# Patient Record
Sex: Female | Born: 1935 | Race: White | Hispanic: No | Smoking: Former smoker
Health system: Southern US, Community
[De-identification: ages and names within clinical notes are randomized; demographics above are authoritative.]

## PROBLEM LIST (undated history)

## (undated) DIAGNOSIS — K219 Gastro-esophageal reflux disease without esophagitis: Secondary | ICD-10-CM

## (undated) DIAGNOSIS — E785 Hyperlipidemia, unspecified: Secondary | ICD-10-CM

## (undated) DIAGNOSIS — M25469 Effusion, unspecified knee: Secondary | ICD-10-CM

## (undated) DIAGNOSIS — G4730 Sleep apnea, unspecified: Secondary | ICD-10-CM

## (undated) DIAGNOSIS — I10 Essential (primary) hypertension: Secondary | ICD-10-CM

## (undated) HISTORY — DX: Gastro-esophageal reflux disease without esophagitis: K21.9

## (undated) HISTORY — PX: CATARACT EXTRACTION: SUR2

## (undated) HISTORY — PX: CARPAL TUNNEL RELEASE: SHX101

## (undated) HISTORY — DX: Effusion, unspecified knee: M25.469

## (undated) HISTORY — DX: Sleep apnea, unspecified: G47.30

## (undated) HISTORY — DX: Hyperlipidemia, unspecified: E78.5

## (undated) HISTORY — DX: Essential (primary) hypertension: I10

---

## 2007-11-04 LAB — HM COLONOSCOPY

## 2009-10-07 ENCOUNTER — Ambulatory Visit: Payer: Self-pay | Admitting: Internal Medicine

## 2010-10-16 ENCOUNTER — Ambulatory Visit: Payer: Self-pay | Admitting: Internal Medicine

## 2011-03-25 ENCOUNTER — Ambulatory Visit: Payer: Self-pay | Admitting: Internal Medicine

## 2011-07-07 HISTORY — PX: TOTAL SHOULDER REPLACEMENT: SUR1217

## 2011-07-07 HISTORY — PX: CHOLECYSTECTOMY: SHX55

## 2012-03-22 ENCOUNTER — Ambulatory Visit: Payer: Self-pay | Admitting: Internal Medicine

## 2012-08-25 ENCOUNTER — Ambulatory Visit: Payer: Self-pay | Admitting: Internal Medicine

## 2013-03-24 ENCOUNTER — Ambulatory Visit: Payer: Self-pay | Admitting: Internal Medicine

## 2013-10-31 ENCOUNTER — Ambulatory Visit: Payer: Self-pay | Admitting: Internal Medicine

## 2014-03-20 LAB — LIPID PANEL
Cholesterol: 204 mg/dL — AB (ref 0–200)
HDL: 50 mg/dL (ref 35–70)
LDL Cholesterol: 111 mg/dL
Triglycerides: 213 mg/dL — AB (ref 40–160)

## 2014-03-20 LAB — TSH: TSH: 2 u[IU]/mL (ref <=5.90)

## 2014-03-20 LAB — CBC AND DIFFERENTIAL: Hemoglobin: 12.5 g/dL (ref 12.0–16.0)

## 2014-04-05 LAB — HM MAMMOGRAPHY: HM MAMMO: NORMAL

## 2014-04-12 ENCOUNTER — Ambulatory Visit: Payer: Self-pay | Admitting: Internal Medicine

## 2015-01-24 ENCOUNTER — Encounter: Payer: Self-pay | Admitting: Internal Medicine

## 2015-01-24 DIAGNOSIS — E210 Primary hyperparathyroidism: Secondary | ICD-10-CM | POA: Insufficient documentation

## 2015-01-24 DIAGNOSIS — I10 Essential (primary) hypertension: Secondary | ICD-10-CM | POA: Insufficient documentation

## 2015-01-24 DIAGNOSIS — M810 Age-related osteoporosis without current pathological fracture: Secondary | ICD-10-CM | POA: Insufficient documentation

## 2015-01-24 DIAGNOSIS — E782 Mixed hyperlipidemia: Secondary | ICD-10-CM | POA: Insufficient documentation

## 2015-01-24 DIAGNOSIS — R197 Diarrhea, unspecified: Secondary | ICD-10-CM | POA: Insufficient documentation

## 2015-01-24 DIAGNOSIS — K219 Gastro-esophageal reflux disease without esophagitis: Secondary | ICD-10-CM | POA: Insufficient documentation

## 2015-01-24 DIAGNOSIS — M159 Polyosteoarthritis, unspecified: Secondary | ICD-10-CM | POA: Insufficient documentation

## 2015-01-24 DIAGNOSIS — E559 Vitamin D deficiency, unspecified: Secondary | ICD-10-CM | POA: Insufficient documentation

## 2015-01-24 DIAGNOSIS — J309 Allergic rhinitis, unspecified: Secondary | ICD-10-CM | POA: Insufficient documentation

## 2015-01-25 ENCOUNTER — Ambulatory Visit (INDEPENDENT_AMBULATORY_CARE_PROVIDER_SITE_OTHER): Payer: Medicare Other | Admitting: Internal Medicine

## 2015-01-25 ENCOUNTER — Encounter: Payer: Self-pay | Admitting: Internal Medicine

## 2015-01-25 VITALS — BP 98/60 | HR 67 | Temp 98.3°F | Ht 67.0 in | Wt 180.0 lb

## 2015-01-25 DIAGNOSIS — J029 Acute pharyngitis, unspecified: Secondary | ICD-10-CM | POA: Diagnosis not present

## 2015-01-25 MED ORDER — AZITHROMYCIN 250 MG PO TABS: 250.0000 mg | ORAL_TABLET | Freq: Every day | ORAL | Status: DC

## 2015-01-25 NOTE — Progress Notes (Signed)
Date:  01/25/2015   Name:  Joanna Sanders   DOB:  11-25-1929   MRN:  [id number]   Chief Complaint: Sore Throat Sore Throat  This is a new problem. The current episode started in the past 7 days. The problem has been gradually worsening. Neither side of throat is experiencing more pain than the other. There has been no fever. The patient is experiencing no pain. Associated symptoms include congestion, coughing, headaches, a hoarse voice and shortness of breath. Pertinent negatives include no abdominal pain, diarrhea, ear discharge, ear pain, neck pain, swollen glands or trouble swallowing. She has tried acetaminophen for the symptoms. The treatment provided mild relief.     Review of Systems:  Review of Systems  Constitutional: Positive for fever. Negative for appetite change and fatigue.  HENT: Positive for congestion, hoarse voice, postnasal drip, sinus pressure and sore throat. Negative for ear discharge, ear pain, sneezing, tinnitus and trouble swallowing.   Eyes: Negative for visual disturbance.  Respiratory: Positive for cough and shortness of breath. Negative for choking and chest tightness.   Cardiovascular: Negative for chest pain.  Gastrointestinal: Negative for abdominal pain and diarrhea.  Musculoskeletal: Negative for neck pain.  Neurological: Positive for headaches.    Patient Active Problem List   Diagnosis Date Noted  . Allergic rhinitis 01/24/2015  . Benign essential HTN 01/24/2015  . HLD (hyperlipidemia) 01/24/2015  . Acid reflux 01/24/2015  . Generalized OA 01/24/2015  . Hyperparathyroidism, primary 01/24/2015  . D (diarrhea) 01/24/2015  . OP (osteoporosis) 01/24/2015  . Avitaminosis D 01/24/2015    Prior to Admission medications   Medication Sig Start Date End Date Taking? Authorizing Provider  alendronate (FOSAMAX) 70 MG tablet Take 1 tablet by mouth once a week. 11/14/14  Yes Historical Provider, MD  cholecalciferol (VITAMIN D) 1000 UNITS tablet Take 1  tablet by mouth daily.   Yes Historical Provider, MD  cholestyramine light (PREVALITE) 4 G packet Take 1 packet by mouth daily. 11/14/14  Yes Historical Provider, MD  dicyclomine (BENTYL) 10 MG capsule Take 1 capsule by mouth 3 (three) times daily. 06/22/14  Yes Historical Provider, MD  Esomeprazole Magnesium 20 MG TBEC Take 1 tablet by mouth daily.   Yes Historical Provider, MD  furosemide (LASIX) 20 MG tablet Take 1 tablet by mouth daily. 03/04/14  Yes Historical Provider, MD    Allergies  Allergen Reactions  . Hydrochlorothiazide     hypercalcemia  . Prednisone Other (See Comments)    Gets really hyper    Past Surgical History  Procedure Laterality Date  . Cholecystectomy  2013  . Carpal tunnel release Right   . Total shoulder replacement Left 2013  . Cataract extraction      History  Substance Use Topics  . Smoking status: Former Research scientist (life sciences)  . Smokeless tobacco: Not on file  . Alcohol Use: No     Medication list has been reviewed and updated.  Physical Examination:  Physical Exam  Constitutional: She appears well-developed and well-nourished.  HENT:  Right Ear: Tympanic membrane and ear canal normal.  Left Ear: Tympanic membrane and ear canal normal.  Nose: Right sinus exhibits maxillary sinus tenderness. Left sinus exhibits maxillary sinus tenderness.  Mouth/Throat: Uvula is midline. Posterior oropharyngeal erythema present. No oropharyngeal exudate or posterior oropharyngeal edema.  Cardiovascular: Normal rate, regular rhythm and S1 normal.   Pulmonary/Chest: Effort normal and breath sounds normal.  Psychiatric: She has a normal mood and affect.    BP 98/60 mmHg  Pulse 67  Temp(Src) 98.3 F (36.8 C)  Ht 5\' 7"  (1.702 m)  Wt 180 lb (81.647 kg)  BMI 28.19 kg/m2  SpO2 97%  Assessment and Plan: 1. Pharyngitis Take claritin or allegra for 5-10 days for PND - azithromycin (ZITHROMAX Z-PAK) 250 MG tablet; Take 1 tablet (250 mg total) by mouth daily. Take 2 tabs on  day #1 the 2 tablet daily until completed.  Dispense: 6 each; Refill: 0   Halina Maidens, MD [facility]  01/25/2015

## 2015-01-25 NOTE — Patient Instructions (Signed)
Get Claritin 10mg (Loratidine) or Allegra 180 mg(fexofenadine) - and take one a day for 5-10 days

## 2015-01-30 ENCOUNTER — Other Ambulatory Visit: Payer: Self-pay | Admitting: Internal Medicine

## 2015-01-30 MED ORDER — ALBUTEROL SULFATE HFA 108 (90 BASE) MCG/ACT IN AERS: 2.0000 | INHALATION_SPRAY | Freq: Four times a day (QID) | RESPIRATORY_TRACT | Status: DC | PRN

## 2015-01-30 MED ORDER — LEVOFLOXACIN 500 MG PO TABS: 500.0000 mg | ORAL_TABLET | Freq: Every day | ORAL | Status: DC

## 2015-03-12 ENCOUNTER — Other Ambulatory Visit: Payer: Self-pay | Admitting: Internal Medicine

## 2015-03-12 MED ORDER — MECLIZINE HCL 25 MG PO TABS: 25.0000 mg | ORAL_TABLET | Freq: Three times a day (TID) | ORAL | Status: DC | PRN

## 2015-03-12 MED ORDER — ONDANSETRON HCL 4 MG PO TABS: 4.0000 mg | ORAL_TABLET | Freq: Three times a day (TID) | ORAL | Status: DC | PRN

## 2015-03-19 ENCOUNTER — Other Ambulatory Visit: Payer: Self-pay | Admitting: Internal Medicine

## 2015-04-04 ENCOUNTER — Encounter: Payer: Self-pay | Admitting: Internal Medicine

## 2015-04-04 ENCOUNTER — Ambulatory Visit (INDEPENDENT_AMBULATORY_CARE_PROVIDER_SITE_OTHER): Payer: Medicare Other | Admitting: Internal Medicine

## 2015-04-04 VITALS — BP 132/70 | HR 80 | Ht 66.75 in | Wt 174.6 lb

## 2015-04-04 DIAGNOSIS — R197 Diarrhea, unspecified: Secondary | ICD-10-CM | POA: Diagnosis not present

## 2015-04-04 DIAGNOSIS — K862 Cyst of pancreas: Secondary | ICD-10-CM

## 2015-04-04 DIAGNOSIS — Z1239 Encounter for other screening for malignant neoplasm of breast: Secondary | ICD-10-CM | POA: Diagnosis not present

## 2015-04-04 DIAGNOSIS — R079 Chest pain, unspecified: Secondary | ICD-10-CM | POA: Diagnosis not present

## 2015-04-04 DIAGNOSIS — R1084 Generalized abdominal pain: Secondary | ICD-10-CM

## 2015-04-04 DIAGNOSIS — I10 Essential (primary) hypertension: Secondary | ICD-10-CM

## 2015-04-04 DIAGNOSIS — E785 Hyperlipidemia, unspecified: Secondary | ICD-10-CM

## 2015-04-04 DIAGNOSIS — Z0000 Encounter for general adult medical examination without abnormal findings: Secondary | ICD-10-CM

## 2015-04-04 NOTE — Progress Notes (Signed)
Patient: Joanna Sanders, Female    DOB: [birth year], 81 y.o.   MRN: [id number] Visit Date: 04/04/2015  Today's Provider: Halina Maidens, MD   Chief Complaint  Patient presents with  . Medicare Wellness  . Headache  . Hyperlipidemia   Subjective:    Annual wellness visit Joanna Sanders is a 81 y.o. female who presents today for her Subsequent Annual Wellness Visit. She feels fairly well. She reports exercising intermittently. She reports she is sleeping well.   ----------------------------------------------------------- Chest Pain  This is a recurrent problem. The problem occurs intermittently. The problem has been unchanged. The pain is present in the substernal region. The pain is at a severity of 0/10. The pain is mild. The quality of the pain is described as pressure. The pain does not radiate. Associated symptoms include abdominal pain and dizziness ( Intermittent vertigo). Pertinent negatives include no cough, diaphoresis, fever, headaches, nausea, numbness, palpitations, shortness of breath or vomiting. The pain is aggravated by nothing. Past medical history comments: CT scan 4 years ago showed pancreatic duct system was never followed up upon  Abdominal Pain This is a recurrent problem. The current episode started more than 1 year ago. The problem occurs every several days. The problem has been unchanged. The pain is located in the generalized abdominal region (Concentrated on the right upper quadrant). The quality of the pain is cramping and a sensation of fullness. Associated symptoms include diarrhea. Pertinent negatives include no arthralgias, dysuria, fever, headaches, hematuria, myalgias, nausea, vomiting or weight loss. Nothing aggravates the pain. The pain is relieved by nothing. Prior workup: Colonoscopy is scheduled for November. Her past medical history is significant for abdominal surgery. CT scan 4 years ago showed pancreatic duct system was never followed up upon                                 Verified   CT abdomen and pelvis with IV contrast, routine  Comparison:  None.  Indication:  NOT SPECIFIED; IV [id number] BACK UP [id number].  Technique:  CT imaging was performed of the abdomen and pelvis following the uncomplicated administration of intravenous contrast (Isovue-300, 150 mL at 3 mL/sec).  Iodinated contrast was used due to the indications for the examination.   If IV contrast material had not been administered, the likelihood of detecting abnormalities relevant to the patients condition would have been substantially decreased.  The most recent serum creatinine is 0.7 mg/dL.   Coronal reformatted images were generated and reviewed to improve anatomic localization and optimize lesion detection.  Findings: Mild dependent opacities are seen at the lung bases, likely representing atelectasis. The heart is normal in size. No pericardial effusion is seen. There is a small hiatal hernia noted.  The liver demonstrates a normal size and contour. There is a small amount of focal fat noted along the falciform ligament. Subtle hyperenhancement in the dome of the liver, best seen on coronal imaging, is likely perfusional. No focal hepatic lesions are identified. The portal and hepatic veins are patent.  The spleen, adrenals, and kidneys are unremarkable. There is no evidence of hydronephrosis. A pancreatic divisum versus dilated accessory pancreatic duct is noted. No cystic lesion identified.  The aorta, celiac, SMA and IMA are patent and of normal caliber. Mild to moderate atheromatous plaque is identified along the course of the infrarenal abdominal aorta. The SMV and splenic vein are patent. No free fluid, free air, or pathologically enlarged  adenopathy is identified.  The small bowel demonstrates a normal caliber and is without evidence of bowel obstruction. The appendix is visualized in the right lower quadrant and is within normal limits.  Scattered colonic diverticula are identified. There is short-segment inflammatory stranding surrounding the proximal sigmoid/distal descending colon, concerning for diverticulitis. There is no evidence of extraluminal free air. The loculation of gas on image 58 is felt to be within a diverticulum. Additionally, there is no evidence of an adjacent abscess.  The uterus and ovaries are unremarkable. No adnexal masses are seen. Calcified phleboliths are noted in the pelvis. The bladder is partially distended.  Mild multilevel degenerative changes are noted throughout the spine.  Impression:  1. Findings compatible with uncomplicated, short segment diverticulitis of the proximal sigmoid/distal descending colon. No evidence of associated perforation or abscess formation. Recommend colonoscopy following clinical treatment of the diverticulitis to ensure absence of underlying colonic mass.  2. Pancreatic divisum versus dilated accessory pancreatic duct. MRCP may be obtained for further evaluation as clinically warranted.    The above was discussed with Dr. Araceli Bouche at 5:33 PM by Dr. Kathrynn Running.  The preliminary report (critical or emergent communication) was reviewed prior to this dictation and there are no substantial differences between the preliminary results and the impressions in this final report.  I have reviewed the images and concur with the above findings.  Electronically Reviewed by:  Mathis Bud, MD Electronically Reviewed on:  06/14/2011 8:56 AM  Electronically Signed by:  Salome Holmes, MD Electronically Signed on:  06/14/2011 11:33 AM    Result ID: [id number]   Review of Systems  Constitutional: Negative for fever, chills, weight loss, diaphoresis and fatigue.  HENT: Negative for hearing loss, sore throat and trouble swallowing.   Eyes: Negative for visual disturbance.  Respiratory: Negative for cough, shortness of breath and wheezing.   Cardiovascular: Positive  for chest pain. Negative for palpitations and leg swelling.  Gastrointestinal: Positive for abdominal pain and diarrhea. Negative for nausea, vomiting and blood in stool.  Genitourinary: Negative for dysuria and hematuria.  Musculoskeletal: Negative for myalgias, joint swelling and arthralgias.  Skin: Negative for color change and rash.  Neurological: Positive for dizziness ( Intermittent vertigo). Negative for tremors, syncope, light-headedness, numbness and headaches.  Hematological: Negative for adenopathy.  Psychiatric/Behavioral: Negative for sleep disturbance, dysphoric mood and decreased concentration. The patient is not nervous/anxious.     Social History   Social History  . Marital Status: Widowed    Spouse Name: N/A  . Number of Children: N/A  . Years of Education: N/A   Occupational History  . Not on file.   Social History Main Topics  . Smoking status: Former Research scientist (life sciences)  . Smokeless tobacco: Not on file  . Alcohol Use: No  . Drug Use: No  . Sexual Activity: Not on file   Other Topics Concern  . Not on file   Social History Narrative    Patient Active Problem List   Diagnosis Date Noted  . Allergic rhinitis 01/24/2015  . Benign essential HTN 01/24/2015  . HLD (hyperlipidemia) 01/24/2015  . Acid reflux 01/24/2015  . Generalized OA 01/24/2015  . Hyperparathyroidism, primary 01/24/2015  . D (diarrhea) 01/24/2015  . OP (osteoporosis) 01/24/2015  . Avitaminosis D 01/24/2015    Past Surgical History  Procedure Laterality Date  . Cholecystectomy  2013  . Carpal tunnel release Right   . Total shoulder replacement Left 2013  . Cataract extraction      Her family history includes  Heart failure in her father; Hypertension in her mother.    Previous Medications   ALBUTEROL (PROVENTIL HFA;VENTOLIN HFA) 108 (90 BASE) MCG/ACT INHALER    Inhale 2 puffs into the lungs every 6 (six) hours as needed for wheezing or shortness of breath.   ALENDRONATE (FOSAMAX) 70 MG  TABLET    Take 1 tablet by mouth once a week.   CHOLECALCIFEROL (VITAMIN D) 1000 UNITS TABLET    Take 1 tablet by mouth daily.   CHOLESTYRAMINE LIGHT (PREVALITE) 4 G PACKET    Take 1 packet by mouth daily.   DICYCLOMINE (BENTYL) 10 MG CAPSULE    Take 1 capsule by mouth 3 (three) times daily.   ESOMEPRAZOLE MAGNESIUM 20 MG TBEC    Take 1 tablet by mouth daily.   FUROSEMIDE (LASIX) 20 MG TABLET    TAKE 1 TABLET BY MOUTH DAILY.   MECLIZINE (ANTIVERT) 25 MG TABLET    Take 1 tablet (25 mg total) by mouth 3 (three) times daily as needed.   ONDANSETRON (ZOFRAN) 4 MG TABLET    Take 1 tablet (4 mg total) by mouth every 8 (eight) hours as needed for nausea or vomiting.   [place]  Component Name Value Range  Sodium 139 135 - 145 mmol/L  Potassium 4.2 3.5 - 5.0 mmol/L  Chloride 103 98 - 108 mmol/L  Carbon Dioxide (CO2) 27 21 - 30 mmol/L  Urea Nitrogen (BUN) 9 7 - 20 mg/dL  Creatinine 0.7 0.4 - 1.0 mg/dL  Calcium 10.6 (H) 8.7 - 10.2 mg/dL  Phosphorus 2.5 2.3 - 4.5 mg/dL  Albumin 4.1 3.5 - 4.8 g/dL  Glomerular Filtration Rate (eGFR), MDRD Estimate >60 Comment:  Interpretive Ranges for Patients with Chronic Kidney Disease:  eGFR:      > 60 mL/min - Normal eGFR:      30 - 59 mL/min - Moderately Decreased eGFR:      15 - 29 mL/min - Severely Decreased eGFR:      < 15 mL/min -  Kidney Failure  Note: These GFR calculations do not apply in acute situations  when GFR is changing rapidly or in patients on dialysis. mL/min/1.73sq m   Glucose 99 Comment:  Interpretive Data:  Please note that the above listed reference range                    is for NONFASTING GLUCOSE levels only.  FASTING GLUCOSE                        reference ranges are shown below:                                              FASTING GLUCOSE REFERENCE RANGE                                      NORMAL:             70 - 99  mg/dL                                   PREDIABETES:       100 - 125 mg/dL  DIABETES:          > 125     mg/dL                                                                                                                                                                                                       70 - 140 mg/dL  Anion Gap 9 3 - 12 mmol/L  BUN/CREA Ratio 13     Patient Care Team: Glean Hess, MD as PCP - General (Internal Medicine) Farley Ly, MD as Referring Physician (Endocrinology)     Objective:   Vitals: BP 132/70 mmHg  Pulse 80  Ht 5' 6.75" (1.695 m)  Wt 174 lb 9.6 oz (79.198 kg)  BMI 27.57 kg/m2  Physical Exam  Constitutional: She is oriented to person, place, and time. She appears well-developed and well-nourished. No distress.  HENT:  Head: Normocephalic and atraumatic.  Right Ear: Tympanic membrane and ear canal normal.  Left Ear: Tympanic membrane and ear canal normal.  Nose: Nose normal.  Mouth/Throat: Uvula is midline and oropharynx is clear and moist.  Eyes: Conjunctivae are normal. Pupils are equal, round, and reactive to light. Right eye exhibits no discharge. Left eye exhibits no discharge. No scleral icterus.  Neck: Normal range of motion. Neck supple. No tracheal tenderness present. Carotid bruit is not present. No thyromegaly present.  Cardiovascular: Normal rate, regular rhythm, normal heart sounds and normal pulses.   Pulmonary/Chest: Effort normal and breath sounds normal. No respiratory distress. She exhibits no bony tenderness and no retraction. Right breast exhibits no mass, no nipple discharge, no skin change and no tenderness. Left breast exhibits no mass, no nipple discharge, no skin change and no tenderness.  Abdominal: Soft. Normal appearance and bowel sounds are normal. There is no hepatosplenomegaly. There is generalized tenderness. There is no rigidity, no guarding and no CVA tenderness.  Musculoskeletal: Normal range of motion.  Lymphadenopathy:    She has no cervical adenopathy.   Neurological: She is alert and oriented to person, place, and time. She displays no tremor. Gait normal.  Skin: Skin is warm, dry and intact. No rash noted.  Psychiatric: She has a normal mood and affect. Her behavior is normal. Thought content normal.  Nursing note and vitals reviewed.   Activities of Daily Living In your present state of health, do you have any difficulty performing the following activities: 01/25/2015  Hearing? Y  Vision? N  Difficulty concentrating or making decisions? N  Walking or climbing stairs? N  Dressing or bathing? N  Doing errands, shopping?  N    Fall Risk Assessment Fall Risk  01/25/2015  Falls in the past year? No     Patient reports there are safety devices in place in shower at home.   Depression Screen PHQ 2/9 Scores 01/25/2015  PHQ - 2 Score 0    Cognitive Testing - 6-CIT   Correct? Score   What year is it? yes 0 Yes = 0    No = 4  What month is it? yes 0 Yes = 0    No = 3  Remember:     Pia Mau, [place], [place]     What time is it? yes 0 Yes = 0    No = 3  Count backwards from 20 to 1 yes 0 Correct = 0    1 error = 2   More than 1 error = 4  Say the months of the year in reverse. yes 0 Correct = 0    1 error = 2   More than 1 error = 4  What address did I ask you to remember? yes 1 Correct = 0  1 error = 2    2 error = 4    3 error = 6    4 error = 8    All wrong = 10       TOTAL SCORE  27/28   Interpretation:  Normal  Normal (0-7) Abnormal (8-28)        Assessment & Plan:     Annual Wellness Visit  Reviewed patient's Family Medical History Reviewed and updated list of patient's medical providers Assessment of cognitive impairment was done Assessed patient's functional ability Established a written schedule for health screening [place] Completed and Reviewed  Exercise Activities and Dietary recommendations Goals    None      Immunization History  Administered Date(s) Administered   . Influenza-Unspecified 02/18/2015  . Pneumococcal Conjugate-13 03/20/2014  . Pneumococcal Polysaccharide-23 07/07/2005  . Zoster 07/08/2007    Health Maintenance  Topic Date Due  . Samul Dada  03/15/1949  . DEXA SCAN  03/14/1997  . INFLUENZA VACCINE  02/04/2015  . ZOSTAVAX  Completed  . PNA vac Low Risk Adult  Completed     Discussed health benefits of physical activity, and encouraged her to engage in regular exercise appropriate for her age and condition.    ------------------------------------------------------------------------------------------------------------  1. Medicare annual wellness visit, subsequent Medicare wellness measures satisfied  2. Chest pain, unspecified chest pain type Diagnoses musculoskeletal since EKG is normal patient will follow up if symptoms worsen - EKG 12-Lead  3. Breast cancer screening Schedule annual mammogram - MM DIGITAL SCREENING BILATERAL; Future  4. Benign essential HTN Doing well off medication - CBC with Differential/Platelet - TSH  5. HLD (hyperlipidemia) Continue low-fat diet - Lipid panel  6. D (diarrhea) S/P Cholecystectomy Continue cholestyramine Follow-up colonoscopy planned for November  7. Abdominal pain, generalized Previous CT reviewed showing an abnormality in the pancreatic duct With generalized abdominal pain need to also exclude peptic ulcer disease - Amylase - Lipase - H. pylori antibody, IgG  8. Pancreatic cyst Obtain ultrasound to follow-up on previous abnormality - US Abdomen Complete; Future - Amylase - Lipase  Halina Maidens, MD [facility]  04/04/2015

## 2015-04-04 NOTE — Patient Instructions (Signed)
Health Maintenance  Topic Date Due  . TETANUS/TDAP  03/15/1949  . DEXA SCAN  03/14/1997  . INFLUENZA VACCINE  02/04/2016  . ZOSTAVAX  Completed  . PNA vac Low Risk Adult  Completed

## 2015-04-05 LAB — LIPID PANEL
CHOL/HDL RATIO: 3.4 ratio (ref 0.0–4.4)
CHOLESTEROL TOTAL: 198 mg/dL (ref 100–199)
HDL: 58 mg/dL (ref >39)
LDL Calculated: 92 mg/dL (ref 0–99)
TRIGLYCERIDES: 239 mg/dL — AB (ref 0–149)
VLDL Cholesterol Cal: 48 mg/dL — ABNORMAL HIGH (ref 5–40)

## 2015-04-05 LAB — CBC WITH DIFFERENTIAL/PLATELET
BASOS ABS: 0 10*3/uL (ref 0.0–0.2)
Basos: 1 %
EOS (ABSOLUTE): 0.1 10*3/uL (ref 0.0–0.4)
Eos: 2 %
HEMATOCRIT: 36.8 % (ref 34.0–46.6)
Hemoglobin: 12.5 g/dL (ref 11.1–15.9)
IMMATURE GRANULOCYTES: 0 %
Immature Grans (Abs): 0 10*3/uL (ref 0.0–0.1)
LYMPHS ABS: 1.7 10*3/uL (ref 0.7–3.1)
Lymphs: 31 %
MCH: 31.6 pg (ref 26.6–33.0)
MCHC: 34 g/dL (ref 31.5–35.7)
MCV: 93 fL (ref 79–97)
Monocytes Absolute: 0.6 10*3/uL (ref 0.1–0.9)
Monocytes: 11 %
Neutrophils Absolute: 2.9 10*3/uL (ref 1.4–7.0)
Neutrophils: 55 %
PLATELETS: 231 10*3/uL (ref 150–379)
RBC: 3.96 x10E6/uL (ref 3.77–5.28)
RDW: 14.2 % (ref 12.3–15.4)
WBC: 5.3 10*3/uL (ref 3.4–10.8)

## 2015-04-05 LAB — LIPASE: LIPASE: 30 U/L (ref 0–59)

## 2015-04-05 LAB — TSH: TSH: 1.45 u[IU]/mL (ref 0.450–4.500)

## 2015-04-05 LAB — AMYLASE: Amylase: 48 U/L (ref 31–124)

## 2015-04-05 LAB — H. PYLORI ANTIBODY, IGG

## 2015-04-08 ENCOUNTER — Ambulatory Visit: Payer: Self-pay

## 2015-04-09 ENCOUNTER — Ambulatory Visit
Admission: RE | Admit: 2015-04-09 | Discharge: 2015-04-09 | Disposition: A | Discharge status: Home / Self Care | Payer: Medicare Other | Source: Ambulatory Visit | Attending: Internal Medicine | Admitting: Internal Medicine

## 2015-04-09 ENCOUNTER — Other Ambulatory Visit: Payer: Self-pay | Admitting: Internal Medicine

## 2015-04-09 DIAGNOSIS — K8689 Other specified diseases of pancreas: Secondary | ICD-10-CM | POA: Insufficient documentation

## 2015-04-09 DIAGNOSIS — K862 Cyst of pancreas: Secondary | ICD-10-CM | POA: Diagnosis present

## 2015-04-09 DIAGNOSIS — R1084 Generalized abdominal pain: Secondary | ICD-10-CM | POA: Insufficient documentation

## 2015-04-09 DIAGNOSIS — Z9049 Acquired absence of other specified parts of digestive tract: Secondary | ICD-10-CM | POA: Insufficient documentation

## 2015-04-09 DIAGNOSIS — K760 Fatty (change of) liver, not elsewhere classified: Secondary | ICD-10-CM | POA: Insufficient documentation

## 2015-04-09 DIAGNOSIS — R1011 Right upper quadrant pain: Secondary | ICD-10-CM

## 2015-05-01 ENCOUNTER — Ambulatory Visit
Admission: RE | Admit: 2015-05-01 | Discharge: 2015-05-01 | Disposition: A | Discharge status: Home / Self Care | Payer: Medicare Other | Source: Ambulatory Visit | Attending: Internal Medicine | Admitting: Internal Medicine

## 2015-05-01 DIAGNOSIS — Z1231 Encounter for screening mammogram for malignant neoplasm of breast: Secondary | ICD-10-CM | POA: Insufficient documentation

## 2015-05-01 DIAGNOSIS — Z1239 Encounter for other screening for malignant neoplasm of breast: Secondary | ICD-10-CM

## 2015-06-03 ENCOUNTER — Ambulatory Visit (INDEPENDENT_AMBULATORY_CARE_PROVIDER_SITE_OTHER): Payer: Medicare Other | Admitting: Internal Medicine

## 2015-06-03 ENCOUNTER — Encounter: Payer: Self-pay | Admitting: Internal Medicine

## 2015-06-03 VITALS — BP 120/60 | HR 76 | Ht 66.75 in | Wt 179.4 lb

## 2015-06-03 DIAGNOSIS — N3000 Acute cystitis without hematuria: Secondary | ICD-10-CM

## 2015-06-03 DIAGNOSIS — R300 Dysuria: Secondary | ICD-10-CM | POA: Diagnosis not present

## 2015-06-03 LAB — POCT URINALYSIS DIPSTICK
BILIRUBIN UA: NEGATIVE
GLUCOSE UA: NEGATIVE
Ketones, UA: NEGATIVE
NITRITE UA: POSITIVE
PH UA: 5
Protein, UA: NEGATIVE
Spec Grav, UA: <=1.005
Urobilinogen, UA: 0.2

## 2015-06-03 MED ORDER — CIPROFLOXACIN HCL 250 MG PO TABS: 250.0000 mg | ORAL_TABLET | Freq: Two times a day (BID) | ORAL | Status: DC

## 2015-06-03 NOTE — Progress Notes (Signed)
Date:  06/03/2015   Name:  Joanna Sanders   DOB:  1930/04/28   MRN:  [id number]   Chief Complaint: Urinary Tract Infection Urinary Tract Infection  This is a new problem. The current episode started yesterday. The problem occurs every urination. The problem has been gradually worsening. There has been no fever. She is not sexually active. Associated symptoms include frequency, hesitancy and urgency. Pertinent negatives include no chills, flank pain or hematuria. She has tried increased fluids for the symptoms.    Review of Systems  Constitutional: Negative for chills.  Genitourinary: Positive for hesitancy, urgency and frequency. Negative for hematuria and flank pain.    Patient Active Problem List   Diagnosis Date Noted  . Allergic rhinitis 01/24/2015  . Benign essential HTN 01/24/2015  . HLD (hyperlipidemia) 01/24/2015  . Acid reflux 01/24/2015  . Generalized OA 01/24/2015  . Hyperparathyroidism, primary ([place]) 01/24/2015  . D (diarrhea) 01/24/2015  . OP (osteoporosis) 01/24/2015  . Avitaminosis D 01/24/2015    Prior to Admission medications   Medication Sig Start Date End Date Taking? Authorizing Provider  albuterol (PROVENTIL HFA;VENTOLIN HFA) 108 (90 BASE) MCG/ACT inhaler Inhale 2 puffs into the lungs every 6 (six) hours as needed for wheezing or shortness of breath. 01/30/15  Yes Glean Hess, MD  alendronate (FOSAMAX) 70 MG tablet Take 1 tablet by mouth once a week. 11/14/14  Yes Historical Provider, MD  cholecalciferol (VITAMIN D) 1000 UNITS tablet Take 1 tablet by mouth daily.   Yes Historical Provider, MD  cholestyramine light (PREVALITE) 4 G packet Take 1 packet by mouth daily. 11/14/14  Yes Historical Provider, MD  Esomeprazole Magnesium 20 MG TBEC Take 1 tablet by mouth daily.   Yes Historical Provider, MD  furosemide (LASIX) 20 MG tablet TAKE 1 TABLET BY MOUTH DAILY. 03/19/15  Yes Glean Hess, MD  dicyclomine (BENTYL) 10 MG capsule Take 1 capsule by mouth 3  (three) times daily. 06/22/14   Historical Provider, MD  meclizine (ANTIVERT) 25 MG tablet Take 1 tablet (25 mg total) by mouth 3 (three) times daily as needed. Patient not taking: Reported on 06/03/2015 03/12/15   Glean Hess, MD  ondansetron (ZOFRAN) 4 MG tablet Take 1 tablet (4 mg total) by mouth every 8 (eight) hours as needed for nausea or vomiting. Patient not taking: Reported on 06/03/2015 03/12/15   Glean Hess, MD    Allergies  Allergen Reactions  . Hydrochlorothiazide     hypercalcemia  . Prednisone Other (See Comments)    Gets really hyper    Past Surgical History  Procedure Laterality Date  . Cholecystectomy  2013  . Carpal tunnel release Right   . Total shoulder replacement Left 2013  . Cataract extraction      Social History  Substance Use Topics  . Smoking status: Former Research scientist (life sciences)  . Smokeless tobacco: None  . Alcohol Use: No     Medication list has been reviewed and updated.   Physical Exam  Constitutional: She is oriented to person, place, and time. She appears well-developed and well-nourished. No distress.  HENT:  Head: Normocephalic and atraumatic.  Eyes: Right eye exhibits no discharge. Left eye exhibits no discharge. No scleral icterus.  Cardiovascular: Normal rate, regular rhythm and normal heart sounds.   Pulmonary/Chest: Effort normal and breath sounds normal. No respiratory distress.  Abdominal: Soft. Bowel sounds are normal. There is tenderness. There is no rebound and no guarding.  Musculoskeletal: Normal range of motion. She exhibits no edema.  Neurological: She is alert and oriented to person, place, and time.  Skin: Skin is warm and dry. No rash noted.  Psychiatric: She has a normal mood and affect. Her behavior is normal. Thought content normal.    BP 120/60 mmHg  Pulse 76  Ht 5' 6.75" (1.695 m)  Wt 179 lb 6.4 oz (81.375 kg)  BMI 28.32 kg/m2  Assessment and Plan: 1. Acute cystitis without hematuria Increase fluids -  ciprofloxacin (CIPRO) 250 MG tablet; Take 1 tablet (250 mg total) by mouth 2 (two) times daily.  Dispense: 20 tablet; Refill: 0  2. Dysuria - POCT urinalysis dipstick   Halina Maidens, MD [facility]  06/03/2015

## 2015-08-12 DIAGNOSIS — M13811 Other specified arthritis, right shoulder: Secondary | ICD-10-CM | POA: Diagnosis not present

## 2015-08-26 DIAGNOSIS — M13811 Other specified arthritis, right shoulder: Secondary | ICD-10-CM | POA: Diagnosis not present

## 2015-08-26 DIAGNOSIS — M25511 Pain in right shoulder: Secondary | ICD-10-CM | POA: Diagnosis not present

## 2015-09-02 DIAGNOSIS — R6889 Other general symptoms and signs: Secondary | ICD-10-CM | POA: Diagnosis not present

## 2015-09-02 DIAGNOSIS — S0101XA Laceration without foreign body of scalp, initial encounter: Secondary | ICD-10-CM | POA: Diagnosis not present

## 2015-09-02 DIAGNOSIS — R9431 Abnormal electrocardiogram [ECG] [EKG]: Secondary | ICD-10-CM | POA: Diagnosis not present

## 2015-09-02 DIAGNOSIS — W1830XA Fall on same level, unspecified, initial encounter: Secondary | ICD-10-CM | POA: Diagnosis not present

## 2015-09-02 DIAGNOSIS — Z043 Encounter for examination and observation following other accident: Secondary | ICD-10-CM | POA: Diagnosis not present

## 2015-09-02 DIAGNOSIS — S0281XA Fracture of other specified skull and facial bones, right side, initial encounter for closed fracture: Secondary | ICD-10-CM | POA: Diagnosis not present

## 2015-09-02 DIAGNOSIS — S0291XA Unspecified fracture of skull, initial encounter for closed fracture: Secondary | ICD-10-CM | POA: Diagnosis not present

## 2015-09-02 DIAGNOSIS — S0292XA Unspecified fracture of facial bones, initial encounter for closed fracture: Secondary | ICD-10-CM | POA: Diagnosis not present

## 2015-09-02 DIAGNOSIS — S0990XA Unspecified injury of head, initial encounter: Secondary | ICD-10-CM | POA: Diagnosis not present

## 2015-09-02 DIAGNOSIS — S0191XA Laceration without foreign body of unspecified part of head, initial encounter: Secondary | ICD-10-CM | POA: Diagnosis not present

## 2015-09-02 DIAGNOSIS — S0219XA Other fracture of base of skull, initial encounter for closed fracture: Secondary | ICD-10-CM | POA: Diagnosis not present

## 2015-09-02 DIAGNOSIS — W19XXXA Unspecified fall, initial encounter: Secondary | ICD-10-CM | POA: Diagnosis not present

## 2015-09-02 DIAGNOSIS — H1131 Conjunctival hemorrhage, right eye: Secondary | ICD-10-CM | POA: Diagnosis not present

## 2015-09-02 DIAGNOSIS — S022XXA Fracture of nasal bones, initial encounter for closed fracture: Secondary | ICD-10-CM | POA: Diagnosis not present

## 2015-09-02 DIAGNOSIS — W1809XA Striking against other object with subsequent fall, initial encounter: Secondary | ICD-10-CM | POA: Diagnosis not present

## 2015-09-02 DIAGNOSIS — W1839XA Other fall on same level, initial encounter: Secondary | ICD-10-CM | POA: Diagnosis not present

## 2015-09-02 DIAGNOSIS — S0081XA Abrasion of other part of head, initial encounter: Secondary | ICD-10-CM | POA: Diagnosis not present

## 2015-09-02 DIAGNOSIS — Z87891 Personal history of nicotine dependence: Secondary | ICD-10-CM | POA: Diagnosis not present

## 2015-09-02 DIAGNOSIS — R0789 Other chest pain: Secondary | ICD-10-CM | POA: Diagnosis not present

## 2015-09-02 DIAGNOSIS — S0181XA Laceration without foreign body of other part of head, initial encounter: Secondary | ICD-10-CM | POA: Diagnosis not present

## 2015-09-02 DIAGNOSIS — I517 Cardiomegaly: Secondary | ICD-10-CM | POA: Diagnosis not present

## 2015-09-03 DIAGNOSIS — S0292XA Unspecified fracture of facial bones, initial encounter for closed fracture: Secondary | ICD-10-CM | POA: Diagnosis not present

## 2015-09-03 DIAGNOSIS — R0789 Other chest pain: Secondary | ICD-10-CM | POA: Diagnosis not present

## 2015-09-03 DIAGNOSIS — I517 Cardiomegaly: Secondary | ICD-10-CM | POA: Diagnosis not present

## 2015-09-03 DIAGNOSIS — R9431 Abnormal electrocardiogram [ECG] [EKG]: Secondary | ICD-10-CM | POA: Diagnosis not present

## 2015-09-03 DIAGNOSIS — S0281XA Fracture of other specified skull and facial bones, right side, initial encounter for closed fracture: Secondary | ICD-10-CM | POA: Diagnosis not present

## 2015-09-03 DIAGNOSIS — W19XXXA Unspecified fall, initial encounter: Secondary | ICD-10-CM | POA: Diagnosis not present

## 2015-09-03 DIAGNOSIS — S022XXA Fracture of nasal bones, initial encounter for closed fracture: Secondary | ICD-10-CM | POA: Diagnosis not present

## 2015-09-03 DIAGNOSIS — Z87891 Personal history of nicotine dependence: Secondary | ICD-10-CM | POA: Diagnosis not present

## 2015-09-03 DIAGNOSIS — S0081XA Abrasion of other part of head, initial encounter: Secondary | ICD-10-CM | POA: Diagnosis not present

## 2015-09-03 DIAGNOSIS — S0101XA Laceration without foreign body of scalp, initial encounter: Secondary | ICD-10-CM | POA: Diagnosis not present

## 2015-09-03 DIAGNOSIS — S0219XA Other fracture of base of skull, initial encounter for closed fracture: Secondary | ICD-10-CM | POA: Diagnosis not present

## 2015-09-03 DIAGNOSIS — W1809XA Striking against other object with subsequent fall, initial encounter: Secondary | ICD-10-CM | POA: Diagnosis not present

## 2015-09-03 DIAGNOSIS — S0291XA Unspecified fracture of skull, initial encounter for closed fracture: Secondary | ICD-10-CM | POA: Diagnosis not present

## 2015-09-04 DIAGNOSIS — S0219XD Other fracture of base of skull, subsequent encounter for fracture with routine healing: Secondary | ICD-10-CM | POA: Insufficient documentation

## 2015-09-04 DIAGNOSIS — W19XXXD Unspecified fall, subsequent encounter: Secondary | ICD-10-CM | POA: Diagnosis not present

## 2015-09-09 DIAGNOSIS — S0181XD Laceration without foreign body of other part of head, subsequent encounter: Secondary | ICD-10-CM | POA: Diagnosis not present

## 2015-09-19 DIAGNOSIS — S0219XD Other fracture of base of skull, subsequent encounter for fracture with routine healing: Secondary | ICD-10-CM | POA: Diagnosis not present

## 2015-09-23 ENCOUNTER — Other Ambulatory Visit: Payer: Self-pay | Admitting: Internal Medicine

## 2015-09-25 DIAGNOSIS — S0219XD Other fracture of base of skull, subsequent encounter for fracture with routine healing: Secondary | ICD-10-CM | POA: Diagnosis not present

## 2015-09-25 DIAGNOSIS — Y92099 Unspecified place in other non-institutional residence as the place of occurrence of the external cause: Secondary | ICD-10-CM | POA: Diagnosis not present

## 2015-09-25 DIAGNOSIS — W19XXXD Unspecified fall, subsequent encounter: Secondary | ICD-10-CM | POA: Diagnosis not present

## 2015-09-26 ENCOUNTER — Encounter: Payer: Self-pay | Admitting: Internal Medicine

## 2015-09-26 ENCOUNTER — Other Ambulatory Visit: Payer: Self-pay | Admitting: Internal Medicine

## 2015-09-26 ENCOUNTER — Ambulatory Visit (INDEPENDENT_AMBULATORY_CARE_PROVIDER_SITE_OTHER): Payer: Medicare HMO | Admitting: Internal Medicine

## 2015-09-26 VITALS — BP 130/80 | HR 72 | Ht 66.0 in | Wt 176.0 lb

## 2015-09-26 DIAGNOSIS — S022XXA Fracture of nasal bones, initial encounter for closed fracture: Secondary | ICD-10-CM

## 2015-09-26 DIAGNOSIS — Z23 Encounter for immunization: Secondary | ICD-10-CM | POA: Diagnosis not present

## 2015-09-26 DIAGNOSIS — S0219XA Other fracture of base of skull, initial encounter for closed fracture: Secondary | ICD-10-CM | POA: Diagnosis not present

## 2015-09-26 NOTE — Patient Instructions (Signed)
Tdap Vaccine (Tetanus, Diphtheria and Pertussis): What You Need to Know 1. Why get vaccinated? Tetanus, diphtheria and pertussis are very serious diseases. Tdap vaccine can protect us from these diseases. And, Tdap vaccine given to pregnant women can protect newborn babies against pertussis. TETANUS (Lockjaw) is rare in the [place] today. It causes painful muscle tightening and stiffness, usually all over the body.  It can lead to tightening of muscles in the head and neck so you can't open your mouth, swallow, or sometimes even breathe. Tetanus kills about 1 out of 10 people who are infected even after receiving the best medical care. DIPHTHERIA is also rare in the [place] today. It can cause a thick coating to form in the back of the throat.  It can lead to breathing problems, heart failure, paralysis, and death. PERTUSSIS (Whooping Cough) causes severe coughing spells, which can cause difficulty breathing, vomiting and disturbed sleep.  It can also lead to weight loss, incontinence, and rib fractures. Up to 2 in 100 adolescents and 5 in 100 adults with pertussis are hospitalized or have complications, which could include pneumonia or death. These diseases are caused by bacteria. Diphtheria and pertussis are spread from person to person through secretions from coughing or sneezing. Tetanus enters the body through cuts, scratches, or wounds. Before vaccines, as many as 200,000 cases of diphtheria, 200,000 cases of pertussis, and hundreds of cases of tetanus, were reported in the [place] each year. Since vaccination began, reports of cases for tetanus and diphtheria have dropped by about 99% and for pertussis by about 80%. 2. Tdap vaccine Tdap vaccine can protect adolescents and adults from tetanus, diphtheria, and pertussis. One dose of Tdap is routinely given at age 11 or 12. People who did not get Tdap at that age should get it as soon as possible. Tdap is especially important  for healthcare professionals and anyone having close contact with a baby younger than 12 months. Pregnant women should get a dose of Tdap during every pregnancy, to protect the newborn from pertussis. Infants are most at risk for severe, life-threatening complications from pertussis. Another vaccine, called Td, protects against tetanus and diphtheria, but not pertussis. A Td booster should be given every 10 years. Tdap may be given as one of these boosters if you have never gotten Tdap before. Tdap may also be given after a severe cut or burn to prevent tetanus infection. Your doctor or the person giving you the vaccine can give you more information. Tdap may safely be given at the same time as other vaccines. 3. Some people should not get this vaccine  A person who has ever had a life-threatening allergic reaction after a previous dose of any diphtheria, tetanus or pertussis containing vaccine, OR has a severe allergy to any part of this vaccine, should not get Tdap vaccine. Tell the person giving the vaccine about any severe allergies.  Anyone who had coma or long repeated seizures within 7 days after a childhood dose of DTP or DTaP, or a previous dose of Tdap, should not get Tdap, unless a cause other than the vaccine was found. They can still get Td.  Talk to your doctor if you:  have seizures or another nervous system problem,  had severe pain or swelling after any vaccine containing diphtheria, tetanus or pertussis,  ever had a condition called Guillain-Barr Syndrome (GBS),  aren't feeling well on the day the shot is scheduled. 4. Risks With any medicine, including vaccines, there is   a chance of side effects. These are usually mild and go away on their own. Serious reactions are also possible but are rare. Most people who get Tdap vaccine do not have any problems with it. Mild problems following Tdap (Did not interfere with activities)  Pain where the shot was given (about 3 in 4  adolescents or 2 in 3 adults)  Redness or swelling where the shot was given (about 1 person in 5)  Mild fever of at least 100.4F (up to about 1 in 25 adolescents or 1 in 100 adults)  Headache (about 3 or 4 people in 10)  Tiredness (about 1 person in 3 or 4)  Nausea, vomiting, diarrhea, stomach ache (up to 1 in 4 adolescents or 1 in 10 adults)  Chills, sore joints (about 1 person in 10)  Body aches (about 1 person in 3 or 4)  Rash, swollen glands (uncommon) Moderate problems following Tdap (Interfered with activities, but did not require medical attention)  Pain where the shot was given (up to 1 in 5 or 6)  Redness or swelling where the shot was given (up to about 1 in 16 adolescents or 1 in 12 adults)  Fever over 102F (about 1 in 100 adolescents or 1 in 250 adults)  Headache (about 1 in 7 adolescents or 1 in 10 adults)  Nausea, vomiting, diarrhea, stomach ache (up to 1 or 3 people in 100)  Swelling of the entire arm where the shot was given (up to about 1 in 500). Severe problems following Tdap (Unable to perform usual activities; required medical attention)  Swelling, severe pain, bleeding and redness in the arm where the shot was given (rare). Problems that could happen after any vaccine:  People sometimes faint after a medical procedure, including vaccination. Sitting or lying down for about 15 minutes can help prevent fainting, and injuries caused by a fall. Tell your doctor if you feel dizzy, or have vision changes or ringing in the ears.  Some people get severe pain in the shoulder and have difficulty moving the arm where a shot was given. This happens very rarely.  Any medication can cause a severe allergic reaction. Such reactions from a vaccine are very rare, estimated at fewer than 1 in a million doses, and would happen within a few minutes to a few hours after the vaccination. As with any medicine, there is a very remote chance of a vaccine causing a serious  injury or death. The safety of vaccines is always being monitored. For more information, visit: www.cdc.gov/vaccinesafety/ 5. What if there is a serious problem? What should I look for?  Look for anything that concerns you, such as signs of a severe allergic reaction, very high fever, or unusual behavior.  Signs of a severe allergic reaction can include hives, swelling of the face and throat, difficulty breathing, a fast heartbeat, dizziness, and weakness. These would usually start a few minutes to a few hours after the vaccination. What should I do?  If you think it is a severe allergic reaction or other emergency that can't wait, call 9-1-1 or get the person to the nearest hospital. Otherwise, call your doctor.  Afterward, the reaction should be reported to the Vaccine Adverse Event Reporting System (VAERS). Your doctor might file this report, or you can do it yourself through the VAERS web site at www.vaers.hhs.gov, or by calling 1-800-822-7967. VAERS does not give medical advice.  6. The National Vaccine Injury Compensation Program The National Vaccine Injury Compensation Program (  VICP) is a federal program that was created to compensate people who may have been injured by certain vaccines. Persons who believe they may have been injured by a vaccine can learn about the program and about filing a claim by calling 1-800-338-2382 or visiting the VICP website at www.hrsa.gov/vaccinecompensation. There is a time limit to file a claim for compensation. 7. How can I learn more?  Ask your doctor. He or she can give you the vaccine package insert or suggest other sources of information.  Call your local or state health department.  Contact the Centers for Disease Control and Prevention (CDC):  Call 1-800-232-4636 (1-800-CDC-INFO) or  Visit CDC's website at www.cdc.gov/vaccines CDC Tdap Vaccine VIS (08/29/13)   This information is not intended to replace advice given to you by your health care  provider. Make sure you discuss any questions you have with your health care provider.   Document Released: 12/22/2011 Document Revised: 07/13/2014 Document Reviewed: 10/04/2013 Elsevier Interactive Patient Education 2016 Elsevier Inc.  

## 2015-09-26 NOTE — Progress Notes (Signed)
Date:  09/26/2015   Name:  Joanna Sanders   DOB:  [birth year]   MRN:  [id number]   Chief Complaint: Follow-up She is here for general follow up after a fall on 09/02/15.  She has been by several specialists for frontal sinus fracture and has been slowly healing.  For now she is not going to have any surgery. Her pain is minimal. The plan is to followup in 6 months for a CT to acertain that the frontal sinus remains patent. The fall occurred when she bent over to pick up a box that was much heavier than she thought so she toppled forward and struck her face on a cement step. Laceration of right forehead - had been having some slight yellow drainage.  Sutures have been removed.  No bleeding and no pain.   Review of Systems  Constitutional: Negative for fever, chills and fatigue.  HENT: Negative for nosebleeds, rhinorrhea and sinus pressure.   Eyes: Positive for visual disturbance (mild double vision to lateral gaze).  Respiratory: Negative for chest tightness and shortness of breath.   Cardiovascular: Negative for chest pain.  Musculoskeletal: Negative for arthralgias and gait problem.  Neurological: Negative for tremors, weakness and numbness.    Patient Active Problem List   Diagnosis Date Noted  . Closed fracture of frontal sinus ([place]) 09/26/2015  . Nasal bone fracture 09/26/2015  . Allergic rhinitis 01/24/2015  . Benign essential HTN 01/24/2015  . HLD (hyperlipidemia) 01/24/2015  . Acid reflux 01/24/2015  . Generalized OA 01/24/2015  . Hyperparathyroidism, primary ([place]) 01/24/2015  . D (diarrhea) 01/24/2015  . OP (osteoporosis) 01/24/2015  . Avitaminosis D 01/24/2015    Prior to Admission medications   Medication Sig Start Date End Date Taking? Authorizing Provider  albuterol (PROVENTIL HFA;VENTOLIN HFA) 108 (90 BASE) MCG/ACT inhaler Inhale 2 puffs into the lungs every 6 (six) hours as needed for wheezing or shortness of breath. 01/30/15  Yes Glean Hess, MD    alendronate (FOSAMAX) 70 MG tablet Take 1 tablet by mouth once a week. 11/14/14  Yes Historical Provider, MD  cholecalciferol (VITAMIN D) 1000 UNITS tablet Take 1 tablet by mouth daily.   Yes Historical Provider, MD  Esomeprazole Magnesium 20 MG TBEC Take 1 tablet by mouth daily.   Yes Historical Provider, MD  furosemide (LASIX) 20 MG tablet TAKE 1 TABLET BY MOUTH DAILY. 09/23/15  Yes Glean Hess, MD  chlorzoxazone (PARAFON) 500 MG tablet Take 1 tablet by mouth 3 (three) times daily. Reported on 09/26/2015 08/26/15   Historical Provider, MD  dicyclomine (BENTYL) 10 MG capsule Take 1 capsule by mouth 3 (three) times daily. Reported on 09/26/2015 06/22/14   Historical Provider, MD  meclizine (ANTIVERT) 25 MG tablet Take 1 tablet (25 mg total) by mouth 3 (three) times daily as needed. Patient not taking: Reported on 06/03/2015 03/12/15   Glean Hess, MD  meloxicam (MOBIC) 15 MG tablet Take 1 tablet by mouth daily. Reported on 09/26/2015 08/26/15   Historical Provider, MD  ondansetron (ZOFRAN) 4 MG tablet Take 1 tablet (4 mg total) by mouth every 8 (eight) hours as needed for nausea or vomiting. Patient not taking: Reported on 06/03/2015 03/12/15   Glean Hess, MD    Allergies  Allergen Reactions  . Hydrochlorothiazide     hypercalcemia  . Prednisone Other (See Comments)    Gets really hyper    Past Surgical History  Procedure Laterality Date  . Cholecystectomy  2013  . Carpal  tunnel release Right   . Total shoulder replacement Left 2013  . Cataract extraction      Social History  Substance Use Topics  . Smoking status: Former Research scientist (life sciences)  . Smokeless tobacco: None  . Alcohol Use: No     Medication list has been reviewed and updated.   Physical Exam  Constitutional: She is oriented to person, place, and time. She appears well-developed. No distress.  HENT:  Head: Normocephalic.    Cardiovascular: Normal rate, regular rhythm and normal heart sounds.   Pulmonary/Chest:  Effort normal and breath sounds normal. No respiratory distress.  Musculoskeletal: Normal range of motion.  Neurological: She is alert and oriented to person, place, and time.  Skin: Skin is warm and dry. No rash noted.     Psychiatric: She has a normal mood and affect. Her behavior is normal. Thought content normal.    BP 130/80 mmHg  Pulse 72  Ht 5\' 6"  (1.676 m)  Wt 176 lb (79.833 kg)  BMI 28.42 kg/m2  Assessment and Plan: 1. Closed fracture of frontal sinus, initial encounter ([place]) Laceration healed Frontal sinus defect persists - no surgical correction for now Follow up with specialist in 3 months  2. Need for diphtheria-tetanus-pertussis (Tdap) vaccine - Tdap vaccine greater than or equal to 7yo IM   Halina Maidens, MD [facility]  09/26/2015

## 2015-12-16 ENCOUNTER — Telehealth: Payer: Self-pay

## 2015-12-16 NOTE — Telephone Encounter (Signed)
She either needs to come see me to discuss possible options OR follow up with GI - Dr. Epimenio Foot in [place].

## 2015-12-16 NOTE — Telephone Encounter (Signed)
Patient reports still having loose stools x 1 year. Last OV 3 months ago. Would like to know what to do about this or if she needs to take something or do something to help stop this.

## 2015-12-17 NOTE — Telephone Encounter (Signed)
LMTCB

## 2015-12-19 NOTE — Telephone Encounter (Signed)
Leaving to [facility] and right now is ok but does have episodes of severe diarrhea. She is concerned with going out of town like this. Will get colonoscopy in 3 weeks.

## 2016-01-01 DIAGNOSIS — S0219XD Other fracture of base of skull, subsequent encounter for fracture with routine healing: Secondary | ICD-10-CM | POA: Diagnosis not present

## 2016-01-01 DIAGNOSIS — Y92099 Unspecified place in other non-institutional residence as the place of occurrence of the external cause: Secondary | ICD-10-CM | POA: Diagnosis not present

## 2016-01-01 DIAGNOSIS — W19XXXS Unspecified fall, sequela: Secondary | ICD-10-CM | POA: Diagnosis not present

## 2016-01-14 DIAGNOSIS — K5730 Diverticulosis of large intestine without perforation or abscess without bleeding: Secondary | ICD-10-CM | POA: Diagnosis not present

## 2016-01-14 DIAGNOSIS — Z86010 Personal history of colonic polyps: Secondary | ICD-10-CM | POA: Diagnosis not present

## 2016-01-14 DIAGNOSIS — D120 Benign neoplasm of cecum: Secondary | ICD-10-CM | POA: Diagnosis not present

## 2016-01-14 DIAGNOSIS — Z1211 Encounter for screening for malignant neoplasm of colon: Secondary | ICD-10-CM | POA: Diagnosis not present

## 2016-01-14 DIAGNOSIS — D122 Benign neoplasm of ascending colon: Secondary | ICD-10-CM | POA: Diagnosis not present

## 2016-01-29 DIAGNOSIS — L570 Actinic keratosis: Secondary | ICD-10-CM | POA: Diagnosis not present

## 2016-01-29 DIAGNOSIS — D485 Neoplasm of uncertain behavior of skin: Secondary | ICD-10-CM | POA: Diagnosis not present

## 2016-01-29 DIAGNOSIS — L988 Other specified disorders of the skin and subcutaneous tissue: Secondary | ICD-10-CM | POA: Diagnosis not present

## 2016-02-12 ENCOUNTER — Encounter: Payer: Self-pay | Admitting: Internal Medicine

## 2016-02-12 ENCOUNTER — Ambulatory Visit (INDEPENDENT_AMBULATORY_CARE_PROVIDER_SITE_OTHER): Payer: Medicare HMO | Admitting: Internal Medicine

## 2016-02-12 VITALS — BP 120/78 | HR 84 | Resp 16 | Ht 66.0 in | Wt 175.6 lb

## 2016-02-12 DIAGNOSIS — E210 Primary hyperparathyroidism: Secondary | ICD-10-CM | POA: Diagnosis not present

## 2016-02-12 DIAGNOSIS — D1801 Hemangioma of skin and subcutaneous tissue: Secondary | ICD-10-CM | POA: Diagnosis not present

## 2016-02-12 DIAGNOSIS — I10 Essential (primary) hypertension: Secondary | ICD-10-CM

## 2016-02-12 DIAGNOSIS — L988 Other specified disorders of the skin and subcutaneous tissue: Secondary | ICD-10-CM | POA: Diagnosis not present

## 2016-02-12 MED ORDER — FUROSEMIDE 20 MG PO TABS: 20.0000 mg | ORAL_TABLET | Freq: Two times a day (BID) | ORAL | 12 refills | Status: DC

## 2016-02-12 NOTE — Progress Notes (Signed)
Date:  02/12/2016   Name:  Joanna Sanders   DOB:  Jan 14, 1930   MRN:  [id number]   Chief Complaint: Hypertension (Patient has had elevated BP 160/60-190/80); Fatigue (Very tired around 3PM); and Headache  Hypertension  This is a chronic problem. The current episode started more than 1 year ago. The problem has been gradually worsening (very high readings at home on cuff that is 80 years old.) since onset. Associated symptoms include headaches. Pertinent negatives include no chest pain, palpitations or shortness of breath. Past treatments include diuretics. The current treatment provides mild improvement.  Headache   This is a new (one bad headache last week) problem. Pertinent negatives include no abdominal pain, coughing, dizziness or numbness. Her past medical history is significant for hypertension.   Fatigue - just does not have the same energy she usually has.  No specific symptoms.  No dizziness or syncope.  She had one episode of chest heaviness several weeks ago that lasted only 5 minutes and has not recurred.  She has not had her calcium or PTH checked recently.  Review of Systems  Constitutional: Positive for fatigue.  Eyes: Negative for visual disturbance.  Respiratory: Negative for cough, chest tightness, shortness of breath and wheezing.   Cardiovascular: Negative for chest pain, palpitations and leg swelling.  Gastrointestinal: Negative for abdominal pain, constipation and diarrhea.  Genitourinary: Negative for dysuria.  Neurological: Positive for headaches. Negative for dizziness, tremors and numbness.  Hematological: Negative for adenopathy.  Psychiatric/Behavioral: Negative for dysphoric mood and sleep disturbance. The patient is not nervous/anxious.      Patient Active Problem List   Diagnosis Date Noted  . Closed fracture of frontal sinus ([place]) 09/26/2015  . Nasal bone fracture 09/26/2015  . Allergic rhinitis 01/24/2015  . Benign essential HTN 01/24/2015  .  HLD (hyperlipidemia) 01/24/2015  . Acid reflux 01/24/2015  . Generalized OA 01/24/2015  . Hyperparathyroidism, primary ([place]) 01/24/2015  . D (diarrhea) 01/24/2015  . OP (osteoporosis) 01/24/2015  . Avitaminosis D 01/24/2015    Prior to Admission medications   Medication Sig Start Date End Date Taking? Authorizing Provider  alendronate (FOSAMAX) 70 MG tablet Take 1 tablet by mouth once a week. 11/14/14  Yes Historical Provider, MD  cholecalciferol (VITAMIN D) 1000 UNITS tablet Take 1 tablet by mouth daily.   Yes Historical Provider, MD  esomeprazole (NEXIUM) 40 MG capsule Take 40 mg by mouth daily at 12 noon.   Yes Historical Provider, MD  furosemide (LASIX) 20 MG tablet TAKE 1 TABLET BY MOUTH DAILY. 09/23/15  Yes Glean Hess, MD    Allergies  Allergen Reactions  . Hydrochlorothiazide     hypercalcemia  . Prednisone Other (See Comments)    Gets really hyper    Past Surgical History:  Procedure Laterality Date  . CARPAL TUNNEL RELEASE Right   . CATARACT EXTRACTION    . CHOLECYSTECTOMY  2013  . TOTAL SHOULDER REPLACEMENT Left 2013    Social History  Substance Use Topics  . Smoking status: Former Research scientist (life sciences)  . Smokeless tobacco: Not on file  . Alcohol use No     Medication list has been reviewed and updated.   Physical Exam  Constitutional: She is oriented to person, place, and time. She appears well-developed. No distress.  HENT:  Head: Normocephalic and atraumatic.  Neck: Normal range of motion. Neck supple.  Cardiovascular: Normal rate, regular rhythm and normal heart sounds.   Pulmonary/Chest: Effort normal and breath sounds normal. No respiratory distress.  She has no wheezes. She has no rales.  Musculoskeletal: Normal range of motion.  Neurological: She is alert and oriented to person, place, and time.  Skin: Skin is warm and dry. No rash noted.  Psychiatric: She has a normal mood and affect. Her behavior is normal. Thought content normal.  Nursing note and  vitals reviewed.   BP 120/78 (BP Location: Right Arm, Patient Position: Sitting, Cuff Size: Normal)   Pulse 84   Resp 16   Ht 5\' 6"  (1.676 m)   Wt 175 lb 9.6 oz (79.7 kg)   SpO2 97%   BMI 28.34 kg/m   Assessment and Plan: 1. Benign essential HTN Controlled but with increased edema - increase lasix to bid PRN - Comprehensive metabolic panel - CBC with Differential/Platelet - furosemide (LASIX) 20 MG tablet; Take 1 tablet (20 mg total) by mouth 2 (two) times daily.  Dispense: 60 tablet; Refill: 12  2. Hyperparathyroidism, primary ([place]) Recheck labs - may be causing fatigue - PTH, Intact and Calcium   Halina Maidens, MD [facility]  02/12/2016

## 2016-02-13 LAB — CBC WITH DIFFERENTIAL/PLATELET
Basophils Absolute: 0 10*3/uL (ref 0.0–0.2)
Basos: 1 %
EOS (ABSOLUTE): 0.1 10*3/uL (ref 0.0–0.4)
Eos: 1 %
Hematocrit: 36.3 % (ref 34.0–46.6)
Hemoglobin: 12.1 g/dL (ref 11.1–15.9)
IMMATURE GRANULOCYTES: 1 %
Immature Grans (Abs): 0 10*3/uL (ref 0.0–0.1)
Lymphocytes Absolute: 2.4 10*3/uL (ref 0.7–3.1)
Lymphs: 38 %
MCH: 30 pg (ref 26.6–33.0)
MCHC: 33.3 g/dL (ref 31.5–35.7)
MCV: 90 fL (ref 79–97)
MONOS ABS: 0.7 10*3/uL (ref 0.1–0.9)
Monocytes: 11 %
NEUTROS PCT: 48 %
Neutrophils Absolute: 3.1 10*3/uL (ref 1.4–7.0)
PLATELETS: 271 10*3/uL (ref 150–379)
RBC: 4.04 x10E6/uL (ref 3.77–5.28)
RDW: 13.4 % (ref 12.3–15.4)
WBC: 6.4 10*3/uL (ref 3.4–10.8)

## 2016-02-13 LAB — COMPREHENSIVE METABOLIC PANEL
A/G RATIO: 1.8 (ref 1.2–2.2)
ALK PHOS: 81 IU/L (ref 39–117)
ALT: 15 IU/L (ref 0–32)
AST: 20 IU/L (ref 0–40)
Albumin: 4.4 g/dL (ref 3.5–4.7)
BILIRUBIN TOTAL: 0.3 mg/dL (ref 0.0–1.2)
BUN/Creatinine Ratio: 17 (ref 12–28)
BUN: 13 mg/dL (ref 8–27)
CO2: 25 mmol/L (ref 18–29)
Calcium: 10.9 mg/dL — ABNORMAL HIGH (ref 8.7–10.3)
Chloride: 98 mmol/L (ref 96–106)
Creatinine, Ser: 0.75 mg/dL (ref 0.57–1.00)
GFR calc Af Amer: 85 mL/min/{1.73_m2} (ref >59)
GFR calc non Af Amer: 74 mL/min/{1.73_m2} (ref >59)
GLOBULIN, TOTAL: 2.5 g/dL (ref 1.5–4.5)
Glucose: 107 mg/dL — ABNORMAL HIGH (ref 65–99)
POTASSIUM: 3.8 mmol/L (ref 3.5–5.2)
SODIUM: 138 mmol/L (ref 134–144)
Total Protein: 6.9 g/dL (ref 6.0–8.5)

## 2016-02-13 LAB — PTH, INTACT AND CALCIUM: PTH: 142 pg/mL — ABNORMAL HIGH (ref 15–65)

## 2016-02-24 DIAGNOSIS — K219 Gastro-esophageal reflux disease without esophagitis: Secondary | ICD-10-CM | POA: Diagnosis not present

## 2016-02-24 DIAGNOSIS — Z0000 Encounter for general adult medical examination without abnormal findings: Secondary | ICD-10-CM | POA: Diagnosis not present

## 2016-02-25 DIAGNOSIS — R69 Illness, unspecified: Secondary | ICD-10-CM | POA: Diagnosis not present

## 2016-03-09 DIAGNOSIS — B9689 Other specified bacterial agents as the cause of diseases classified elsewhere: Secondary | ICD-10-CM | POA: Diagnosis not present

## 2016-03-09 DIAGNOSIS — J208 Acute bronchitis due to other specified organisms: Secondary | ICD-10-CM | POA: Diagnosis not present

## 2016-04-06 ENCOUNTER — Ambulatory Visit (INDEPENDENT_AMBULATORY_CARE_PROVIDER_SITE_OTHER): Payer: Medicare HMO | Admitting: Internal Medicine

## 2016-04-06 ENCOUNTER — Encounter: Payer: Self-pay | Admitting: Internal Medicine

## 2016-04-06 VITALS — BP 122/74 | HR 74 | Resp 16 | Ht 66.0 in | Wt 175.6 lb

## 2016-04-06 DIAGNOSIS — M810 Age-related osteoporosis without current pathological fracture: Secondary | ICD-10-CM | POA: Diagnosis not present

## 2016-04-06 DIAGNOSIS — I10 Essential (primary) hypertension: Secondary | ICD-10-CM | POA: Diagnosis not present

## 2016-04-06 DIAGNOSIS — R5383 Other fatigue: Secondary | ICD-10-CM | POA: Diagnosis not present

## 2016-04-06 DIAGNOSIS — Z1231 Encounter for screening mammogram for malignant neoplasm of breast: Secondary | ICD-10-CM | POA: Diagnosis not present

## 2016-04-06 DIAGNOSIS — Z0000 Encounter for general adult medical examination without abnormal findings: Secondary | ICD-10-CM

## 2016-04-06 DIAGNOSIS — E782 Mixed hyperlipidemia: Secondary | ICD-10-CM | POA: Diagnosis not present

## 2016-04-06 DIAGNOSIS — E210 Primary hyperparathyroidism: Secondary | ICD-10-CM

## 2016-04-06 DIAGNOSIS — Z23 Encounter for immunization: Secondary | ICD-10-CM | POA: Diagnosis not present

## 2016-04-06 DIAGNOSIS — K219 Gastro-esophageal reflux disease without esophagitis: Secondary | ICD-10-CM | POA: Diagnosis not present

## 2016-04-06 DIAGNOSIS — R1011 Right upper quadrant pain: Secondary | ICD-10-CM | POA: Diagnosis not present

## 2016-04-06 MED ORDER — MECLIZINE HCL 12.5 MG PO TABS: 12.5000 mg | ORAL_TABLET | Freq: Three times a day (TID) | ORAL | 0 refills | Status: DC | PRN

## 2016-04-06 MED ORDER — CHOLESTYRAMINE 4 G PO PACK: 4.0000 g | PACK | Freq: Two times a day (BID) | ORAL | 12 refills | Status: DC

## 2016-04-06 MED ORDER — ONDANSETRON HCL 4 MG PO TABS: 4.0000 mg | ORAL_TABLET | Freq: Three times a day (TID) | ORAL | 0 refills | Status: DC | PRN

## 2016-04-06 NOTE — Patient Instructions (Signed)
Health Maintenance  Topic Date Due  . MAMMOGRAM  05/01/2016  . COLONOSCOPY  01/14/2019  . TETANUS/TDAP  09/25/2025  . INFLUENZA VACCINE  Addressed  . DEXA SCAN  Addressed  . ZOSTAVAX  Completed  . PNA vac Low Risk Adult  Completed

## 2016-04-06 NOTE — Progress Notes (Signed)
Patient: Joanna Sanders, Female    DOB: 04-22-1930, 82 y.o.   MRN: [id number] Visit Date: 04/06/2016  Today's Provider: Halina Maidens, MD   Chief Complaint  Patient presents with  . Medicare Wellness   Subjective:    Annual wellness visit Joanna Sanders [place] is a 82 y.o. female who presents today for her Subsequent Annual Wellness Visit. She feels fatigued beyond what she thinks she should expect at her age. She reports exercising walking. She reports she is sleeping fairly well.   ----------------------------------------------------------- Hypertension  This is a chronic problem. The current episode started more than 1 year ago. Associated symptoms include peripheral edema and shortness of breath (intermittently). Pertinent negatives include no chest pain (intermittent), headaches or palpitations.  Hyperlipidemia  Associated symptoms include shortness of breath (intermittently). Pertinent negatives include no chest pain (intermittent).  Gastroesophageal Reflux  She complains of abdominal pain (right side discomfort) and heartburn. She reports no chest pain (intermittent), no coughing or no wheezing. This is a recurrent problem. The current episode started more than 1 year ago. The problem occurs rarely. The heartburn does not wake her from sleep. The heartburn does not limit her activity. The symptoms are aggravated by certain foods. Associated symptoms include fatigue. She has tried a PPI for the symptoms.  Hyperparathyroidism - labs 2 months ago were stable.  Seen by Endocrinology and released for 2 years.  Pt concerned that her fatigue might be related.  She also noted an EKG that mentioned LVH.  Review of [facility] chart also shows a stress test in 2006 that seems to indication mild concentric LVH. Right Abdominal Pain - has noticed pain for the past several months in the right upper abdomen and right side.  Worse with sitting in certain positions.  Bothers her a night to turn over in  bed.  Feels like pressure up under her right anterior ribs.  No change in bowel habits - still has occasional bile acid diarrhea.  Colonoscopy was done recently and was normal.  Has hx of pancreatic cysts - [place] one year ago was normal except for mild pancreatic ductal dilatation.  She is s/p cholecystectomy.  Review of Systems  Constitutional: Positive for fatigue. Negative for chills and fever.  HENT: Negative for congestion, hearing loss, tinnitus, trouble swallowing and voice change.   Eyes: Negative for visual disturbance.  Respiratory: Positive for shortness of breath (intermittently). Negative for cough, chest tightness and wheezing.   Cardiovascular: Positive for leg swelling. Negative for chest pain (intermittent) and palpitations.  Gastrointestinal: Positive for abdominal pain (right side discomfort) and heartburn. Negative for constipation, diarrhea and vomiting.  Endocrine: Negative for polydipsia and polyuria.  Genitourinary: Negative for dysuria, frequency, genital sores, vaginal bleeding and vaginal discharge.  Musculoskeletal: Negative for arthralgias, gait problem and joint swelling.  Skin: Negative for color change and rash.  Neurological: Negative for dizziness, tremors, light-headedness and headaches.  Hematological: Negative for adenopathy. Does not bruise/bleed easily.  Psychiatric/Behavioral: Negative for dysphoric mood and sleep disturbance. The patient is not nervous/anxious.     Social History   Social History  . Marital status: Widowed    Spouse name: N/A  . Number of children: N/A  . Years of education: N/A   Occupational History  . Not on file.   Social History Main Topics  . Smoking status: Former Research scientist (life sciences)  . Smokeless tobacco: Never Used  . Alcohol use No  . Drug use: No  . Sexual activity: Not on file   Other Topics  Concern  . Not on file   Social History Narrative  . No narrative on file    Patient Active Problem List   Diagnosis Date Noted  .  Age-related osteoporosis without current pathological fracture 04/06/2016  . Closed fracture of frontal sinus ([place]) 09/26/2015  . Nasal bone fracture 09/26/2015  . Allergic rhinitis 01/24/2015  . Benign essential HTN 01/24/2015  . HLD (hyperlipidemia) 01/24/2015  . Acid reflux 01/24/2015  . Generalized OA 01/24/2015  . Hyperparathyroidism, primary ([place]) 01/24/2015  . D (diarrhea) 01/24/2015  . OP (osteoporosis) 01/24/2015  . Avitaminosis D 01/24/2015    Past Surgical History:  Procedure Laterality Date  . CARPAL TUNNEL RELEASE Right   . CATARACT EXTRACTION    . CHOLECYSTECTOMY  2013  . TOTAL SHOULDER REPLACEMENT Left 2013    Her family history includes Breast cancer in her daughter; Heart failure in her father; Hypertension in her mother.    Previous Medications   ALENDRONATE (FOSAMAX) 70 MG TABLET    Take 1 tablet by mouth once a week.   CHOLECALCIFEROL (VITAMIN D) 1000 UNITS TABLET    Take 1 tablet by mouth daily.   CHOLESTYRAMINE PO    Take 40 mg by mouth.   ESOMEPRAZOLE (NEXIUM) 40 MG CAPSULE    Take 40 mg by mouth daily at 12 noon.   FUROSEMIDE (LASIX) 20 MG TABLET    Take 1 tablet (20 mg total) by mouth 2 (two) times daily.   LOPERAMIDE HCL (IMODIUM A-D PO)    Take by mouth.    Patient Care Team: Glean Hess, MD as PCP - General (Internal Medicine) Farley Ly, MD as Referring Physician (Endocrinology)     Objective:   Vitals: BP 122/74   Pulse 74   Resp 16   Ht 5\' 6"  (1.676 m)   Wt 175 lb 9.6 oz (79.7 kg)   BMI 28.34 kg/m   Physical Exam  Constitutional: She is oriented to person, place, and time. She appears well-developed and well-nourished. No distress.  HENT:  Head: Normocephalic and atraumatic.  Right Ear: Tympanic membrane and ear canal normal.  Left Ear: Tympanic membrane and ear canal normal.  Nose: Right sinus exhibits no maxillary sinus tenderness. Left sinus exhibits no maxillary sinus tenderness.  Mouth/Throat: Uvula is midline and  oropharynx is clear and moist.  Eyes: Conjunctivae and EOM are normal. Right eye exhibits no discharge. Left eye exhibits no discharge. No scleral icterus.  Neck: Normal range of motion. Carotid bruit is not present. No erythema present. No thyromegaly present.  Cardiovascular: Normal rate, regular rhythm, normal heart sounds and normal pulses.   Pulmonary/Chest: Effort normal. No respiratory distress. She has no wheezes. Right breast exhibits no mass, no nipple discharge, no skin change and no tenderness. Left breast exhibits no mass, no nipple discharge, no skin change and no tenderness.  Abdominal: Soft. Normal appearance and bowel sounds are normal. She exhibits no mass. There is tenderness in the right upper quadrant. There is no rigidity, no rebound, no guarding and no CVA tenderness.  Musculoskeletal: Normal range of motion.  Lymphadenopathy:    She has no cervical adenopathy.    She has no axillary adenopathy.  Neurological: She is alert and oriented to person, place, and time. She has normal reflexes. No cranial nerve deficit or sensory deficit.  Skin: Skin is warm, dry and intact. No rash noted.  Psychiatric: She has a normal mood and affect. Her speech is normal and behavior is normal. Thought content  normal.  Nursing note and vitals reviewed.   Activities of Daily Living In your present state of health, do you have any difficulty performing the following activities: 04/06/2016 02/12/2016  Hearing? - N  Vision? N N  Difficulty concentrating or making decisions? N N  Walking or climbing stairs? N N  Dressing or bathing? N N  Doing errands, shopping? N N  Preparing Food and eating ? N -  Using the Toilet? N -  In the past six months, have you accidently leaked urine? Y -  Do you have problems with loss of bowel control? N -  Managing your Medications? N -  Managing your Finances? N -  Housekeeping or managing your Housekeeping? N -  Some recent data might be hidden    Fall  Risk Assessment Fall Risk  04/06/2016 02/12/2016 01/25/2015  Falls in the past year? Yes Yes No  Number falls in past yr: 1 1 -  Injury with Fall? Yes No -  Risk for fall due to : - Impaired balance/gait -  Follow up Follow up appointment Falls prevention discussed -      Depression Screen PHQ 2/9 Scores 04/06/2016 02/12/2016 01/25/2015  PHQ - 2 Score 0 0 0    Cognitive Testing - 6-CIT   Correct? Score   What year is it? yes 0 Yes = 0    No = 4  What month is it? yes 0 Yes = 0    No = 3  Remember:     [address], [place], [place]     What time is it? yes 0 Yes = 0    No = 3  Count backwards from 20 to 1 yes 0 Correct = 0    1 error = 2   More than 1 error = 4  Say the months of the year in reverse. yes 0 Correct = 0    1 error = 2   More than 1 error = 4  What address did I ask you to remember? yes 0 Correct = 0  1 error = 2    2 error = 4    3 error = 6    4 error = 8    All wrong = 10       TOTAL SCORE  0/28   Interpretation:  Normal  Normal (0-7) Abnormal (8-28)        Medicare Annual Wellness Visit Summary:  Reviewed patient's Family Medical History Reviewed and updated list of patient's medical providers Assessment of cognitive impairment was done Assessed patient's functional ability Established a written schedule for health screening [place] Completed and Reviewed  Exercise Activities and Dietary recommendations Goals    None      Immunization History  Administered Date(s) Administered  . Influenza-Unspecified 02/18/2015  . Pneumococcal Conjugate-13 03/20/2014  . Pneumococcal Polysaccharide-23 07/07/2005  . Tdap 09/26/2015  . Zoster 07/08/2007    Health Maintenance  Topic Date Due  . MAMMOGRAM  05/01/2016  . COLONOSCOPY  01/14/2019  . TETANUS/TDAP  09/25/2025  . INFLUENZA VACCINE  Addressed  . DEXA SCAN  Addressed  . ZOSTAVAX  Completed  . PNA vac Low Risk Adult  Completed     Discussed health benefits of physical  activity, and encouraged her to engage in regular exercise appropriate for her age and condition.    ------------------------------------------------------------------------------------------------------------   Assessment & Plan:  1. Medicare annual wellness visit, subsequent Measures satisfied - POCT urinalysis  dipstick  2. Benign essential HTN Controlled on diuretic Concern about ongoing fatigue, abnormal EKG - TSH - Ambulatory referral to Cardiology  3. Gastroesophageal reflux disease, esophagitis presence not specified Stable on nexium - CBC with Differential/Platelet  4. Hyperparathyroidism, primary ([place]) stable - Comprehensive metabolic panel - Ambulatory referral to Cardiology  5. Mixed hyperlipidemia - Lipid panel  6. Age-related osteoporosis without current pathological fracture Continue Fosamax and vitamin D  7. Encounter for screening mammogram for breast cancer - MM DIGITAL SCREENING BILATERAL; Future  8. Right upper quadrant abdominal pain Repeat US and refer if needed - ondansetron (ZOFRAN) 4 MG tablet; Take 1 tablet (4 mg total) by mouth every 8 (eight) hours as needed for nausea or vomiting.  Dispense: 20 tablet; Refill: 0 - US Abdomen Complete; Future  9. Need for influenza vaccination - Flu Vaccine QUAD 36+ mos IM  10. Fatigue, unspecified type Has not had cardiology evaluation in more than 10 yrs - concern for possible LVH in setting of fatigue, hyperparathyroidism and edema - Ambulatory referral to Cardiology   Halina Maidens, MD [facility]  04/06/2016

## 2016-04-07 ENCOUNTER — Ambulatory Visit: Payer: Medicare HMO

## 2016-04-07 LAB — CBC WITH DIFFERENTIAL/PLATELET
BASOS ABS: 0 10*3/uL (ref 0.0–0.2)
Basos: 1 %
EOS (ABSOLUTE): 0.1 10*3/uL (ref 0.0–0.4)
Eos: 1 %
HEMOGLOBIN: 12.3 g/dL (ref 11.1–15.9)
Hematocrit: 35.4 % (ref 34.0–46.6)
IMMATURE GRANULOCYTES: 0 %
Immature Grans (Abs): 0 10*3/uL (ref 0.0–0.1)
LYMPHS ABS: 2.4 10*3/uL (ref 0.7–3.1)
LYMPHS: 40 %
MCH: 32 pg (ref 26.6–33.0)
MCHC: 34.7 g/dL (ref 31.5–35.7)
MCV: 92 fL (ref 79–97)
MONOCYTES: 10 %
Monocytes Absolute: 0.6 10*3/uL (ref 0.1–0.9)
NEUTROS PCT: 48 %
Neutrophils Absolute: 2.9 10*3/uL (ref 1.4–7.0)
Platelets: 288 10*3/uL (ref 150–379)
RBC: 3.84 x10E6/uL (ref 3.77–5.28)
RDW: 14 % (ref 12.3–15.4)
WBC: 6 10*3/uL (ref 3.4–10.8)

## 2016-04-07 LAB — COMPREHENSIVE METABOLIC PANEL
ALK PHOS: 84 IU/L (ref 39–117)
ALT: 20 IU/L (ref 0–32)
AST: 20 IU/L (ref 0–40)
Albumin/Globulin Ratio: 1.8 (ref 1.2–2.2)
Albumin: 4.6 g/dL (ref 3.5–4.7)
BUN/Creatinine Ratio: 14 (ref 12–28)
BUN: 11 mg/dL (ref 8–27)
Bilirubin Total: 0.5 mg/dL (ref 0.0–1.2)
CALCIUM: 11 mg/dL — AB (ref 8.7–10.3)
CO2: 26 mmol/L (ref 18–29)
CREATININE: 0.76 mg/dL (ref 0.57–1.00)
Chloride: 101 mmol/L (ref 96–106)
GFR calc Af Amer: 83 mL/min/{1.73_m2} (ref >59)
GFR calc non Af Amer: 72 mL/min/{1.73_m2} (ref >59)
GLUCOSE: 100 mg/dL — AB (ref 65–99)
Globulin, Total: 2.6 g/dL (ref 1.5–4.5)
Potassium: 4.2 mmol/L (ref 3.5–5.2)
SODIUM: 140 mmol/L (ref 134–144)
Total Protein: 7.2 g/dL (ref 6.0–8.5)

## 2016-04-07 LAB — LIPID PANEL
CHOLESTEROL TOTAL: 211 mg/dL — AB (ref 100–199)
Chol/HDL Ratio: 3.8 ratio units (ref 0.0–4.4)
HDL: 55 mg/dL (ref >39)
LDL CALC: 124 mg/dL — AB (ref 0–99)
TRIGLYCERIDES: 161 mg/dL — AB (ref 0–149)
VLDL CHOLESTEROL CAL: 32 mg/dL (ref 5–40)

## 2016-04-07 LAB — TSH: TSH: 1.64 u[IU]/mL (ref 0.450–4.500)

## 2016-04-13 DIAGNOSIS — I10 Essential (primary) hypertension: Secondary | ICD-10-CM | POA: Diagnosis not present

## 2016-04-14 ENCOUNTER — Ambulatory Visit
Admission: RE | Admit: 2016-04-14 | Discharge: 2016-04-14 | Disposition: A | Discharge status: Home / Self Care | Payer: Medicare HMO | Source: Ambulatory Visit | Attending: Internal Medicine | Admitting: Internal Medicine

## 2016-04-14 DIAGNOSIS — K838 Other specified diseases of biliary tract: Secondary | ICD-10-CM | POA: Diagnosis not present

## 2016-04-14 DIAGNOSIS — I700 Atherosclerosis of aorta: Secondary | ICD-10-CM | POA: Insufficient documentation

## 2016-04-14 DIAGNOSIS — K862 Cyst of pancreas: Secondary | ICD-10-CM | POA: Diagnosis not present

## 2016-04-14 DIAGNOSIS — R1011 Right upper quadrant pain: Secondary | ICD-10-CM | POA: Diagnosis not present

## 2016-04-14 DIAGNOSIS — K760 Fatty (change of) liver, not elsewhere classified: Secondary | ICD-10-CM | POA: Diagnosis not present

## 2016-04-15 ENCOUNTER — Other Ambulatory Visit: Payer: Self-pay | Admitting: Internal Medicine

## 2016-04-15 DIAGNOSIS — K862 Cyst of pancreas: Secondary | ICD-10-CM | POA: Insufficient documentation

## 2016-04-15 DIAGNOSIS — K668 Other specified disorders of peritoneum: Secondary | ICD-10-CM | POA: Insufficient documentation

## 2016-04-22 ENCOUNTER — Other Ambulatory Visit: Payer: Self-pay

## 2016-04-23 ENCOUNTER — Ambulatory Visit: Payer: Medicare HMO

## 2016-04-28 ENCOUNTER — Ambulatory Visit
Admission: RE | Admit: 2016-04-28 | Discharge: 2016-04-28 | Disposition: A | Discharge status: Home / Self Care | Payer: Medicare HMO | Source: Ambulatory Visit | Attending: Internal Medicine | Admitting: Internal Medicine

## 2016-04-28 DIAGNOSIS — R1011 Right upper quadrant pain: Secondary | ICD-10-CM | POA: Diagnosis not present

## 2016-04-28 DIAGNOSIS — K5730 Diverticulosis of large intestine without perforation or abscess without bleeding: Secondary | ICD-10-CM | POA: Diagnosis not present

## 2016-04-28 DIAGNOSIS — K862 Cyst of pancreas: Secondary | ICD-10-CM

## 2016-04-28 DIAGNOSIS — K668 Other specified disorders of peritoneum: Secondary | ICD-10-CM

## 2016-04-28 DIAGNOSIS — R935 Abnormal findings on diagnostic imaging of other abdominal regions, including retroperitoneum: Secondary | ICD-10-CM | POA: Insufficient documentation

## 2016-04-28 DIAGNOSIS — I700 Atherosclerosis of aorta: Secondary | ICD-10-CM | POA: Diagnosis not present

## 2016-04-28 DIAGNOSIS — Z9049 Acquired absence of other specified parts of digestive tract: Secondary | ICD-10-CM | POA: Diagnosis not present

## 2016-04-28 MED ORDER — IOPAMIDOL (ISOVUE-370) INJECTION 76%
125.0000 mL | Freq: Once | INTRAVENOUS | Status: AC | PRN
  Administered 2016-04-28: 125 mL via INTRAVENOUS

## 2016-04-29 ENCOUNTER — Other Ambulatory Visit: Payer: Self-pay | Admitting: Internal Medicine

## 2016-04-29 DIAGNOSIS — R1031 Right lower quadrant pain: Secondary | ICD-10-CM

## 2016-04-29 DIAGNOSIS — I10 Essential (primary) hypertension: Secondary | ICD-10-CM | POA: Diagnosis not present

## 2016-04-29 DIAGNOSIS — K862 Cyst of pancreas: Secondary | ICD-10-CM

## 2016-04-29 DIAGNOSIS — R1011 Right upper quadrant pain: Secondary | ICD-10-CM

## 2016-04-29 DIAGNOSIS — G8929 Other chronic pain: Secondary | ICD-10-CM | POA: Insufficient documentation

## 2016-05-13 DIAGNOSIS — R1011 Right upper quadrant pain: Secondary | ICD-10-CM | POA: Diagnosis not present

## 2016-05-18 DIAGNOSIS — H5021 Vertical strabismus, right eye: Secondary | ICD-10-CM | POA: Diagnosis not present

## 2016-05-18 DIAGNOSIS — S0219XD Other fracture of base of skull, subsequent encounter for fracture with routine healing: Secondary | ICD-10-CM | POA: Diagnosis not present

## 2016-05-18 DIAGNOSIS — H532 Diplopia: Secondary | ICD-10-CM | POA: Diagnosis not present

## 2016-05-18 DIAGNOSIS — H5203 Hypermetropia, bilateral: Secondary | ICD-10-CM | POA: Diagnosis not present

## 2016-05-18 DIAGNOSIS — H52223 Regular astigmatism, bilateral: Secondary | ICD-10-CM | POA: Diagnosis not present

## 2016-05-18 DIAGNOSIS — H524 Presbyopia: Secondary | ICD-10-CM | POA: Diagnosis not present

## 2016-05-20 DIAGNOSIS — R1011 Right upper quadrant pain: Secondary | ICD-10-CM | POA: Diagnosis not present

## 2016-05-20 DIAGNOSIS — R938 Abnormal findings on diagnostic imaging of other specified body structures: Secondary | ICD-10-CM | POA: Diagnosis not present

## 2016-06-02 DIAGNOSIS — Z0100 Encounter for examination of eyes and vision without abnormal findings: Secondary | ICD-10-CM | POA: Diagnosis not present

## 2016-06-03 ENCOUNTER — Ambulatory Visit
Admission: RE | Admit: 2016-06-03 | Discharge: 2016-06-03 | Disposition: A | Discharge status: Home / Self Care | Payer: Medicare HMO | Source: Ambulatory Visit | Attending: Internal Medicine | Admitting: Internal Medicine

## 2016-06-03 DIAGNOSIS — Z1231 Encounter for screening mammogram for malignant neoplasm of breast: Secondary | ICD-10-CM | POA: Diagnosis not present

## 2016-06-18 ENCOUNTER — Encounter: Payer: Self-pay | Admitting: Internal Medicine

## 2016-06-18 ENCOUNTER — Other Ambulatory Visit: Payer: Self-pay | Admitting: Internal Medicine

## 2016-06-18 ENCOUNTER — Ambulatory Visit (INDEPENDENT_AMBULATORY_CARE_PROVIDER_SITE_OTHER): Payer: Medicare HMO | Admitting: Internal Medicine

## 2016-06-18 VITALS — BP 122/84 | HR 82 | Temp 97.2°F | Ht 66.0 in | Wt 175.0 lb

## 2016-06-18 DIAGNOSIS — R197 Diarrhea, unspecified: Secondary | ICD-10-CM

## 2016-06-18 DIAGNOSIS — M25512 Pain in left shoulder: Secondary | ICD-10-CM

## 2016-06-18 DIAGNOSIS — G8929 Other chronic pain: Secondary | ICD-10-CM | POA: Diagnosis not present

## 2016-06-18 DIAGNOSIS — R1031 Right lower quadrant pain: Secondary | ICD-10-CM | POA: Diagnosis not present

## 2016-06-18 DIAGNOSIS — M19012 Primary osteoarthritis, left shoulder: Secondary | ICD-10-CM | POA: Insufficient documentation

## 2016-06-18 MED ORDER — CHOLESTYRAMINE 4 GM/DOSE PO POWD: 4.0000 g | Freq: Three times a day (TID) | ORAL | 3 refills | Status: DC

## 2016-06-18 MED ORDER — TRAMADOL HCL 50 MG PO TABS: 50.0000 mg | ORAL_TABLET | Freq: Three times a day (TID) | ORAL | 0 refills | Status: DC | PRN

## 2016-06-18 NOTE — Progress Notes (Signed)
Date:  06/18/2016   Name:  Joanna Sanders   DOB:  [birth year]   MRN:  [id number]   Chief Complaint: Abdominal Pain (f/u) Patient here to follow-up on abdominal pain. She still has persistent right upper quadrant abdominal discomfort as well as right flank discomfort. Her workup has included a CT abdomen and pelvis, an abdominal ultrasound, and a pelvic ultrasound. GYN noted only mild increase in endometrial stripe and doubted that this was contributing to her discomfort. She continues to have bouts of diarrhea relieved by Imodium or cholestyramine. She has not been taking the cholestyramine daily as routine daily doses cause constipation. The diarrhea is impacting her quality of life and limits her activities to a moderate extent. Her weight remains stable, she denies blood in stool, nausea or vomiting. Abdominal Pain  This is a chronic problem. The problem occurs constantly. The problem has been waxing and waning. Associated symptoms include diarrhea. Pertinent negatives include no constipation or dysuria. shoulder surgery  Shoulder Pain   This is a recurrent problem. The current episode started more than 1 year ago. The problem occurs intermittently. The problem has been waxing and waning. The quality of the pain is described as aching and dull. The pain is moderate. shoulder surgery    Review of Systems  Constitutional: Negative for chills, diaphoresis, fatigue and unexpected weight change.  Respiratory: Negative for chest tightness, shortness of breath and wheezing.   Cardiovascular: Negative for chest pain, palpitations and leg swelling.  Gastrointestinal: Positive for abdominal pain and diarrhea. Negative for abdominal distention, blood in stool and constipation.  Genitourinary: Negative for dysuria.  Musculoskeletal: Negative for back pain and joint swelling.  Skin: Negative for rash.  Neurological: Negative for syncope and weakness.    Patient Active Problem List   Diagnosis  Date Noted  . RLQ abdominal pain 04/29/2016  . 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 Hypertension 04/13/2016  . Age-related osteoporosis without current pathological fracture 04/06/2016  . Closed fracture of frontal sinus ([place]) 09/26/2015  . Nasal bone fracture 09/26/2015  . Allergic rhinitis 01/24/2015  . Benign essential HTN 01/24/2015  . HLD (hyperlipidemia) 01/24/2015  . Acid reflux 01/24/2015  . Generalized OA 01/24/2015  . Hyperparathyroidism, primary ([place]) 01/24/2015  . D (diarrhea) 01/24/2015  . OP (osteoporosis) 01/24/2015  . Avitaminosis D 01/24/2015    Prior to Admission medications   Medication Sig Start Date End Date Taking? Authorizing Provider  alendronate (FOSAMAX) 70 MG tablet Take 1 tablet by mouth once a week. 11/14/14  Yes Historical Provider, MD  aspirin EC 81 MG tablet Take by mouth. 04/13/16 04/13/17 Yes Historical Provider, MD  cholecalciferol (VITAMIN D) 1000 UNITS tablet Take 1 tablet by mouth daily.   Yes Historical Provider, MD  cholestyramine (QUESTRAN) 4 g packet Take 1 packet (4 g total) by mouth 2 (two) times daily. 04/06/16  Yes Glean Hess, MD  esomeprazole (NEXIUM) 40 MG capsule Take 40 mg by mouth daily at 12 noon.   Yes Historical Provider, MD  furosemide (LASIX) 20 MG tablet Take 1 tablet (20 mg total) by mouth 2 (two) times daily. 02/12/16  Yes Glean Hess, MD  Loperamide HCl (IMODIUM A-D PO) Take by mouth.   Yes Historical Provider, MD  meclizine (ANTIVERT) 12.5 MG tablet Take 1 tablet (12.5 mg total) by mouth 3 (three) times daily as needed for dizziness. 04/06/16  Yes Glean Hess, MD  ondansetron (ZOFRAN) 4 MG tablet Take 1 tablet (4 mg total) by mouth every  8 (eight) hours as needed for nausea or vomiting. 04/06/16  Yes Glean Hess, MD    Allergies  Allergen Reactions  . Hydrochlorothiazide     hypercalcemia  . Prednisone Other (See Comments)    Gets really hyper    Past Surgical History:  Procedure Laterality Date  . CARPAL  TUNNEL RELEASE Right   . CATARACT EXTRACTION    . CHOLECYSTECTOMY  2013  . TOTAL SHOULDER REPLACEMENT Left 2013    Social History  Substance Use Topics  . Smoking status: Former Research scientist (life sciences)  . Smokeless tobacco: Never Used  . Alcohol use No     Medication list has been reviewed and updated.   Physical Exam  Constitutional: She is oriented to person, place, and time. She appears well-developed. No distress.  HENT:  Head: Normocephalic and atraumatic.  Neck: Normal range of motion. No thyromegaly present.  Cardiovascular: Normal rate, regular rhythm and normal heart sounds.   Pulmonary/Chest: Effort normal and breath sounds normal. No respiratory distress. She has no wheezes.  Abdominal: Soft. Bowel sounds are normal. There is no hepatosplenomegaly. There is tenderness in the right upper quadrant. There is no CVA tenderness.  Tender just above the right iliac crest posteriorly - no mass, no skin lesion, no spinal tenderness, no muscle spasm  Musculoskeletal: She exhibits no edema.       Left shoulder: She exhibits decreased range of motion and tenderness.  Neurological: She is alert and oriented to person, place, and time.  Skin: Skin is warm and dry. No rash noted.  Psychiatric: She has a normal mood and affect. Her behavior is normal. Thought content normal.  Nursing note and vitals reviewed.   BP 122/84   Pulse 82   Temp 97.2 F (36.2 C)   Ht 5\' 6"  (1.676 m)   Wt 175 lb (79.4 kg)   SpO2 97%   BMI 28.25 kg/m   Assessment and Plan: 1. Diarrhea, unspecified type Take cholestyramine daily - adjust dose to control diarrhea without constipation Begin probiotics bid - cholestyramine (QUESTRAN) 4 GM/DOSE powder; Take 1 packet (4 g total) by mouth 3 (three) times daily with meals.  Dispense: 378 g; Refill: 3  2. RLQ abdominal pain Work up complete Suspect adhesions/trapped gas/bile acid diarrha  3. Chronic left shoulder pain Tylenol not effective; NSAIDS cause gastritis -  traMADol (ULTRAM) 50 MG tablet; Take 1 tablet (50 mg total) by mouth every 8 (eight) hours as needed.  Dispense: 30 tablet; Refill: 0   Halina Maidens, MD [facility]  06/18/2016

## 2016-06-18 NOTE — Patient Instructions (Signed)
Begin a Probiotic - get one with multiple strains - take as directed on the bottle.  Begin Questran - adjust daily dose to keep diarrhea at bay

## 2016-06-25 DIAGNOSIS — R69 Illness, unspecified: Secondary | ICD-10-CM | POA: Diagnosis not present

## 2016-07-03 ENCOUNTER — Encounter: Payer: Self-pay | Admitting: Internal Medicine

## 2016-07-03 ENCOUNTER — Ambulatory Visit (INDEPENDENT_AMBULATORY_CARE_PROVIDER_SITE_OTHER): Payer: Medicare HMO | Admitting: Internal Medicine

## 2016-07-03 ENCOUNTER — Other Ambulatory Visit: Payer: Self-pay | Admitting: Internal Medicine

## 2016-07-03 VITALS — BP 138/72 | HR 86 | Temp 97.6°F | Ht 66.0 in | Wt 175.0 lb

## 2016-07-03 DIAGNOSIS — N3001 Acute cystitis with hematuria: Secondary | ICD-10-CM

## 2016-07-03 LAB — POCT URINALYSIS DIPSTICK
GLUCOSE UA: NEGATIVE
KETONES UA: NEGATIVE
Nitrite, UA: POSITIVE
Protein, UA: NEGATIVE
Urobilinogen, UA: 0.2
pH, UA: 6

## 2016-07-03 MED ORDER — SULFAMETHOXAZOLE-TRIMETHOPRIM 800-160 MG PO TABS: 1.0000 | ORAL_TABLET | Freq: Two times a day (BID) | ORAL | 0 refills | Status: DC

## 2016-07-03 NOTE — Progress Notes (Signed)
Date:  07/03/2016   Name:  Joanna Sanders   DOB:  [birth year]   MRN:  [id number]   Chief Complaint: Urinary Tract Infection (Pt stated burning, cloudy urine for 4 days.) Urinary Tract Infection   This is a new problem. The current episode started in the past 7 days. The problem occurs every urination. The problem has been gradually worsening. The quality of the pain is described as burning. Associated symptoms include frequency and urgency. Pertinent negatives include no chills or hematuria.  She has noted cloudy urine and some meatal discomfort.  Minimal flank pain and no fever.  She thinks she has been drinking sufficient fluids. Abdominal discomfort has improved somewhat with the addition of Probiotics.    Review of Systems  Constitutional: Negative for chills, fatigue and fever.  Respiratory: Negative for chest tightness and shortness of breath.   Cardiovascular: Negative for chest pain.  Gastrointestinal: Positive for abdominal pain (improving).  Genitourinary: Positive for dysuria, frequency and urgency. Negative for hematuria.  Hematological: Negative for adenopathy.    Patient Active Problem List   Diagnosis Date Noted  . Chronic left shoulder pain 06/18/2016  . RLQ abdominal pain 04/29/2016  . 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 Age-related osteoporosis without current pathological fracture 04/06/2016  . Closed fracture of frontal sinus ([place]) 09/26/2015  . Nasal bone fracture 09/26/2015  . Allergic rhinitis 01/24/2015  . Benign essential HTN 01/24/2015  . HLD (hyperlipidemia) 01/24/2015  . Acid reflux 01/24/2015  . Generalized OA 01/24/2015  . Hyperparathyroidism, primary ([place]) 01/24/2015  . Diarrhea 01/24/2015  . OP (osteoporosis) 01/24/2015  . Avitaminosis D 01/24/2015    Prior to Admission medications   Medication Sig Start Date End Date Taking? Authorizing Provider  alendronate (FOSAMAX) 70 MG tablet Take 1 tablet by mouth once a week. 11/14/14  Yes  Historical Provider, MD  aspirin EC 81 MG tablet Take by mouth. 04/13/16 04/13/17 Yes Historical Provider, MD  cholecalciferol (VITAMIN D) 1000 UNITS tablet Take 1 tablet by mouth daily.   Yes Historical Provider, MD  cholestyramine (QUESTRAN) 4 g packet TAKE 1 PACKET BY MOUTH THREE TIMES DAILY WITH MEALS 06/18/16  Yes Glean Hess, MD  esomeprazole (NEXIUM) 40 MG capsule Take 40 mg by mouth daily at 12 noon.   Yes Historical Provider, MD  furosemide (LASIX) 20 MG tablet Take 1 tablet (20 mg total) by mouth 2 (two) times daily. 02/12/16  Yes Glean Hess, MD  Loperamide HCl (IMODIUM A-D PO) Take by mouth.   Yes Historical Provider, MD  meclizine (ANTIVERT) 12.5 MG tablet Take 1 tablet (12.5 mg total) by mouth 3 (three) times daily as needed for dizziness. 04/06/16  Yes Glean Hess, MD  ondansetron (ZOFRAN) 4 MG tablet Take 1 tablet (4 mg total) by mouth every 8 (eight) hours as needed for nausea or vomiting. 04/06/16  Yes Glean Hess, MD  traMADol (ULTRAM) 50 MG tablet Take 1 tablet (50 mg total) by mouth every 8 (eight) hours as needed. 06/18/16  Yes Glean Hess, MD    Allergies  Allergen Reactions  . Hydrochlorothiazide     hypercalcemia  . Prednisone Other (See Comments)    Gets really hyper    Past Surgical History:  Procedure Laterality Date  . CARPAL TUNNEL RELEASE Right   . CATARACT EXTRACTION    . CHOLECYSTECTOMY  2013  . TOTAL SHOULDER REPLACEMENT Left 2013    Social History  Substance Use Topics  . Smoking status: Former Research scientist (life sciences)  .  Smokeless tobacco: Never Used  . Alcohol use No     Medication list has been reviewed and updated.   Physical Exam  Constitutional: She appears well-developed and well-nourished.  Cardiovascular: Normal rate, regular rhythm and normal heart sounds.   Pulmonary/Chest: Effort normal and breath sounds normal. No respiratory distress.  Abdominal: Soft. Bowel sounds are normal. There is tenderness in the suprapubic area.  There is no rebound, no guarding and no CVA tenderness.  Psychiatric: She has a normal mood and affect.  Nursing note and vitals reviewed.   BP 138/72   Pulse 86   Temp 97.6 F (36.4 C)   Ht 5\' 6"  (1.676 m)   Wt 175 lb (79.4 kg)   BMI 28.25 kg/m   Assessment and Plan: 1. Acute cystitis with hematuria Increase fluids Will advise if antibiotic change is needed - POCT urinalysis dipstick - Urine culture - sulfamethoxazole-trimethoprim (BACTRIM DS,SEPTRA DS) 800-160 MG tablet; Take 1 tablet by mouth 2 (two) times daily.  Dispense: 20 tablet; Refill: 0   Halina Maidens, MD [facility]  07/03/2016

## 2016-07-07 LAB — URINE CULTURE

## 2016-08-24 DIAGNOSIS — H5021 Vertical strabismus, right eye: Secondary | ICD-10-CM | POA: Diagnosis not present

## 2016-08-24 DIAGNOSIS — H532 Diplopia: Secondary | ICD-10-CM | POA: Diagnosis not present

## 2016-08-24 DIAGNOSIS — S0219XD Other fracture of base of skull, subsequent encounter for fracture with routine healing: Secondary | ICD-10-CM | POA: Diagnosis not present

## 2016-09-13 DIAGNOSIS — G4733 Obstructive sleep apnea (adult) (pediatric): Secondary | ICD-10-CM | POA: Diagnosis not present

## 2016-09-18 DIAGNOSIS — G4733 Obstructive sleep apnea (adult) (pediatric): Secondary | ICD-10-CM | POA: Insufficient documentation

## 2016-09-21 DIAGNOSIS — R69 Illness, unspecified: Secondary | ICD-10-CM | POA: Diagnosis not present

## 2016-10-28 DIAGNOSIS — N838 Other noninflammatory disorders of ovary, fallopian tube and broad ligament: Secondary | ICD-10-CM | POA: Diagnosis not present

## 2016-10-28 DIAGNOSIS — R938 Abnormal findings on diagnostic imaging of other specified body structures: Secondary | ICD-10-CM | POA: Diagnosis not present

## 2016-11-03 DIAGNOSIS — R69 Illness, unspecified: Secondary | ICD-10-CM | POA: Diagnosis not present

## 2016-12-09 DIAGNOSIS — S0219XD Other fracture of base of skull, subsequent encounter for fracture with routine healing: Secondary | ICD-10-CM | POA: Diagnosis not present

## 2016-12-18 DIAGNOSIS — Z4789 Encounter for other orthopedic aftercare: Secondary | ICD-10-CM | POA: Diagnosis not present

## 2016-12-18 DIAGNOSIS — S022XXD Fracture of nasal bones, subsequent encounter for fracture with routine healing: Secondary | ICD-10-CM | POA: Diagnosis not present

## 2016-12-18 DIAGNOSIS — S0219XD Other fracture of base of skull, subsequent encounter for fracture with routine healing: Secondary | ICD-10-CM | POA: Diagnosis not present

## 2016-12-18 DIAGNOSIS — X58XXXD Exposure to other specified factors, subsequent encounter: Secondary | ICD-10-CM | POA: Diagnosis not present

## 2016-12-28 DIAGNOSIS — G4733 Obstructive sleep apnea (adult) (pediatric): Secondary | ICD-10-CM | POA: Diagnosis not present

## 2017-01-19 DIAGNOSIS — M8580 Other specified disorders of bone density and structure, unspecified site: Secondary | ICD-10-CM | POA: Diagnosis not present

## 2017-01-19 DIAGNOSIS — Z0000 Encounter for general adult medical examination without abnormal findings: Secondary | ICD-10-CM | POA: Diagnosis not present

## 2017-01-19 DIAGNOSIS — Z8781 Personal history of (healed) traumatic fracture: Secondary | ICD-10-CM | POA: Diagnosis not present

## 2017-01-19 DIAGNOSIS — M130 Polyarthritis, unspecified: Secondary | ICD-10-CM | POA: Diagnosis not present

## 2017-01-19 DIAGNOSIS — R609 Edema, unspecified: Secondary | ICD-10-CM | POA: Diagnosis not present

## 2017-01-19 DIAGNOSIS — K219 Gastro-esophageal reflux disease without esophagitis: Secondary | ICD-10-CM | POA: Diagnosis not present

## 2017-01-19 DIAGNOSIS — M4850XD Collapsed vertebra, not elsewhere classified, site unspecified, subsequent encounter for fracture with routine healing: Secondary | ICD-10-CM | POA: Diagnosis not present

## 2017-01-19 DIAGNOSIS — Z6827 Body mass index (BMI) 27.0-27.9, adult: Secondary | ICD-10-CM | POA: Diagnosis not present

## 2017-01-27 ENCOUNTER — Encounter: Payer: Self-pay | Admitting: Internal Medicine

## 2017-01-27 ENCOUNTER — Ambulatory Visit (INDEPENDENT_AMBULATORY_CARE_PROVIDER_SITE_OTHER): Payer: Medicare HMO | Admitting: Internal Medicine

## 2017-01-27 VITALS — BP 128/70 | HR 72 | Ht 66.25 in | Wt 179.0 lb

## 2017-01-27 DIAGNOSIS — R197 Diarrhea, unspecified: Secondary | ICD-10-CM

## 2017-01-27 DIAGNOSIS — R300 Dysuria: Secondary | ICD-10-CM | POA: Diagnosis not present

## 2017-01-27 DIAGNOSIS — N3001 Acute cystitis with hematuria: Secondary | ICD-10-CM

## 2017-01-27 LAB — POC URINALYSIS WITH MICROSCOPIC (NON AUTO)MANUAL RESULT
BILIRUBIN UA: NEGATIVE
Crystals: 0
Glucose, UA: NEGATIVE
KETONES UA: NEGATIVE
MUCUS UA: 0
NITRITE UA: POSITIVE
PH UA: 7 (ref 5.0–8.0)
PROTEIN UA: NEGATIVE
RBC UA: NEGATIVE
RBC: 0 M/uL — AB (ref 4.04–5.48)
SPEC GRAV UA: 1.01 (ref 1.010–1.025)
UROBILINOGEN UA: 0.2 U/dL
WBC CASTS UA: 20

## 2017-01-27 MED ORDER — CHOLESTYRAMINE 4 G PO PACK: 4.0000 g | PACK | Freq: Two times a day (BID) | ORAL | 3 refills | Status: DC

## 2017-01-27 MED ORDER — SULFAMETHOXAZOLE-TRIMETHOPRIM 800-160 MG PO TABS: 1.0000 | ORAL_TABLET | Freq: Two times a day (BID) | ORAL | 0 refills | Status: DC

## 2017-01-27 NOTE — Progress Notes (Signed)
Date:  01/27/2017   Name:  Joanna Sanders   DOB:  13-Jan-1930   MRN:  [id number]   Chief Complaint: Urinary Tract Infection (Won't clear up. Cloudy urine. Pain in right side in flank area. And urinary frequency. Some burning. 3 weeks.) Urinary Tract Infection   This is a new problem. The current episode started 1 to 4 weeks ago. The problem occurs every urination. The problem has been unchanged. The quality of the pain is described as burning. The patient is experiencing no pain. There has been no fever. She is not sexually active. Associated symptoms include frequency and urgency. Pertinent negatives include no chills, hematuria, nausea or vomiting.     Review of Systems  Constitutional: Negative for chills, fatigue and fever.  Respiratory: Negative for chest tightness and shortness of breath.   Cardiovascular: Negative for chest pain.  Gastrointestinal: Negative for abdominal pain, nausea and vomiting.  Genitourinary: Positive for dysuria, frequency and urgency. Negative for difficulty urinating and hematuria.    Patient Active Problem List   Diagnosis Date Noted  . Chronic left shoulder pain 06/18/2016  . RLQ abdominal pain 04/29/2016  . 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 Age-related osteoporosis without current pathological fracture 04/06/2016  . Closed fracture of frontal sinus ([place]) 09/26/2015  . Nasal bone fracture 09/26/2015  . Allergic rhinitis 01/24/2015  . Benign essential HTN 01/24/2015  . HLD (hyperlipidemia) 01/24/2015  . Acid reflux 01/24/2015  . Generalized OA 01/24/2015  . Hyperparathyroidism, primary ([place]) 01/24/2015  . Diarrhea 01/24/2015  . OP (osteoporosis) 01/24/2015  . Avitaminosis D 01/24/2015    Prior to Admission medications   Medication Sig Start Date End Date Taking? Authorizing Provider  alendronate (FOSAMAX) 70 MG tablet Take 1 tablet by mouth once a week. 11/14/14  Yes [provider]  aspirin EC 81 MG tablet Take by mouth. 04/13/16  04/13/17 Yes [provider]  cholecalciferol (VITAMIN D) 1000 UNITS tablet Take 1 tablet by mouth daily.   Yes [provider]  cholestyramine (QUESTRAN) 4 g packet TAKE 1 PACKET BY MOUTH THREE TIMES DAILY WITH MEALS 06/18/16  Yes Glean Hess, MD  esomeprazole (NEXIUM) 40 MG capsule Take 40 mg by mouth daily at 12 noon.   Yes [provider]  furosemide (LASIX) 20 MG tablet Take 1 tablet (20 mg total) by mouth 2 (two) times daily. 02/12/16  Yes Glean Hess, MD  Loperamide HCl (IMODIUM A-D PO) Take by mouth.   Yes [provider]  meclizine (ANTIVERT) 12.5 MG tablet Take 1 tablet (12.5 mg total) by mouth 3 (three) times daily as needed for dizziness. 04/06/16  Yes Glean Hess, MD  ondansetron (ZOFRAN) 4 MG tablet Take 1 tablet (4 mg total) by mouth every 8 (eight) hours as needed for nausea or vomiting. 04/06/16  Yes Glean Hess, MD  traMADol (ULTRAM) 50 MG tablet Take 1 tablet (50 mg total) by mouth every 8 (eight) hours as needed. 06/18/16  Yes Glean Hess, MD    Allergies  Allergen Reactions  . Hydrochlorothiazide     hypercalcemia  . Prednisone Other (See Comments)    Gets really hyper    Past Surgical History:  Procedure Laterality Date  . CARPAL TUNNEL RELEASE Right   . CATARACT EXTRACTION    . CHOLECYSTECTOMY  2013  . TOTAL SHOULDER REPLACEMENT Left 2013    Social History  Substance Use Topics  . Smoking status: Former Research scientist (life sciences)  . Smokeless tobacco: Never Used  .  Alcohol use No     Medication list has been reviewed and updated.   Physical Exam  Constitutional: She appears well-developed and well-nourished.  Cardiovascular: Normal rate, regular rhythm and normal heart sounds.   Pulmonary/Chest: Effort normal and breath sounds normal. No respiratory distress.  Abdominal: Soft. Bowel sounds are normal. There is no tenderness. There is no rebound, no guarding and no CVA tenderness.  Psychiatric: She has a normal  mood and affect.  Nursing note and vitals reviewed.   BP 128/70   Pulse 72   Ht 5' 6.25" (1.683 m)   Wt 179 lb (81.2 kg)   SpO2 98%   BMI 28.67 kg/m   Assessment and Plan: 1. Diarrhea, unspecified type Improved with questran PRN - cholestyramine (QUESTRAN) 4 g packet; Take 1 packet (4 g total) by mouth 2 (two) times daily.  Dispense: 270 packet; Refill: 3  2. Dysuria - POC urinalysis w microscopic (non auto)  3. Acute cystitis with hematuria Increase fluids - sulfamethoxazole-trimethoprim (BACTRIM DS,SEPTRA DS) 800-160 MG tablet; Take 1 tablet by mouth 2 (two) times daily.  Dispense: 14 tablet; Refill: 0   Meds ordered this encounter  Medications  . cholestyramine (QUESTRAN) 4 g packet    Sig: Take 1 packet (4 g total) by mouth 2 (two) times daily.    Dispense:  270 packet    Refill:  3  . sulfamethoxazole-trimethoprim (BACTRIM DS,SEPTRA DS) 800-160 MG tablet    Sig: Take 1 tablet by mouth 2 (two) times daily.    Dispense:  14 tablet    Refill:  0    Halina Maidens, MD [facility]  01/27/2017

## 2017-02-12 ENCOUNTER — Telehealth: Payer: Self-pay

## 2017-02-12 ENCOUNTER — Other Ambulatory Visit: Payer: Self-pay | Admitting: Internal Medicine

## 2017-02-12 DIAGNOSIS — N3001 Acute cystitis with hematuria: Secondary | ICD-10-CM

## 2017-02-12 MED ORDER — SULFAMETHOXAZOLE-TRIMETHOPRIM 800-160 MG PO TABS: 1.0000 | ORAL_TABLET | Freq: Two times a day (BID) | ORAL | 0 refills | Status: AC

## 2017-02-12 NOTE — Telephone Encounter (Signed)
Spoke to patient. Gave instructions. Patient verbalized understanding.  

## 2017-02-12 NOTE — Telephone Encounter (Signed)
Patient left vmail stating she was dx(d) w/ UTI. Completed Bactrim. Started feeling better. Now sx(s) have returned.

## 2017-02-12 NOTE — Telephone Encounter (Signed)
I can send in another week of medication but if it recurs, she will need to be seen here again.

## 2017-02-16 DIAGNOSIS — G4733 Obstructive sleep apnea (adult) (pediatric): Secondary | ICD-10-CM | POA: Diagnosis not present

## 2017-02-16 DIAGNOSIS — J310 Chronic rhinitis: Secondary | ICD-10-CM | POA: Diagnosis not present

## 2017-02-22 ENCOUNTER — Ambulatory Visit: Payer: Medicare HMO

## 2017-02-22 VITALS — BP 130/72 | HR 64 | Temp 97.6°F | Resp 16 | Ht 66.0 in | Wt 175.8 lb

## 2017-02-22 DIAGNOSIS — Z1239 Encounter for other screening for malignant neoplasm of breast: Secondary | ICD-10-CM

## 2017-02-22 DIAGNOSIS — Z1231 Encounter for screening mammogram for malignant neoplasm of breast: Secondary | ICD-10-CM | POA: Diagnosis not present

## 2017-02-22 DIAGNOSIS — Z0000 Encounter for general adult medical examination without abnormal findings: Secondary | ICD-10-CM

## 2017-02-22 DIAGNOSIS — R300 Dysuria: Secondary | ICD-10-CM

## 2017-02-22 NOTE — Patient Instructions (Addendum)
Joanna Sanders , Thank you for taking time to come for your Medicare Wellness Visit. I appreciate your ongoing commitment to your health goals. Please review the following plan we discussed and let me know if I can assist you in the future.   Screening recommendations/referrals: Colonoscopy: no longer required Mammogram: completed 06/04/2016, no longer required Bone Density: completed 02/19/2015 Recommended yearly ophthalmology/optometry visit for glaucoma screening and checkup Recommended yearly dental visit for hygiene and checkup  Vaccinations: Influenza vaccine: up to date, due 03/2017 Pneumococcal vaccine:up to date Tdap vaccine:up to date Shingles vaccine: up to date  Advanced directives: Please bring a copy of your health care power of attorney and living will to the office at your convenience.  Conditions/risks identified: none   Next appointment: Follow up on 04/13/2017 at 10:30am with Dr.Berglund. Follow up in one year for your annual wellness exam.   Preventive Care 65 Years and Older, Female Preventive care refers to lifestyle choices and visits with your health care provider that can promote health and wellness. What does preventive care include?  A yearly physical exam. This is also called an annual well check.  Dental exams once or twice a year.  Routine eye exams. Ask your health care provider how often you should have your eyes checked.  Personal lifestyle choices, including:  Daily care of your teeth and gums.  Regular physical activity.  Eating a healthy diet.  Avoiding tobacco and drug use.  Limiting alcohol use.  Practicing safe sex.  Taking low-dose aspirin every day.  Taking vitamin and mineral supplements as recommended by your health care provider. What happens during an annual well check? The services and screenings done by your health care provider during your annual well check will depend on your age, overall health, lifestyle risk factors,  and family history of disease. Counseling  Your health care provider may ask you questions about your:  Alcohol use.  Tobacco use.  Drug use.  Emotional well-being.  Home and relationship well-being.  Sexual activity.  Eating habits.  History of falls.  Memory and ability to understand (cognition).  Work and work Statistician.  Reproductive health. Screening  You may have the following tests or measurements:  Height, weight, and BMI.  Blood pressure.  Lipid and cholesterol levels. These may be checked every 5 years, or more frequently if you are over 25 years old.  Skin check.  Lung cancer screening. You may have this screening every year starting at age 55 if you have a 30-pack-year history of smoking and currently smoke or have quit within the past 15 years.  Fecal occult blood test (FOBT) of the stool. You may have this test every year starting at age 33.  Flexible sigmoidoscopy or colonoscopy. You may have a sigmoidoscopy every 5 years or a colonoscopy every 10 years starting at age 85.  Hepatitis C blood test.  Hepatitis B blood test.  Sexually transmitted disease (STD) testing.  Diabetes screening. This is done by checking your blood sugar (glucose) after you have not eaten for a while (fasting). You may have this done every 1-3 years.  Bone density scan. This is done to screen for osteoporosis. You may have this done starting at age 68.  Mammogram. This may be done every 1-2 years. Talk to your health care provider about how often you should have regular mammograms. Talk with your health care provider about your test results, treatment options, and if necessary, the need for more tests. Vaccines  Your health care  provider may recommend certain vaccines, such as:  Influenza vaccine. This is recommended every year.  Tetanus, diphtheria, and acellular pertussis (Tdap, Td) vaccine. You may need a Td booster every 10 years.  Zoster vaccine. You may need  this after age 25.  Pneumococcal 13-valent conjugate (PCV13) vaccine. One dose is recommended after age 81.  Pneumococcal polysaccharide (PPSV23) vaccine. One dose is recommended after age 81. Talk to your health care provider about which screenings and vaccines you need and how often you need them. This information is not intended to replace advice given to you by your health care provider. Make sure you discuss any questions you have with your health care provider. Document Released: 07/19/2015 Document Revised: 03/11/2016 Document Reviewed: 04/23/2015 Elsevier Interactive Patient Education  2017 [place] Prevention in the Home Falls can cause injuries. They can happen to people of all ages. There are many things you can do to make your home safe and to help prevent falls. What can I do on the outside of my home?  Regularly fix the edges of walkways and driveways and fix any cracks.  Remove anything that might make you trip as you walk through a door, such as a raised step or threshold.  Trim any bushes or trees on the path to your home.  Use bright outdoor lighting.  Clear any walking paths of anything that might make someone trip, such as rocks or tools.  Regularly check to see if handrails are loose or broken. Make sure that both sides of any steps have handrails.  Any raised decks and porches should have guardrails on the edges.  Have any leaves, snow, or ice cleared regularly.  Use sand or salt on walking paths during winter.  Clean up any spills in your garage right away. This includes oil or grease spills. What can I do in the bathroom?  Use night lights.  Install grab bars by the toilet and in the tub and shower. Do not use towel bars as grab bars.  Use non-skid mats or decals in the tub or shower.  If you need to sit down in the shower, use a plastic, non-slip stool.  Keep the floor dry. Clean up any water that spills on the floor as soon as it  happens.  Remove soap buildup in the tub or shower regularly.  Attach bath mats securely with double-sided non-slip rug tape.  Do not have throw rugs and other things on the floor that can make you trip. What can I do in the bedroom?  Use night lights.  Make sure that you have a light by your bed that is easy to reach.  Do not use any sheets or blankets that are too big for your bed. They should not hang down onto the floor.  Have a firm chair that has side arms. You can use this for support while you get dressed.  Do not have throw rugs and other things on the floor that can make you trip. What can I do in the kitchen?  Clean up any spills right away.  Avoid walking on wet floors.  Keep items that you use a lot in easy-to-reach places.  If you need to reach something above you, use a strong step stool that has a grab bar.  Keep electrical cords out of the way.  Do not use floor polish or wax that makes floors slippery. If you must use wax, use non-skid floor wax.  Do not have throw  rugs and other things on the floor that can make you trip. What can I do with my stairs?  Do not leave any items on the stairs.  Make sure that there are handrails on both sides of the stairs and use them. Fix handrails that are broken or loose. Make sure that handrails are as long as the stairways.  Check any carpeting to make sure that it is firmly attached to the stairs. Fix any carpet that is loose or worn.  Avoid having throw rugs at the top or bottom of the stairs. If you do have throw rugs, attach them to the floor with carpet tape.  Make sure that you have a light switch at the top of the stairs and the bottom of the stairs. If you do not have them, ask someone to add them for you. What else can I do to help prevent falls?  Wear shoes that:  Do not have high heels.  Have rubber bottoms.  Are comfortable and fit you well.  Are closed at the toe. Do not wear sandals.  If you  use a stepladder:  Make sure that it is fully opened. Do not climb a closed stepladder.  Make sure that both sides of the stepladder are locked into place.  Ask someone to hold it for you, if possible.  Clearly mark and make sure that you can see:  Any grab bars or handrails.  First and last steps.  Where the edge of each step is.  Use tools that help you move around (mobility aids) if they are needed. These include:  Canes.  Walkers.  Scooters.  Crutches.  Turn on the lights when you go into a dark area. Replace any light bulbs as soon as they burn out.  Set up your furniture so you have a clear path. Avoid moving your furniture around.  If any of your floors are uneven, fix them.  If there are any pets around you, be aware of where they are.  Review your medicines with your doctor. Some medicines can make you feel dizzy. This can increase your chance of falling. Ask your doctor what other things that you can do to help prevent falls. This information is not intended to replace advice given to you by your health care provider. Make sure you discuss any questions you have with your health care provider. Document Released: 04/18/2009 Document Revised: 11/28/2015 Document Reviewed: 07/27/2014 Elsevier Interactive Patient Education  2017 [employer].

## 2017-02-22 NOTE — Progress Notes (Signed)
Subjective:   Joanna Sanders is a 83 y.o. female who presents for Medicare Annual (Subsequent) preventive examination.  Review of Systems:  Cardiac Risk Factors include: hypertension;advanced age (>51men, >55 women);dyslipidemia     Objective:     Vitals: BP 130/72 (BP Location: Left Arm, Patient Position: Sitting)   Pulse 64   Temp 97.6 F (36.4 C)   Resp 16   Ht 5\' 6"  (1.676 m)   Wt 175 lb 12.8 oz (79.7 kg)   BMI 28.37 kg/m   Body mass index is 28.37 kg/m.   Tobacco History  Smoking Status  . Former Smoker  . Quit date: 07/06/1996  Smokeless Tobacco  . Never Used     Counseling given: Not Answered   Past Medical History:  Diagnosis Date  . GERD (gastroesophageal reflux disease)   . Hyperlipidemia   . Hypertension   . Sleep apnea    Past Surgical History:  Procedure Laterality Date  . CARPAL TUNNEL RELEASE Right   . CATARACT EXTRACTION    . CHOLECYSTECTOMY  2013  . TOTAL SHOULDER REPLACEMENT Left 2013   Family History  Problem Relation Age of Onset  . Hypertension Mother   . Leukemia Mother   . Heart failure Father   . Breast cancer Daughter 64   History  Sexual Activity  . Sexual activity: Not on file    Outpatient Encounter Prescriptions as of 02/22/2017  Medication Sig  . alendronate (FOSAMAX) 70 MG tablet Take 1 tablet by mouth once a week.  Marland Kitchen aspirin EC 81 MG tablet Take by mouth.  . cholecalciferol (VITAMIN D) 1000 UNITS tablet Take 1 tablet by mouth daily.  . cholestyramine (QUESTRAN) 4 g packet Take 1 packet (4 g total) by mouth 2 (two) times daily.  Marland Kitchen esomeprazole (NEXIUM) 40 MG capsule Take 40 mg by mouth daily at 12 noon.  . furosemide (LASIX) 20 MG tablet Take 1 tablet (20 mg total) by mouth 2 (two) times daily.  . Loperamide HCl (IMODIUM A-D PO) Take by mouth.  . meclizine (ANTIVERT) 12.5 MG tablet Take 1 tablet (12.5 mg total) by mouth 3 (three) times daily as needed for dizziness.  . ondansetron (ZOFRAN) 4 MG tablet Take 1  tablet (4 mg total) by mouth every 8 (eight) hours as needed for nausea or vomiting.  . traMADol (ULTRAM) 50 MG tablet Take 1 tablet (50 mg total) by mouth every 8 (eight) hours as needed.   No facility-administered encounter medications on file as of 02/22/2017.     Activities of Daily Living In your present state of health, do you have any difficulty performing the following activities: 02/22/2017 04/06/2016  Hearing? [place]? N N  Difficulty concentrating or making decisions? N N  Walking or climbing stairs? N N  Dressing or bathing? N N  Doing errands, shopping? N N  Preparing Food and eating ? N N  Using the Toilet? N N  In the past six months, have you accidently leaked urine? Y Y  Comment pad away from home -  Do you have problems with loss of bowel control? N N  Managing your Medications? N N  Managing your Finances? N N  Housekeeping or managing your Housekeeping? N N  Some recent data might be hidden    Patient Care Team: Glean Hess, MD as PCP - General (Internal Medicine) Gale Journey Gwen Her, MD as Referring Physician (Endocrinology)    Assessment:     Exercise Activities and Dietary  recommendations Current Exercise Habits: Home exercise routine, Time (Minutes): 20, Frequency (Times/Week): 6, Weekly Exercise (Minutes/Week): 120, Intensity: Mild, Exercise limited by: None identified  Goals    None     Fall Risk Fall Risk  02/22/2017 04/06/2016 02/12/2016 01/25/2015  Falls in the past year? No Yes Yes No  Number falls in past yr: - 1 1 -  Injury with Fall? - Yes No -  Risk for fall due to : - - Impaired balance/gait -  Follow up - Follow up appointment Falls prevention discussed -   Depression Screen PHQ 2/9 Scores 02/22/2017 04/06/2016 02/12/2016 01/25/2015  PHQ - 2 Score 0 0 0 0     Cognitive Function     6CIT Screen 02/22/2017 04/06/2016  What Year? 0 points 0 points  What month? 0 points 0 points  What time? 0 points 0 points  Count back from 20 0  points 0 points  Months in reverse 0 points 0 points  Repeat phrase 0 points 0 points  Total Score 0 0    Immunization History  Administered Date(s) Administered  . Influenza,inj,Quad PF,36+ Mos 04/06/2016  . Influenza-Unspecified 02/18/2015  . Pneumococcal Conjugate-13 03/20/2014  . Pneumococcal Polysaccharide-23 07/07/2005  . Tdap 09/26/2015  . Zoster 07/08/2007   Screening Tests Health Maintenance  Topic Date Due  . INFLUENZA VACCINE  02/03/2017  . MAMMOGRAM  06/03/2017  . COLONOSCOPY  01/14/2019  . TETANUS/TDAP  09/25/2025  . DEXA SCAN  Addressed  . PNA vac Low Risk Adult  Completed      Plan:     I have personally reviewed and addressed the Medicare Annual Wellness questionnaire and have noted the following in the patient's chart:  A. Medical and social history B. Use of alcohol, tobacco or illicit drugs  C. Current medications and supplements D. Functional ability and status E.  Nutritional status F.  Physical activity G. Advance directives H. List of other physicians I.  Hospitalizations, surgeries, and ER visits in previous 12 months J.  [place] such as hearing and vision if needed, cognitive and depression L. Referrals and appointments   In addition, I have reviewed and discussed with patient certain preventive protocols, quality metrics, and best practice recommendations. A written personalized care plan for preventive services as well as general preventive health recommendations were provided to patient.   Signed,  Tyler Aas, LPN Nurse Health Advisor   MD Recommendations: none

## 2017-02-23 DIAGNOSIS — G4733 Obstructive sleep apnea (adult) (pediatric): Secondary | ICD-10-CM | POA: Diagnosis not present

## 2017-02-25 ENCOUNTER — Other Ambulatory Visit: Payer: Self-pay | Admitting: Internal Medicine

## 2017-02-25 LAB — URINE CULTURE

## 2017-02-25 MED ORDER — CIPROFLOXACIN HCL 250 MG PO TABS: 250.0000 mg | ORAL_TABLET | Freq: Two times a day (BID) | ORAL | 0 refills | Status: AC

## 2017-03-02 DIAGNOSIS — R69 Illness, unspecified: Secondary | ICD-10-CM | POA: Diagnosis not present

## 2017-03-04 DIAGNOSIS — Z8781 Personal history of (healed) traumatic fracture: Secondary | ICD-10-CM | POA: Diagnosis not present

## 2017-03-04 DIAGNOSIS — M810 Age-related osteoporosis without current pathological fracture: Secondary | ICD-10-CM | POA: Diagnosis not present

## 2017-03-04 DIAGNOSIS — N390 Urinary tract infection, site not specified: Secondary | ICD-10-CM | POA: Diagnosis not present

## 2017-03-04 DIAGNOSIS — E210 Primary hyperparathyroidism: Secondary | ICD-10-CM | POA: Diagnosis not present

## 2017-03-04 DIAGNOSIS — B952 Enterococcus as the cause of diseases classified elsewhere: Secondary | ICD-10-CM | POA: Diagnosis not present

## 2017-03-26 DIAGNOSIS — G4733 Obstructive sleep apnea (adult) (pediatric): Secondary | ICD-10-CM | POA: Diagnosis not present

## 2017-04-13 ENCOUNTER — Ambulatory Visit (INDEPENDENT_AMBULATORY_CARE_PROVIDER_SITE_OTHER): Payer: Medicare HMO | Admitting: Internal Medicine

## 2017-04-13 ENCOUNTER — Encounter: Payer: Self-pay | Admitting: Internal Medicine

## 2017-04-13 VITALS — BP 122/74 | HR 71 | Temp 97.9°F | Ht 66.0 in | Wt 176.0 lb

## 2017-04-13 DIAGNOSIS — M810 Age-related osteoporosis without current pathological fracture: Secondary | ICD-10-CM

## 2017-04-13 DIAGNOSIS — E559 Vitamin D deficiency, unspecified: Secondary | ICD-10-CM

## 2017-04-13 DIAGNOSIS — I10 Essential (primary) hypertension: Secondary | ICD-10-CM | POA: Diagnosis not present

## 2017-04-13 DIAGNOSIS — Z0000 Encounter for general adult medical examination without abnormal findings: Secondary | ICD-10-CM

## 2017-04-13 DIAGNOSIS — E210 Primary hyperparathyroidism: Secondary | ICD-10-CM | POA: Diagnosis not present

## 2017-04-13 DIAGNOSIS — Z23 Encounter for immunization: Secondary | ICD-10-CM | POA: Diagnosis not present

## 2017-04-13 DIAGNOSIS — E782 Mixed hyperlipidemia: Secondary | ICD-10-CM

## 2017-04-13 MED ORDER — FUROSEMIDE 20 MG PO TABS: 20.0000 mg | ORAL_TABLET | Freq: Two times a day (BID) | ORAL | 3 refills | Status: DC

## 2017-04-13 MED ORDER — ALENDRONATE SODIUM 70 MG PO TABS: 70.0000 mg | ORAL_TABLET | ORAL | 3 refills | Status: DC

## 2017-04-13 NOTE — Progress Notes (Signed)
Date:  04/13/2017   Name:  Joanna Sanders   DOB:  Nov 30, 1929   MRN:  [id number]   Chief Complaint: Annual Exam (Breast Exam. ) and Immunizations (High Dose. )  Joanna Sanders is a 83 y.o. female who presents today for her Complete Annual Exam. She feels fairly well. She reports exercising walking. She reports she is sleeping well.   Hypertension  Pertinent negatives include no chest pain, headaches, palpitations or shortness of breath.  Hyperlipidemia  Recent lipid tests were reviewed and are variable. Pertinent negatives include no chest pain or shortness of breath. Current antihyperlipidemic treatment includes diet change and bile acid squestrants.   Hyperparathyroidism - now being followed at [facility]. Last PTH was higher and calcium was 11.4. She's due for a follow-up in 3-6 months at that time will discuss surgery. She denies muscle spasms cramps or kidney stones. She is on Lasix daily to help control calcium levels.  Post cholecystectomy diarrhea - now on Questran and Imodium as needed. Diarrhea is fairly well controlled. She is able to do activities and leave the house whenever she needs to.  Osteoporosis - she continues on Fosamax weekly. No recent fracture or issues with back pain. No side effects such as swallowing epigastric discomfort or abdominal pain.  Review of Systems  Constitutional: Negative for chills, fatigue and fever.  HENT: Negative for congestion, hearing loss, tinnitus, trouble swallowing and voice change.   Eyes: Negative for visual disturbance.  Respiratory: Negative for cough, chest tightness, shortness of breath and wheezing.   Cardiovascular: Negative for chest pain, palpitations and leg swelling.  Gastrointestinal: Positive for diarrhea. Negative for abdominal pain, anal bleeding, blood in stool, constipation and vomiting.  Endocrine: Negative for polydipsia and polyuria.  Genitourinary: Negative for dysuria, frequency, genital sores, vaginal  bleeding and vaginal discharge.  Musculoskeletal: Negative for arthralgias, gait problem and joint swelling.  Skin: Negative for color change and rash.  Neurological: Negative for dizziness, tremors, light-headedness and headaches.  Hematological: Negative for adenopathy. Does not bruise/bleed easily.  Psychiatric/Behavioral: Negative for dysphoric mood and sleep disturbance. The patient is not nervous/anxious.     Patient Active Problem List   Diagnosis Date Noted  . Chronic left shoulder pain 06/18/2016  . RLQ abdominal pain 04/29/2016  . 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 Age-related osteoporosis without current pathological fracture 04/06/2016  . Closed fracture of frontal sinus ([place]) 09/26/2015  . Nasal bone fracture 09/26/2015  . Allergic rhinitis 01/24/2015  . Benign essential HTN 01/24/2015  . HLD (hyperlipidemia) 01/24/2015  . Acid reflux 01/24/2015  . Generalized OA 01/24/2015  . Hyperparathyroidism, primary ([place]) 01/24/2015  . Diarrhea 01/24/2015  . OP (osteoporosis) 01/24/2015  . Avitaminosis D 01/24/2015    Prior to Admission medications   Medication Sig Start Date End Date Taking? Authorizing Provider  alendronate (FOSAMAX) 70 MG tablet Take 1 tablet by mouth once a week. 11/14/14  Yes [provider]  aspirin EC 81 MG tablet Take by mouth. 04/13/16 04/13/17 Yes [provider]  cholecalciferol (VITAMIN D) 1000 UNITS tablet Take 1 tablet by mouth daily.   Yes [provider]  cholestyramine (QUESTRAN) 4 g packet Take 1 packet (4 g total) by mouth 2 (two) times daily. 01/27/17  Yes Glean Hess, MD  esomeprazole (NEXIUM) 40 MG capsule Take 40 mg by mouth daily at 12 noon.   Yes [provider]  furosemide (LASIX) 20 MG tablet Take 1 tablet (20 mg total) by mouth 2 (two) times  daily. 02/12/16  Yes Glean Hess, MD  Loperamide HCl (IMODIUM A-D PO) Take by mouth.   Yes [provider]  meclizine (ANTIVERT) 12.5 MG tablet  Take 1 tablet (12.5 mg total) by mouth 3 (three) times daily as needed for dizziness. 04/06/16  Yes Glean Hess, MD  ondansetron (ZOFRAN) 4 MG tablet Take 1 tablet (4 mg total) by mouth every 8 (eight) hours as needed for nausea or vomiting. 04/06/16  Yes Glean Hess, MD  traMADol (ULTRAM) 50 MG tablet Take 1 tablet (50 mg total) by mouth every 8 (eight) hours as needed. 06/18/16  Yes Glean Hess, MD    Allergies  Allergen Reactions  . Hydrochlorothiazide     hypercalcemia  . Prednisone Other (See Comments)    Gets really hyper    Past Surgical History:  Procedure Laterality Date  . CARPAL TUNNEL RELEASE Right   . CATARACT EXTRACTION    . CHOLECYSTECTOMY  2013  . TOTAL SHOULDER REPLACEMENT Left 2013    Social History  Substance Use Topics  . Smoking status: Former Smoker    Quit date: 07/06/1996  . Smokeless tobacco: Never Used  . Alcohol use Not on file     Medication list has been reviewed and updated.  PHQ 2/9 Scores 02/22/2017 04/06/2016 02/12/2016 01/25/2015  PHQ - 2 Score 0 0 0 0    Physical Exam  Constitutional: She is oriented to person, place, and time. She appears well-developed and well-nourished. No distress.  HENT:  Head: Normocephalic and atraumatic.  Right Ear: Tympanic membrane and ear canal normal.  Left Ear: Tympanic membrane and ear canal normal.  Nose: Right sinus exhibits no maxillary sinus tenderness. Left sinus exhibits no maxillary sinus tenderness.  Mouth/Throat: Uvula is midline and oropharynx is clear and moist.  Eyes: Conjunctivae and EOM are normal. Right eye exhibits no discharge. Left eye exhibits no discharge. No scleral icterus.  Neck: Normal range of motion. Carotid bruit is not present. No erythema present. No thyromegaly present.  Cardiovascular: Normal rate, regular rhythm, normal heart sounds and normal pulses.   Pulmonary/Chest: Effort normal. No respiratory distress. She has no wheezes. Right breast exhibits no mass, no  nipple discharge, no skin change and no tenderness. Left breast exhibits no mass, no nipple discharge, no skin change and no tenderness.  Abdominal: Soft. Bowel sounds are normal. There is no hepatosplenomegaly. There is no tenderness. There is no CVA tenderness.  Musculoskeletal: Normal range of motion. She exhibits no edema or tenderness.  Lymphadenopathy:    She has no cervical adenopathy.    She has no axillary adenopathy.  Neurological: She is alert and oriented to person, place, and time. She has normal reflexes. No cranial nerve deficit or sensory deficit.  Skin: Skin is warm, dry and intact. No rash noted.  Psychiatric: She has a normal mood and affect. Her speech is normal and behavior is normal. Thought content normal.  Nursing note and vitals reviewed.   BP 122/74 (BP Location: Right Arm, Patient Position: Sitting, Cuff Size: Normal)   Pulse 71   Temp 97.9 F (36.6 C) (Oral)   Ht 5\' 6"  (1.676 m)   Wt 176 lb (79.8 kg)   SpO2 96%   BMI 28.41 kg/m   Assessment and Plan: 1. Annual physical exam MAW completed Pt given Rx to get Shingrix vaccines - Hemoglobin A1c - POCT urinalysis dipstick  2. Benign essential HTN controlled - CBC with Differential/Platelet - TSH - furosemide (LASIX) 20 MG  tablet; Take 1 tablet (20 mg total) by mouth 2 (two) times daily.  Dispense: 180 tablet; Refill: 3  3. Hyperparathyroidism, primary ([place]) Follow up with Endo as planned - Comprehensive metabolic panel  4. Age-related osteoporosis without current pathological fracture Continue fosamax and vitamin D  5. Mixed hyperlipidemia Continue diet, questran - Lipid panel  6. Avitaminosis D supplemented  7. Need for influenza vaccination - Flu vaccine HIGH DOSE PF   Meds ordered this encounter  Medications  . furosemide (LASIX) 20 MG tablet    Sig: Take 1 tablet (20 mg total) by mouth 2 (two) times daily.    Dispense:  180 tablet    Refill:  3  . alendronate (FOSAMAX) 70 MG tablet     Sig: Take 1 tablet (70 mg total) by mouth once a week.    Dispense:  90 tablet    Refill:  3    Partially dictated using Editor, commissioning. Any errors are unintentional.  Halina Maidens, MD [facility]  04/13/2017

## 2017-04-14 LAB — CBC WITH DIFFERENTIAL/PLATELET
Basophils Absolute: 0 10*3/uL (ref 0.0–0.2)
Basos: 1 %
EOS (ABSOLUTE): 0.1 10*3/uL (ref 0.0–0.4)
EOS: 2 %
HEMATOCRIT: 34.8 % (ref 34.0–46.6)
Hemoglobin: 11.9 g/dL (ref 11.1–15.9)
Immature Grans (Abs): 0 10*3/uL (ref 0.0–0.1)
Immature Granulocytes: 0 %
LYMPHS ABS: 2.4 10*3/uL (ref 0.7–3.1)
Lymphs: 42 %
MCH: 31.1 pg (ref 26.6–33.0)
MCHC: 34.2 g/dL (ref 31.5–35.7)
MCV: 91 fL (ref 79–97)
MONOCYTES: 11 %
MONOS ABS: 0.7 10*3/uL (ref 0.1–0.9)
NEUTROS PCT: 44 %
Neutrophils Absolute: 2.5 10*3/uL (ref 1.4–7.0)
Platelets: 255 10*3/uL (ref 150–379)
RBC: 3.83 x10E6/uL (ref 3.77–5.28)
RDW: 14.1 % (ref 12.3–15.4)
WBC: 5.8 10*3/uL (ref 3.4–10.8)

## 2017-04-14 LAB — TSH: TSH: 1.75 u[IU]/mL (ref 0.450–4.500)

## 2017-04-14 LAB — LIPID PANEL
Chol/HDL Ratio: 3.5 ratio (ref 0.0–4.4)
Cholesterol, Total: 201 mg/dL — ABNORMAL HIGH (ref 100–199)
HDL: 57 mg/dL (ref >39)
LDL Calculated: 123 mg/dL — ABNORMAL HIGH (ref 0–99)
Triglycerides: 107 mg/dL (ref 0–149)
VLDL Cholesterol Cal: 21 mg/dL (ref 5–40)

## 2017-04-14 LAB — HEMOGLOBIN A1C
ESTIMATED AVERAGE GLUCOSE: 120 mg/dL
HEMOGLOBIN A1C: 5.8 % — AB (ref 4.8–5.6)

## 2017-04-14 LAB — COMPREHENSIVE METABOLIC PANEL
ALK PHOS: 101 IU/L (ref 39–117)
ALT: 20 IU/L (ref 0–32)
AST: 20 IU/L (ref 0–40)
Albumin/Globulin Ratio: 2.1 (ref 1.2–2.2)
Albumin: 4.7 g/dL (ref 3.5–4.7)
BUN/Creatinine Ratio: 19 (ref 12–28)
BUN: 16 mg/dL (ref 8–27)
Bilirubin Total: 0.5 mg/dL (ref 0.0–1.2)
CHLORIDE: 101 mmol/L (ref 96–106)
CO2: 24 mmol/L (ref 20–29)
CREATININE: 0.86 mg/dL (ref 0.57–1.00)
Calcium: 10.9 mg/dL — ABNORMAL HIGH (ref 8.7–10.3)
GFR calc Af Amer: 71 mL/min/{1.73_m2} (ref >59)
GFR calc non Af Amer: 62 mL/min/{1.73_m2} (ref >59)
GLOBULIN, TOTAL: 2.2 g/dL (ref 1.5–4.5)
Glucose: 92 mg/dL (ref 65–99)
POTASSIUM: 4.3 mmol/L (ref 3.5–5.2)
SODIUM: 138 mmol/L (ref 134–144)
Total Protein: 6.9 g/dL (ref 6.0–8.5)

## 2017-04-25 DIAGNOSIS — G4733 Obstructive sleep apnea (adult) (pediatric): Secondary | ICD-10-CM | POA: Diagnosis not present

## 2017-05-10 DIAGNOSIS — H532 Diplopia: Secondary | ICD-10-CM | POA: Diagnosis not present

## 2017-05-10 DIAGNOSIS — H5021 Vertical strabismus, right eye: Secondary | ICD-10-CM | POA: Diagnosis not present

## 2017-05-10 DIAGNOSIS — H5203 Hypermetropia, bilateral: Secondary | ICD-10-CM | POA: Diagnosis not present

## 2017-05-10 DIAGNOSIS — H524 Presbyopia: Secondary | ICD-10-CM | POA: Diagnosis not present

## 2017-05-10 DIAGNOSIS — H52223 Regular astigmatism, bilateral: Secondary | ICD-10-CM | POA: Diagnosis not present

## 2017-05-18 DIAGNOSIS — J310 Chronic rhinitis: Secondary | ICD-10-CM | POA: Diagnosis not present

## 2017-05-18 DIAGNOSIS — G4733 Obstructive sleep apnea (adult) (pediatric): Secondary | ICD-10-CM | POA: Diagnosis not present

## 2017-05-26 DIAGNOSIS — G4733 Obstructive sleep apnea (adult) (pediatric): Secondary | ICD-10-CM | POA: Diagnosis not present

## 2017-06-07 ENCOUNTER — Ambulatory Visit
Admission: RE | Admit: 2017-06-07 | Discharge: 2017-06-07 | Disposition: A | Discharge status: Home / Self Care | Payer: Medicare HMO | Source: Ambulatory Visit | Attending: Internal Medicine | Admitting: Internal Medicine

## 2017-06-07 DIAGNOSIS — Z1239 Encounter for other screening for malignant neoplasm of breast: Secondary | ICD-10-CM

## 2017-06-07 DIAGNOSIS — Z1231 Encounter for screening mammogram for malignant neoplasm of breast: Secondary | ICD-10-CM | POA: Insufficient documentation

## 2017-06-09 DIAGNOSIS — Z86010 Personal history of colonic polyps: Secondary | ICD-10-CM | POA: Diagnosis not present

## 2017-06-09 DIAGNOSIS — Z9889 Other specified postprocedural states: Secondary | ICD-10-CM | POA: Diagnosis not present

## 2017-06-09 DIAGNOSIS — R1011 Right upper quadrant pain: Secondary | ICD-10-CM | POA: Diagnosis not present

## 2017-06-09 DIAGNOSIS — R197 Diarrhea, unspecified: Secondary | ICD-10-CM | POA: Diagnosis not present

## 2017-06-09 DIAGNOSIS — R1310 Dysphagia, unspecified: Secondary | ICD-10-CM | POA: Diagnosis not present

## 2017-06-12 DIAGNOSIS — R69 Illness, unspecified: Secondary | ICD-10-CM | POA: Diagnosis not present

## 2017-06-17 DIAGNOSIS — R1310 Dysphagia, unspecified: Secondary | ICD-10-CM | POA: Diagnosis not present

## 2017-06-17 DIAGNOSIS — K219 Gastro-esophageal reflux disease without esophagitis: Secondary | ICD-10-CM | POA: Diagnosis not present

## 2017-06-25 DIAGNOSIS — G4733 Obstructive sleep apnea (adult) (pediatric): Secondary | ICD-10-CM | POA: Diagnosis not present

## 2017-07-20 DIAGNOSIS — R933 Abnormal findings on diagnostic imaging of other parts of digestive tract: Secondary | ICD-10-CM | POA: Diagnosis not present

## 2017-07-20 DIAGNOSIS — R1011 Right upper quadrant pain: Secondary | ICD-10-CM | POA: Diagnosis not present

## 2017-07-26 DIAGNOSIS — G4733 Obstructive sleep apnea (adult) (pediatric): Secondary | ICD-10-CM | POA: Diagnosis not present

## 2017-08-18 DIAGNOSIS — Z9049 Acquired absence of other specified parts of digestive tract: Secondary | ICD-10-CM | POA: Diagnosis not present

## 2017-08-18 DIAGNOSIS — R197 Diarrhea, unspecified: Secondary | ICD-10-CM | POA: Diagnosis not present

## 2017-08-18 DIAGNOSIS — K219 Gastro-esophageal reflux disease without esophagitis: Secondary | ICD-10-CM | POA: Diagnosis not present

## 2017-08-18 DIAGNOSIS — R935 Abnormal findings on diagnostic imaging of other abdominal regions, including retroperitoneum: Secondary | ICD-10-CM | POA: Diagnosis not present

## 2017-08-26 DIAGNOSIS — G4733 Obstructive sleep apnea (adult) (pediatric): Secondary | ICD-10-CM | POA: Diagnosis not present

## 2017-09-01 ENCOUNTER — Encounter: Payer: Self-pay | Admitting: Internal Medicine

## 2017-09-01 ENCOUNTER — Ambulatory Visit (INDEPENDENT_AMBULATORY_CARE_PROVIDER_SITE_OTHER): Payer: Medicare HMO | Admitting: Internal Medicine

## 2017-09-01 VITALS — BP 134/62 | HR 90 | Ht 66.0 in | Wt 172.0 lb

## 2017-09-01 DIAGNOSIS — N3000 Acute cystitis without hematuria: Secondary | ICD-10-CM | POA: Diagnosis not present

## 2017-09-01 MED ORDER — NITROFURANTOIN MONOHYD MACRO 100 MG PO CAPS: 100.0000 mg | ORAL_CAPSULE | Freq: Two times a day (BID) | ORAL | 0 refills | Status: DC

## 2017-09-01 NOTE — Progress Notes (Signed)
Date:  09/01/2017   Name:  Joanna Sanders   DOB:  1929-11-19   MRN:  [id number]   Chief Complaint: Urinary Tract Infection (Urgency, frequency, and burning during urination. X 2 weeks off and on. )  Urinary Tract Infection   This is a new problem. The current episode started 1 to 4 weeks ago. The problem occurs every urination. The problem has been unchanged. The quality of the pain is described as burning. The pain is at a severity of 1/10. The pain is mild. There has been no fever. Associated symptoms include frequency and urgency. Pertinent negatives include no chills or hematuria.     Review of Systems  Constitutional: Negative for chills, fatigue and fever.  Respiratory: Negative for shortness of breath and wheezing.   Cardiovascular: Negative for chest pain and palpitations.  Genitourinary: Positive for dysuria, frequency and urgency. Negative for decreased urine volume and hematuria.    Patient Active Problem List   Diagnosis Date Noted  . Chronic left shoulder pain 06/18/2016  . Abdominal pain, chronic, right upper quadrant 04/29/2016  . 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 Age-related osteoporosis without current pathological fracture 04/06/2016  . Allergic rhinitis 01/24/2015  . Benign essential HTN 01/24/2015  . HLD (hyperlipidemia) 01/24/2015  . Acid reflux 01/24/2015  . Generalized OA 01/24/2015  . Hyperparathyroidism, primary ([place]) 01/24/2015  . Avitaminosis D 01/24/2015    Prior to Admission medications   Medication Sig Start Date End Date Taking? Authorizing Provider  alendronate (FOSAMAX) 70 MG tablet Take 1 tablet (70 mg total) by mouth once a week. 04/13/17  Yes Glean Hess, MD  cholecalciferol (VITAMIN D) 1000 UNITS tablet Take 1 tablet by mouth daily.   Yes [provider]  cholestyramine (QUESTRAN) 4 g packet Take 1 packet (4 g total) by mouth 2 (two) times daily. 01/27/17  Yes Glean Hess, MD  esomeprazole (NEXIUM) 40 MG capsule  Take 40 mg by mouth daily at 12 noon.   Yes [provider]  furosemide (LASIX) 20 MG tablet Take 1 tablet (20 mg total) by mouth 2 (two) times daily. 04/13/17  Yes Glean Hess, MD  Loperamide HCl (IMODIUM A-D PO) Take by mouth.   Yes [provider]  meclizine (ANTIVERT) 12.5 MG tablet Take 1 tablet (12.5 mg total) by mouth 3 (three) times daily as needed for dizziness. 04/06/16  Yes Glean Hess, MD  ondansetron (ZOFRAN) 4 MG tablet Take 1 tablet (4 mg total) by mouth every 8 (eight) hours as needed for nausea or vomiting. 04/06/16  Yes Glean Hess, MD  nitrofurantoin, macrocrystal-monohydrate, (MACROBID) 100 MG capsule Take 1 capsule (100 mg total) by mouth 2 (two) times daily for 7 days. 09/01/17 09/08/17  Glean Hess, MD    Allergies  Allergen Reactions  . Hydrochlorothiazide     hypercalcemia  . Prednisone Other (See Comments)    Gets really hyper    Past Surgical History:  Procedure Laterality Date  . CARPAL TUNNEL RELEASE Right   . CATARACT EXTRACTION    . CHOLECYSTECTOMY  2013  . TOTAL SHOULDER REPLACEMENT Left 2013    Social History   Tobacco Use  . Smoking status: Former Smoker    Last attempt to quit: 07/06/1996    Years since quitting: 21.1  . Smokeless tobacco: Never Used  Substance Use Topics  . Alcohol use: Not on file  . Drug use: No     Medication list has been reviewed and updated.  PHQ 2/9 Scores 02/22/2017 04/06/2016 02/12/2016 01/25/2015  PHQ - 2 Score 0 0 0 0    Physical Exam  Constitutional: She appears well-developed and well-nourished.  Cardiovascular: Normal rate, regular rhythm and normal heart sounds.  Pulmonary/Chest: Effort normal and breath sounds normal. No respiratory distress.  Abdominal: Soft. Bowel sounds are normal. There is tenderness in the suprapubic area. There is no rebound, no guarding and no CVA tenderness.  Psychiatric: She has a normal mood and affect.  Nursing note and vitals reviewed.   BP  134/62   Pulse 90   Ht 5\' 6"  (1.676 m)   Wt 172 lb (78 kg)   SpO2 98%   BMI 27.76 kg/m   Assessment and Plan: 1. Acute cystitis without hematuria Pt unable to provide a specimen so will treat empirically and have her return if sx worsen - nitrofurantoin, macrocrystal-monohydrate, (MACROBID) 100 MG capsule; Take 1 capsule (100 mg total) by mouth 2 (two) times daily for 7 days.  Dispense: 14 capsule; Refill: 0   Meds ordered this encounter  Medications  . nitrofurantoin, macrocrystal-monohydrate, (MACROBID) 100 MG capsule    Sig: Take 1 capsule (100 mg total) by mouth 2 (two) times daily for 7 days.    Dispense:  14 capsule    Refill:  0    Partially dictated using Editor, commissioning. Any errors are unintentional.  Halina Maidens, MD [facility]  09/01/2017

## 2017-09-06 ENCOUNTER — Telehealth: Payer: Self-pay

## 2017-09-06 ENCOUNTER — Other Ambulatory Visit: Payer: Self-pay | Admitting: Internal Medicine

## 2017-09-06 MED ORDER — CEFUROXIME AXETIL 250 MG PO TABS: 250.0000 mg | ORAL_TABLET | Freq: Two times a day (BID) | ORAL | 0 refills | Status: DC

## 2017-09-06 NOTE — Telephone Encounter (Signed)
I sent in Ceftin 250 mg bid for 7 days.

## 2017-09-06 NOTE — Telephone Encounter (Signed)
Patient informed. 

## 2017-09-06 NOTE — Telephone Encounter (Signed)
Patient called stating she was given antibiotic for UTI at visit last week. She had a reaction to it and had to stop taking it Saturday-Severe diarrhea and dizziness. [place], [place]  Please Advise.

## 2017-09-14 DIAGNOSIS — E210 Primary hyperparathyroidism: Secondary | ICD-10-CM | POA: Diagnosis not present

## 2017-09-14 DIAGNOSIS — M810 Age-related osteoporosis without current pathological fracture: Secondary | ICD-10-CM | POA: Diagnosis not present

## 2017-09-23 DIAGNOSIS — G4733 Obstructive sleep apnea (adult) (pediatric): Secondary | ICD-10-CM | POA: Diagnosis not present

## 2017-10-07 DIAGNOSIS — G4733 Obstructive sleep apnea (adult) (pediatric): Secondary | ICD-10-CM | POA: Diagnosis not present

## 2017-10-07 DIAGNOSIS — J310 Chronic rhinitis: Secondary | ICD-10-CM | POA: Diagnosis not present

## 2017-10-12 ENCOUNTER — Other Ambulatory Visit: Payer: Self-pay | Admitting: Internal Medicine

## 2017-10-12 ENCOUNTER — Ambulatory Visit: Payer: Self-pay | Admitting: Internal Medicine

## 2017-10-12 ENCOUNTER — Ambulatory Visit (INDEPENDENT_AMBULATORY_CARE_PROVIDER_SITE_OTHER): Payer: Medicare HMO | Admitting: Internal Medicine

## 2017-10-12 ENCOUNTER — Encounter: Payer: Self-pay | Admitting: Internal Medicine

## 2017-10-12 VITALS — BP 126/72 | HR 80 | Ht 66.0 in | Wt 178.0 lb

## 2017-10-12 DIAGNOSIS — R200 Anesthesia of skin: Secondary | ICD-10-CM | POA: Diagnosis not present

## 2017-10-12 DIAGNOSIS — N3000 Acute cystitis without hematuria: Secondary | ICD-10-CM

## 2017-10-12 DIAGNOSIS — M25469 Effusion, unspecified knee: Secondary | ICD-10-CM

## 2017-10-12 DIAGNOSIS — I10 Essential (primary) hypertension: Secondary | ICD-10-CM

## 2017-10-12 DIAGNOSIS — G8929 Other chronic pain: Secondary | ICD-10-CM

## 2017-10-12 DIAGNOSIS — K219 Gastro-esophageal reflux disease without esophagitis: Secondary | ICD-10-CM | POA: Diagnosis not present

## 2017-10-12 DIAGNOSIS — R1011 Right upper quadrant pain: Secondary | ICD-10-CM | POA: Diagnosis not present

## 2017-10-12 DIAGNOSIS — G4733 Obstructive sleep apnea (adult) (pediatric): Secondary | ICD-10-CM | POA: Diagnosis not present

## 2017-10-12 DIAGNOSIS — M1710 Unilateral primary osteoarthritis, unspecified knee: Secondary | ICD-10-CM | POA: Insufficient documentation

## 2017-10-12 DIAGNOSIS — M179 Osteoarthritis of knee, unspecified: Secondary | ICD-10-CM | POA: Insufficient documentation

## 2017-10-12 DIAGNOSIS — M722 Plantar fascial fibromatosis: Secondary | ICD-10-CM | POA: Insufficient documentation

## 2017-10-12 HISTORY — DX: Effusion, unspecified knee: M25.469

## 2017-10-12 LAB — POC URINALYSIS WITH MICROSCOPIC (NON AUTO)MANUAL RESULT
BILIRUBIN UA: NEGATIVE
Blood, UA: NEGATIVE
Crystals: 0
EPITHELIAL CELLS, URINE PER MICROSCOPY: 0
GLUCOSE UA: NEGATIVE
Ketones, UA: NEGATIVE
Mucus, UA: 0
Nitrite, UA: POSITIVE
Protein, UA: NEGATIVE
RBC: 0 M/uL — AB (ref 4.04–5.48)
Spec Grav, UA: 1.01 (ref 1.010–1.025)
UROBILINOGEN UA: 0.2 U/dL
WBC Casts, UA: 10
pH, UA: 6.5 (ref 5.0–8.0)

## 2017-10-12 NOTE — Progress Notes (Signed)
Date:  10/12/2017   Name:  Joanna Sanders   DOB:  Dec 16, 1929   MRN:  [id number]   Chief Complaint: Hypertension Hypertension  This is a chronic problem. The problem is unchanged. The problem is controlled. Pertinent negatives include no chest pain, palpitations or shortness of breath.  Urinary Tract Infection   This is a recurrent (a few days duration every few weeks) problem. The problem has been waxing and waning. The quality of the pain is described as burning. The pain is mild. There has been no fever. Pertinent negatives include no chills, flank pain, hematuria, nausea or urgency.  Gastroesophageal Reflux  She complains of abdominal pain (intermittent RUQ pain with diarrhea). She reports no chest pain or no nausea. She has tried a PPI for the symptoms.  Varicose vein in right thigh - tender area but not hot and not changing.  No injury, she is not interested in any intervention. RUQ pain -still occurs at times, associated with episodes of diarrhea.  Diarrhea is in general much improved.  Uses questran and imodium as needed.  She had another CT scan of the abdomen done in February - it showed a stable cystic structure of the pancreas.     Review of Systems  Constitutional: Negative for chills.  Respiratory: Negative for chest tightness and shortness of breath.   Cardiovascular: Negative for chest pain, palpitations and leg swelling.  Gastrointestinal: Positive for abdominal pain (intermittent RUQ pain with diarrhea) and diarrhea. Negative for nausea.  Genitourinary: Positive for dysuria. Negative for flank pain, hematuria, urgency and vaginal discharge.  Musculoskeletal: Positive for arthralgias (right knee) and myalgias. Negative for joint swelling.  Neurological: Positive for numbness (along right side of thigh).  Psychiatric/Behavioral: Negative for sleep disturbance. The patient is not nervous/anxious.     Patient Active Problem List   Diagnosis Date Noted  . Plantar  fascial fibromatosis 10/12/2017  . Osteoarthritis of knee 10/12/2017  . Knee joint effusion 10/12/2017  . OSA (obstructive sleep apnea) 09/18/2016  . Chronic left shoulder pain 06/18/2016  . Abdominal pain, chronic, right upper quadrant 04/29/2016  . 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 Age-related osteoporosis without current pathological fracture 04/06/2016  . Allergic rhinitis 01/24/2015  . Benign essential HTN 01/24/2015  . HLD (hyperlipidemia) 01/24/2015  . Acid reflux 01/24/2015  . Generalized OA 01/24/2015  . Hyperparathyroidism, primary ([place]) 01/24/2015  . Avitaminosis D 01/24/2015    Prior to Admission medications   Medication Sig Start Date End Date Taking? Authorizing Provider  alendronate (FOSAMAX) 70 MG tablet Take 1 tablet (70 mg total) by mouth once a week. 04/13/17  Yes Glean Hess, MD  cholecalciferol (VITAMIN D) 1000 UNITS tablet Take 1 tablet by mouth daily.   Yes [provider]  cholestyramine (QUESTRAN) 4 g packet Take 1 packet (4 g total) by mouth 2 (two) times daily. 01/27/17  Yes Glean Hess, MD  esomeprazole (NEXIUM) 40 MG capsule Take 40 mg by mouth daily at 12 noon.   Yes [provider]  furosemide (LASIX) 20 MG tablet Take 1 tablet (20 mg total) by mouth 2 (two) times daily. 04/13/17  Yes Glean Hess, MD  Loperamide HCl (IMODIUM A-D PO) Take by mouth.   Yes [provider]  meclizine (ANTIVERT) 12.5 MG tablet Take 1 tablet (12.5 mg total) by mouth 3 (three) times daily as needed for dizziness. 04/06/16  Yes Glean Hess, MD  ondansetron (ZOFRAN) 4 MG tablet Take 1 tablet (4 mg total)  by mouth every 8 (eight) hours as needed for nausea or vomiting. 04/06/16  Yes Glean Hess, MD    Allergies  Allergen Reactions  . Nitrofuran Derivatives Diarrhea  . Hydrochlorothiazide     hypercalcemia  . Prednisone Other (See Comments)    Gets really hyper    Past Surgical History:  Procedure Laterality Date  . CARPAL  TUNNEL RELEASE Right   . CATARACT EXTRACTION    . CHOLECYSTECTOMY  2013  . TOTAL SHOULDER REPLACEMENT Left 2013    Social History   Tobacco Use  . Smoking status: Former Smoker    Last attempt to quit: 07/06/1996    Years since quitting: 21.2  . Smokeless tobacco: Never Used  Substance Use Topics  . Alcohol use: Not on file  . Drug use: No     Medication list has been reviewed and updated.  PHQ 2/9 Scores 02/22/2017 04/06/2016 02/12/2016 01/25/2015  PHQ - 2 Score 0 0 0 0    Physical Exam  Constitutional: She is oriented to person, place, and time. She appears well-developed. No distress.  HENT:  Head: Normocephalic and atraumatic.  Cardiovascular: Normal rate, regular rhythm and normal heart sounds.  Pulmonary/Chest: Effort normal and breath sounds normal. No respiratory distress. She has no wheezes.  Abdominal: Soft. Bowel sounds are normal. There is tenderness in the suprapubic area.  Musculoskeletal: Normal range of motion.  Neurological: She is alert and oriented to person, place, and time.  Skin: Skin is warm and dry. No rash noted.  Psychiatric: She has a normal mood and affect. Her behavior is normal. Thought content normal.  Nursing note and vitals reviewed.   BP 126/72   Pulse 80   Ht 5\' 6"  (1.676 m)   Wt 178 lb (80.7 kg)   SpO2 99%   BMI 28.73 kg/m   Assessment and Plan: 1. Benign essential HTN controlled  2. Gastroesophageal reflux disease, esophagitis presence not specified Continue PPI  3. OSA (obstructive sleep apnea) Doing well on CPAP  4. Acute cystitis without hematuria Will await culture before treating - POC urinalysis w microscopic (non auto) - Urine Culture  5. Thigh numbness Uncertain cause - may be from lower back or other nerve entrapment  6. Abdominal pain, chronic, right upper quadrant Pt reassured; sx likely related to colonic spasm Recent CT reassuring   No orders of the defined types were placed in this  encounter.   Partially dictated using Editor, commissioning. Any errors are unintentional.  Halina Maidens, MD [facility]  10/12/2017

## 2017-10-18 ENCOUNTER — Other Ambulatory Visit: Payer: Self-pay | Admitting: Internal Medicine

## 2017-10-18 ENCOUNTER — Telehealth: Payer: Self-pay | Admitting: Internal Medicine

## 2017-10-18 DIAGNOSIS — N3000 Acute cystitis without hematuria: Secondary | ICD-10-CM

## 2017-10-18 LAB — URINE CULTURE

## 2017-10-18 MED ORDER — CEPHALEXIN 500 MG PO CAPS: 500.0000 mg | ORAL_CAPSULE | Freq: Four times a day (QID) | ORAL | 0 refills | Status: AC

## 2017-10-18 NOTE — Telephone Encounter (Signed)
Patient informed of positive culture and medication sent to pharmacy.

## 2017-10-18 NOTE — Telephone Encounter (Signed)
Please let the patient know that her culture was positive for E Coli. I have sent in a prescription for Keflex to her pharmacy.

## 2017-10-20 DIAGNOSIS — R69 Illness, unspecified: Secondary | ICD-10-CM | POA: Diagnosis not present

## 2017-10-24 DIAGNOSIS — G4733 Obstructive sleep apnea (adult) (pediatric): Secondary | ICD-10-CM | POA: Diagnosis not present

## 2017-11-03 ENCOUNTER — Ambulatory Visit (INDEPENDENT_AMBULATORY_CARE_PROVIDER_SITE_OTHER): Payer: Medicare HMO | Admitting: Internal Medicine

## 2017-11-03 ENCOUNTER — Encounter: Payer: Self-pay | Admitting: Internal Medicine

## 2017-11-03 VITALS — BP 140/78 | HR 88 | Temp 98.8°F | Resp 16 | Ht 66.0 in | Wt 178.4 lb

## 2017-11-03 DIAGNOSIS — J019 Acute sinusitis, unspecified: Secondary | ICD-10-CM

## 2017-11-03 DIAGNOSIS — N3000 Acute cystitis without hematuria: Secondary | ICD-10-CM

## 2017-11-03 MED ORDER — AMOXICILLIN-POT CLAVULANATE 875-125 MG PO TABS: 1.0000 | ORAL_TABLET | Freq: Two times a day (BID) | ORAL | 0 refills | Status: AC

## 2017-11-03 MED ORDER — GUAIFENESIN-CODEINE 100-10 MG/5ML PO SYRP
5.0000 mL | ORAL_SOLUTION | Freq: Every evening | ORAL | 0 refills | Status: AC | PRN
Start: 2017-11-03 — End: 2017-11-17

## 2017-11-03 MED ORDER — FLUTICASONE PROPIONATE 50 MCG/ACT NA SUSP: 2.0000 | Freq: Every day | NASAL | 6 refills | Status: DC

## 2017-11-03 NOTE — Patient Instructions (Signed)
Start probiotics along with antibiotics.

## 2017-11-03 NOTE — Progress Notes (Signed)
Date:  11/03/2017   Name:  Joanna Sanders   DOB:  [birth year]   MRN:  [id number]   Chief Complaint: URI (coughx 5 days ) and Urinary Tract Infection (still symptomatic ) Urinary Tract Infection   This is a chronic problem. The problem has been gradually improving (but still sx). The quality of the pain is described as burning. The pain is mild. There has been no fever. Associated symptoms include frequency and urgency. Pertinent negatives include no chills. Treatments tried: treated with Keflex for E coli 2 weeks ago. E Coli resistant to quinolone, septra and TCN  URI   This is a new problem. The current episode started in the past 7 days. The problem has been gradually worsening. There has been no fever. Associated symptoms include congestion, coughing and a sore throat. Pertinent negatives include no abdominal pain, chest pain, ear pain, headaches, plugged ear sensation or wheezing. She has tried nothing for the symptoms.      Review of Systems  Constitutional: Positive for fatigue and fever. Negative for chills.  HENT: Positive for congestion, postnasal drip and sore throat. Negative for ear pain.   Respiratory: Positive for cough. Negative for chest tightness, shortness of breath and wheezing.   Cardiovascular: Negative for chest pain and palpitations.  Gastrointestinal: Negative for abdominal pain.  Genitourinary: Positive for frequency and urgency.  Neurological: Negative for dizziness and headaches.    Patient Active Problem List   Diagnosis Date Noted  . Plantar fascial fibromatosis 10/12/2017  . Osteoarthritis of knee 10/12/2017  . Knee joint effusion 10/12/2017  . Thigh numbness 10/12/2017  . OSA (obstructive sleep apnea) 09/18/2016  . Chronic left shoulder pain 06/18/2016  . Abdominal pain, chronic, right upper quadrant 04/29/2016  . 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 Age-related osteoporosis without current pathological fracture 04/06/2016  . Allergic rhinitis  01/24/2015  . Benign essential HTN 01/24/2015  . HLD (hyperlipidemia) 01/24/2015  . Acid reflux 01/24/2015  . Generalized OA 01/24/2015  . Hyperparathyroidism, primary ([place]) 01/24/2015  . Avitaminosis D 01/24/2015    Prior to Admission medications   Medication Sig Start Date End Date Taking? Authorizing Provider  alendronate (FOSAMAX) 70 MG tablet Take 1 tablet (70 mg total) by mouth once a week. 04/13/17   Glean Hess, MD  cholecalciferol (VITAMIN D) 1000 UNITS tablet Take 1 tablet by mouth daily.    [provider]  cholestyramine (QUESTRAN) 4 g packet Take 1 packet (4 g total) by mouth 2 (two) times daily. 01/27/17   Glean Hess, MD  esomeprazole (NEXIUM) 40 MG capsule Take 40 mg by mouth daily at 12 noon.    [provider]  furosemide (LASIX) 20 MG tablet Take 1 tablet (20 mg total) by mouth 2 (two) times daily. 04/13/17   Glean Hess, MD  Loperamide HCl (IMODIUM A-D PO) Take by mouth.    [provider]  meclizine (ANTIVERT) 12.5 MG tablet Take 1 tablet (12.5 mg total) by mouth 3 (three) times daily as needed for dizziness. 04/06/16   Glean Hess, MD  ondansetron (ZOFRAN) 4 MG tablet Take 1 tablet (4 mg total) by mouth every 8 (eight) hours as needed for nausea or vomiting. 04/06/16   Glean Hess, MD    Allergies  Allergen Reactions  . Nitrofuran Derivatives Diarrhea  . Hydrochlorothiazide     hypercalcemia  . Prednisone Other (See Comments)    Gets really hyper    Past Surgical History:  Procedure Laterality  Date  . CARPAL TUNNEL RELEASE Right   . CATARACT EXTRACTION    . CHOLECYSTECTOMY  2013  . TOTAL SHOULDER REPLACEMENT Left 2013    Social History   Tobacco Use  . Smoking status: Former Smoker    Last attempt to quit: 07/06/1996    Years since quitting: 21.3  . Smokeless tobacco: Never Used  Substance Use Topics  . Alcohol use: Not on file  . Drug use: No     Medication list has been reviewed and  updated.  PHQ 2/9 Scores 02/22/2017 04/06/2016 02/12/2016 01/25/2015  PHQ - 2 Score 0 0 0 0    Physical Exam  Constitutional: She is oriented to person, place, and time. She appears well-developed and well-nourished.  HENT:  Right Ear: External ear and ear canal normal. Tympanic membrane is not erythematous and not retracted.  Left Ear: External ear and ear canal normal. Tympanic membrane is not erythematous and not retracted.  Nose: Right sinus exhibits maxillary sinus tenderness and frontal sinus tenderness. Left sinus exhibits maxillary sinus tenderness and frontal sinus tenderness.  Mouth/Throat: Uvula is midline and mucous membranes are normal. No oral lesions. Posterior oropharyngeal erythema present. No oropharyngeal exudate.  Cardiovascular: Normal rate, regular rhythm and normal heart sounds.  Pulmonary/Chest: Breath sounds normal. She has no wheezes. She has no rales.  Abdominal: Soft. There is tenderness in the suprapubic area. There is no rigidity and no guarding.  Lymphadenopathy:    She has no cervical adenopathy.  Neurological: She is alert and oriented to person, place, and time.    BP 140/78   Pulse 88   Temp 98.8 F (37.1 C) (Oral)   Resp 16   Ht 5\' 6"  (1.676 m)   Wt 178 lb 6.4 oz (80.9 kg)   SpO2 98%   BMI 28.79 kg/m   Assessment and Plan: 1. Acute non-recurrent sinusitis, unspecified location - amoxicillin-clavulanate (AUGMENTIN) 875-125 MG tablet; Take 1 tablet by mouth 2 (two) times daily for 10 days.  Dispense: 20 tablet; Refill: 0 - fluticasone (FLONASE) 50 MCG/ACT nasal spray; Place 2 sprays into both nostrils daily.  Dispense: 16 g; Refill: 6 - guaiFENesin-codeine (ROBITUSSIN AC) 100-10 MG/5ML syrup; Take 5 mLs by mouth at bedtime as needed for up to 14 days for cough.  Dispense: 118 mL; Refill: 0  2. Acute cystitis without hematuria Should respond to augmentin (+sens on last cx)   Meds ordered this encounter  Medications  . amoxicillin-clavulanate  (AUGMENTIN) 875-125 MG tablet    Sig: Take 1 tablet by mouth 2 (two) times daily for 10 days.    Dispense:  20 tablet    Refill:  0  . fluticasone (FLONASE) 50 MCG/ACT nasal spray    Sig: Place 2 sprays into both nostrils daily.    Dispense:  16 g    Refill:  6  . guaiFENesin-codeine (ROBITUSSIN AC) 100-10 MG/5ML syrup    Sig: Take 5 mLs by mouth at bedtime as needed for up to 14 days for cough.    Dispense:  118 mL    Refill:  0    Partially dictated using Editor, commissioning. Any errors are unintentional.  Halina Maidens, MD [facility]  11/03/2017

## 2017-11-16 DIAGNOSIS — R69 Illness, unspecified: Secondary | ICD-10-CM | POA: Diagnosis not present

## 2017-11-23 DIAGNOSIS — G4733 Obstructive sleep apnea (adult) (pediatric): Secondary | ICD-10-CM | POA: Diagnosis not present

## 2017-12-03 ENCOUNTER — Telehealth: Payer: Self-pay | Admitting: Internal Medicine

## 2017-12-03 NOTE — Telephone Encounter (Signed)
Called to schedule Medicare Annual Wellness Visit with Nurse Health Advisor. If patient returns call, please note: their last AWV was on 04/06/16 please schedule AWV with NHA any date ~~preferably week before CPE in oct  Thank you! For any questions please contact: Jill Alexanders [phone]  Skype [email or web address]

## 2017-12-24 DIAGNOSIS — G4733 Obstructive sleep apnea (adult) (pediatric): Secondary | ICD-10-CM | POA: Diagnosis not present

## 2018-01-23 DIAGNOSIS — G4733 Obstructive sleep apnea (adult) (pediatric): Secondary | ICD-10-CM | POA: Diagnosis not present

## 2018-02-08 DIAGNOSIS — Z87891 Personal history of nicotine dependence: Secondary | ICD-10-CM | POA: Diagnosis not present

## 2018-02-08 DIAGNOSIS — R42 Dizziness and giddiness: Secondary | ICD-10-CM | POA: Diagnosis not present

## 2018-02-08 DIAGNOSIS — G8929 Other chronic pain: Secondary | ICD-10-CM | POA: Diagnosis not present

## 2018-02-08 DIAGNOSIS — Z8249 Family history of ischemic heart disease and other diseases of the circulatory system: Secondary | ICD-10-CM | POA: Diagnosis not present

## 2018-02-08 DIAGNOSIS — M1990 Unspecified osteoarthritis, unspecified site: Secondary | ICD-10-CM | POA: Diagnosis not present

## 2018-02-08 DIAGNOSIS — R110 Nausea: Secondary | ICD-10-CM | POA: Diagnosis not present

## 2018-02-08 DIAGNOSIS — K219 Gastro-esophageal reflux disease without esophagitis: Secondary | ICD-10-CM | POA: Diagnosis not present

## 2018-02-08 DIAGNOSIS — R609 Edema, unspecified: Secondary | ICD-10-CM | POA: Diagnosis not present

## 2018-02-23 DIAGNOSIS — G4733 Obstructive sleep apnea (adult) (pediatric): Secondary | ICD-10-CM | POA: Diagnosis not present

## 2018-03-16 DIAGNOSIS — R69 Illness, unspecified: Secondary | ICD-10-CM | POA: Diagnosis not present

## 2018-03-23 ENCOUNTER — Telehealth: Payer: Self-pay | Admitting: Internal Medicine

## 2018-03-23 NOTE — Telephone Encounter (Signed)
Called to schedule Medicare Annual Wellness Visit with the Nurse Health Advisor.  °Thank you! °For any questions please contact: Kathryn Brown [phone] or Skype at: [email or web address]  ° ° °

## 2018-04-11 ENCOUNTER — Ambulatory Visit: Payer: Self-pay

## 2018-04-12 ENCOUNTER — Encounter: Payer: Self-pay | Admitting: Internal Medicine

## 2018-04-12 ENCOUNTER — Ambulatory Visit (INDEPENDENT_AMBULATORY_CARE_PROVIDER_SITE_OTHER): Payer: Medicare HMO | Admitting: Internal Medicine

## 2018-04-12 ENCOUNTER — Other Ambulatory Visit: Payer: Self-pay | Admitting: Internal Medicine

## 2018-04-12 VITALS — BP 132/70 | HR 91 | Ht 66.0 in | Wt 180.0 lb

## 2018-04-12 DIAGNOSIS — E210 Primary hyperparathyroidism: Secondary | ICD-10-CM | POA: Diagnosis not present

## 2018-04-12 DIAGNOSIS — K219 Gastro-esophageal reflux disease without esophagitis: Secondary | ICD-10-CM

## 2018-04-12 DIAGNOSIS — Z1231 Encounter for screening mammogram for malignant neoplasm of breast: Secondary | ICD-10-CM

## 2018-04-12 DIAGNOSIS — M25551 Pain in right hip: Secondary | ICD-10-CM

## 2018-04-12 DIAGNOSIS — N3000 Acute cystitis without hematuria: Secondary | ICD-10-CM | POA: Diagnosis not present

## 2018-04-12 DIAGNOSIS — E559 Vitamin D deficiency, unspecified: Secondary | ICD-10-CM | POA: Diagnosis not present

## 2018-04-12 DIAGNOSIS — M810 Age-related osteoporosis without current pathological fracture: Secondary | ICD-10-CM

## 2018-04-12 DIAGNOSIS — I10 Essential (primary) hypertension: Secondary | ICD-10-CM

## 2018-04-12 DIAGNOSIS — E782 Mixed hyperlipidemia: Secondary | ICD-10-CM

## 2018-04-12 DIAGNOSIS — Z23 Encounter for immunization: Secondary | ICD-10-CM

## 2018-04-12 DIAGNOSIS — Z0000 Encounter for general adult medical examination without abnormal findings: Secondary | ICD-10-CM | POA: Diagnosis not present

## 2018-04-12 LAB — POCT URINALYSIS DIPSTICK
BILIRUBIN UA: NEGATIVE
Blood, UA: NEGATIVE
Glucose, UA: NEGATIVE
KETONES UA: NEGATIVE
Nitrite, UA: POSITIVE
PH UA: 7 (ref 5.0–8.0)
Protein, UA: NEGATIVE
SPEC GRAV UA: 1.01 (ref 1.010–1.025)
UROBILINOGEN UA: 0.2 U/dL

## 2018-04-12 MED ORDER — MECLIZINE HCL 12.5 MG PO TABS: 12.5000 mg | ORAL_TABLET | Freq: Three times a day (TID) | ORAL | 0 refills | Status: DC | PRN

## 2018-04-12 MED ORDER — ONDANSETRON HCL 4 MG PO TABS: 4.0000 mg | ORAL_TABLET | Freq: Three times a day (TID) | ORAL | 0 refills | Status: DC | PRN

## 2018-04-12 MED ORDER — FUROSEMIDE 20 MG PO TABS: 20.0000 mg | ORAL_TABLET | Freq: Two times a day (BID) | ORAL | 3 refills | Status: DC

## 2018-04-12 MED ORDER — ALENDRONATE SODIUM 70 MG PO TABS: 70.0000 mg | ORAL_TABLET | ORAL | 3 refills | Status: DC

## 2018-04-12 NOTE — Patient Instructions (Signed)
Health Maintenance  Topic Date Due  . INFLUENZA VACCINE  02/03/2018  . MAMMOGRAM  06/07/2018  . COLONOSCOPY  01/14/2019  . TETANUS/TDAP  09/25/2025  . PNA vac Low Risk Adult  Completed  . DEXA SCAN  Addressed

## 2018-04-12 NOTE — Progress Notes (Signed)
Date:  04/12/2018   Name:  Joanna Sanders   DOB:  1929-10-30   MRN:  [id number]   Chief Complaint: Medicare Wellness (Breast Exam. ) and Dizziness (Refills on meclizine, fosamax, and zofran) Joanna Sanders is a 84 y.o. female who presents today for her Complete Annual Exam. She feels fairly well. She reports exercising walking daily. She reports she is sleeping fairly well.   Hypertension  Pertinent negatives include no chest pain, headaches, palpitations or shortness of breath.  Gastroesophageal Reflux  She reports no abdominal pain, no chest pain, no coughing or no wheezing. Associated symptoms include fatigue.  Hyperlipidemia  Pertinent negatives include no chest pain or shortness of breath.  OP - on Alendronate, last DEXA in 02/2017 and was improved.  Hyperparathyroidism - followed by endocrinology.  Stable labs at last visit.  Will continue yearly visits. Dysuria - still gets intermittent UTI sx that last about 3 days, then resolve with increased fluids, etc.   Review of Systems  Constitutional: Positive for fatigue. Negative for chills and fever.  HENT: Negative for congestion, hearing loss, tinnitus, trouble swallowing and voice change.   Eyes: Negative for visual disturbance.  Respiratory: Negative for cough, chest tightness, shortness of breath and wheezing.   Cardiovascular: Negative for chest pain, palpitations and leg swelling.  Gastrointestinal: Negative for abdominal pain, constipation, diarrhea and vomiting.  Endocrine: Negative for polydipsia and polyuria.  Genitourinary: Negative for dysuria, frequency, genital sores, vaginal bleeding and vaginal discharge.  Musculoskeletal: Negative for arthralgias, gait problem and joint swelling.  Skin: Negative for color change and rash.  Neurological: Negative for dizziness, tremors, light-headedness and headaches.  Hematological: Negative for adenopathy. Does not bruise/bleed easily.  Psychiatric/Behavioral: Negative  for dysphoric mood and sleep disturbance. The patient is not nervous/anxious.     Patient Active Problem List   Diagnosis Date Noted  . Plantar fascial fibromatosis 10/12/2017  . Osteoarthritis of knee 10/12/2017  . Knee joint effusion 10/12/2017  . Thigh numbness 10/12/2017  . OSA (obstructive sleep apnea) 09/18/2016  . Chronic left shoulder pain 06/18/2016  . Abdominal pain, chronic, right upper quadrant 04/29/2016  . 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 Age-related osteoporosis without current pathological fracture 04/06/2016  . Allergic rhinitis 01/24/2015  . Benign essential HTN 01/24/2015  . Hyperlipidemia, mixed 01/24/2015  . Acid reflux 01/24/2015  . Generalized OA 01/24/2015  . Hyperparathyroidism, primary ([place]) 01/24/2015  . Avitaminosis D 01/24/2015    Allergies  Allergen Reactions  . Nitrofuran Derivatives Diarrhea  . Hydrochlorothiazide     hypercalcemia  . Prednisone Other (See Comments)    Gets really hyper    Past Surgical History:  Procedure Laterality Date  . CARPAL TUNNEL RELEASE Right   . CATARACT EXTRACTION    . CHOLECYSTECTOMY  2013  . TOTAL SHOULDER REPLACEMENT Left 2013    Social History   Tobacco Use  . Smoking status: Former Smoker    Last attempt to quit: 07/06/1996    Years since quitting: 21.7  . Smokeless tobacco: Never Used  . Tobacco comment: smoking cessation materials not required  Substance Use Topics  . Alcohol use: Not on file  . Drug use: No     Medication list has been reviewed and updated.  Current Meds  Medication Sig  . alendronate (FOSAMAX) 70 MG tablet Take 1 tablet (70 mg total) by mouth once a week.  . cholecalciferol (VITAMIN D) 1000 UNITS tablet Take 1 tablet by mouth daily.  . cholestyramine (QUESTRAN) 4 g  packet Take 1 packet (4 g total) by mouth 2 (two) times daily.  Marland Kitchen esomeprazole (NEXIUM) 40 MG capsule Take 40 mg by mouth daily at 12 noon.  . fluticasone (FLONASE) 50 MCG/ACT nasal spray Place 2 sprays into  both nostrils daily.  . furosemide (LASIX) 20 MG tablet Take 1 tablet (20 mg total) by mouth 2 (two) times daily.  . Loperamide HCl (IMODIUM A-D PO) Take by mouth.  . meclizine (ANTIVERT) 12.5 MG tablet Take 1 tablet (12.5 mg total) by mouth 3 (three) times daily as needed for dizziness.  . ondansetron (ZOFRAN) 4 MG tablet Take 1 tablet (4 mg total) by mouth every 8 (eight) hours as needed for nausea or vomiting.    PHQ 2/9 Scores 04/12/2018 02/22/2017 04/06/2016 02/12/2016  PHQ - 2 Score 0 0 0 0    Physical Exam  Constitutional: She is oriented to person, place, and time. She appears well-developed and well-nourished. No distress.  HENT:  Head: Normocephalic and atraumatic.  Right Ear: Tympanic membrane and ear canal normal.  Left Ear: Tympanic membrane and ear canal normal.  Nose: Right sinus exhibits no maxillary sinus tenderness. Left sinus exhibits no maxillary sinus tenderness.  Mouth/Throat: Uvula is midline and oropharynx is clear and moist.  Eyes: Conjunctivae and EOM are normal. Right eye exhibits no discharge. Left eye exhibits no discharge. No scleral icterus.  Neck: Normal range of motion. Carotid bruit is not present. No erythema present. No thyromegaly present.  Cardiovascular: Normal rate, regular rhythm, normal heart sounds and normal pulses.  Pulmonary/Chest: Effort normal. No respiratory distress. She has no wheezes. Right breast exhibits no mass, no nipple discharge, no skin change and no tenderness. Left breast exhibits no mass, no nipple discharge, no skin change and no tenderness.  Abdominal: Soft. Bowel sounds are normal. There is no hepatosplenomegaly. There is no tenderness. There is no CVA tenderness.  Musculoskeletal:       Right hip: She exhibits decreased range of motion and tenderness. She exhibits normal strength and no swelling.       Left hip: She exhibits normal range of motion, normal strength, no tenderness and no bony tenderness.  Lymphadenopathy:    She  has no cervical adenopathy.    She has no axillary adenopathy.  Neurological: She is alert and oriented to person, place, and time. She has normal reflexes. No cranial nerve deficit or sensory deficit.  Skin: Skin is warm, dry and intact. No rash noted.  Psychiatric: She has a normal mood and affect. Her speech is normal and behavior is normal. Thought content normal.  Nursing note and vitals reviewed.   BP 132/70 (BP Location: Right Arm, Patient Position: Sitting, Cuff Size: Normal)   Pulse 91   Ht 5\' 6"  (1.676 m)   Wt 180 lb (81.6 kg)   SpO2 97%   BMI 29.05 kg/m   Assessment and Plan: 1. Annual physical exam Continue healthy diet and exercise - POCT urinalysis dipstick Will send Urine for culture  2. Encounter for screening mammogram for breast cancer - MM 3D SCREEN BREAST BILATERAL; Future  3. Benign essential HTN controlled - CBC with Differential/Platelet - Comprehensive metabolic panel - TSH - EKG 12-Lead - SR @ 70, old inf MI (no change from 2016) - furosemide (LASIX) 20 MG tablet; Take 1 tablet (20 mg total) by mouth 2 (two) times daily.  Dispense: 180 tablet; Refill: 3  4. Gastroesophageal reflux disease, esophagitis presence not specified Stable Continue PPI Continue B12 oral - CBC  with Differential/Platelet  5. Age-related osteoporosis without current pathological fracture Continue medications DEXA next year - Comprehensive metabolic panel - alendronate (FOSAMAX) 70 MG tablet; Take 1 tablet (70 mg total) by mouth once a week.  Dispense: 90 tablet; Refill: 3  6. Hyperlipidemia, mixed - Lipid panel  7. Avitaminosis D - VITAMIN D 25 Hydroxy (Vit-D Deficiency, Fractures)  8. Need for influenza vaccination - Flu vaccine HIGH DOSE PF  9. Pain of right hip joint Recommend heat and topical rubs as needed  10. Hyperparathyroidism, primary ([place]) Followed by endocrinology   Partially dictated using Dragon software. Any errors are unintentional.  Halina Maidens, MD [facility]  04/12/2018

## 2018-04-13 LAB — CBC WITH DIFFERENTIAL/PLATELET
BASOS: 1 %
Basophils Absolute: 0 10*3/uL (ref 0.0–0.2)
EOS (ABSOLUTE): 0.1 10*3/uL (ref 0.0–0.4)
Eos: 2 %
HEMATOCRIT: 36 % (ref 34.0–46.6)
Hemoglobin: 12.2 g/dL (ref 11.1–15.9)
IMMATURE GRANS (ABS): 0 10*3/uL (ref 0.0–0.1)
IMMATURE GRANULOCYTES: 0 %
LYMPHS: 39 %
Lymphocytes Absolute: 2.3 10*3/uL (ref 0.7–3.1)
MCH: 30.7 pg (ref 26.6–33.0)
MCHC: 33.9 g/dL (ref 31.5–35.7)
MCV: 91 fL (ref 79–97)
MONOS ABS: 0.7 10*3/uL (ref 0.1–0.9)
Monocytes: 12 %
NEUTROS ABS: 2.6 10*3/uL (ref 1.4–7.0)
NEUTROS PCT: 46 %
Platelets: 262 10*3/uL (ref 150–450)
RBC: 3.98 x10E6/uL (ref 3.77–5.28)
RDW: 13.1 % (ref 12.3–15.4)
WBC: 5.8 10*3/uL (ref 3.4–10.8)

## 2018-04-13 LAB — COMPREHENSIVE METABOLIC PANEL
A/G RATIO: 1.8 (ref 1.2–2.2)
ALBUMIN: 4.4 g/dL (ref 3.5–4.7)
ALT: 26 IU/L (ref 0–32)
AST: 20 IU/L (ref 0–40)
Alkaline Phosphatase: 109 IU/L (ref 39–117)
BILIRUBIN TOTAL: 0.4 mg/dL (ref 0.0–1.2)
BUN / CREAT RATIO: 14 (ref 12–28)
BUN: 12 mg/dL (ref 8–27)
CALCIUM: 11.4 mg/dL — AB (ref 8.7–10.3)
CHLORIDE: 101 mmol/L (ref 96–106)
CO2: 23 mmol/L (ref 20–29)
Creatinine, Ser: 0.86 mg/dL (ref 0.57–1.00)
GFR, EST AFRICAN AMERICAN: 71 mL/min/{1.73_m2} (ref >59)
GFR, EST NON AFRICAN AMERICAN: 61 mL/min/{1.73_m2} (ref >59)
GLOBULIN, TOTAL: 2.5 g/dL (ref 1.5–4.5)
Glucose: 91 mg/dL (ref 65–99)
POTASSIUM: 4.8 mmol/L (ref 3.5–5.2)
Sodium: 140 mmol/L (ref 134–144)
Total Protein: 6.9 g/dL (ref 6.0–8.5)

## 2018-04-13 LAB — VITAMIN D 25 HYDROXY (VIT D DEFICIENCY, FRACTURES): VIT D 25 HYDROXY: 29.3 ng/mL — AB (ref 30.0–100.0)

## 2018-04-13 LAB — LIPID PANEL
CHOL/HDL RATIO: 4.1 ratio (ref 0.0–4.4)
Cholesterol, Total: 199 mg/dL (ref 100–199)
HDL: 48 mg/dL (ref >39)
LDL Calculated: 113 mg/dL — ABNORMAL HIGH (ref 0–99)
Triglycerides: 192 mg/dL — ABNORMAL HIGH (ref 0–149)
VLDL Cholesterol Cal: 38 mg/dL (ref 5–40)

## 2018-04-13 LAB — TSH: TSH: 1.33 u[IU]/mL (ref 0.450–4.500)

## 2018-04-16 LAB — URINE CULTURE

## 2018-04-18 ENCOUNTER — Other Ambulatory Visit: Payer: Self-pay | Admitting: Internal Medicine

## 2018-04-18 DIAGNOSIS — N3000 Acute cystitis without hematuria: Secondary | ICD-10-CM

## 2018-04-18 MED ORDER — AMOXICILLIN-POT CLAVULANATE 875-125 MG PO TABS: 1.0000 | ORAL_TABLET | Freq: Two times a day (BID) | ORAL | 0 refills | Status: AC

## 2018-04-21 ENCOUNTER — Other Ambulatory Visit: Payer: Self-pay | Admitting: Internal Medicine

## 2018-04-21 ENCOUNTER — Telehealth: Payer: Self-pay

## 2018-04-21 DIAGNOSIS — N3000 Acute cystitis without hematuria: Secondary | ICD-10-CM

## 2018-04-21 MED ORDER — CEFUROXIME AXETIL 250 MG PO TABS: 250.0000 mg | ORAL_TABLET | Freq: Two times a day (BID) | ORAL | 0 refills | Status: AC

## 2018-04-21 NOTE — Telephone Encounter (Signed)
Patient is calling stating that she thinks she may be having allergic reaction to amox/clav that was sent in for UTI. Should she try something different? States she has been having itching on hands, and all over body.   Please Advise.

## 2018-04-21 NOTE — Telephone Encounter (Signed)
She is probably almost done with the course, so just stop the antibiotics.  Take claritin or similar for itching as needed.

## 2018-04-21 NOTE — Telephone Encounter (Signed)
Patient just started the medication yesterday as she was out of town before. Symptoms are still present. Will take Claritin for itching and stop aug/clav. Told her we will send in alternative antibiotics to pharmacy. Added allergy in chart.

## 2018-05-04 DIAGNOSIS — R197 Diarrhea, unspecified: Secondary | ICD-10-CM | POA: Diagnosis not present

## 2018-05-04 DIAGNOSIS — K862 Cyst of pancreas: Secondary | ICD-10-CM | POA: Diagnosis not present

## 2018-05-04 DIAGNOSIS — Z9049 Acquired absence of other specified parts of digestive tract: Secondary | ICD-10-CM | POA: Diagnosis not present

## 2018-05-04 DIAGNOSIS — K219 Gastro-esophageal reflux disease without esophagitis: Secondary | ICD-10-CM | POA: Diagnosis not present

## 2018-05-16 DIAGNOSIS — H532 Diplopia: Secondary | ICD-10-CM | POA: Diagnosis not present

## 2018-05-16 DIAGNOSIS — H52223 Regular astigmatism, bilateral: Secondary | ICD-10-CM | POA: Diagnosis not present

## 2018-05-16 DIAGNOSIS — H524 Presbyopia: Secondary | ICD-10-CM | POA: Diagnosis not present

## 2018-05-16 DIAGNOSIS — H5021 Vertical strabismus, right eye: Secondary | ICD-10-CM | POA: Diagnosis not present

## 2018-05-16 DIAGNOSIS — H5203 Hypermetropia, bilateral: Secondary | ICD-10-CM | POA: Diagnosis not present

## 2018-05-31 DIAGNOSIS — K862 Cyst of pancreas: Secondary | ICD-10-CM | POA: Diagnosis not present

## 2018-06-01 DIAGNOSIS — H519 Unspecified disorder of binocular movement: Secondary | ICD-10-CM | POA: Diagnosis not present

## 2018-06-01 DIAGNOSIS — W1839XD Other fall on same level, subsequent encounter: Secondary | ICD-10-CM | POA: Diagnosis not present

## 2018-06-01 DIAGNOSIS — H5021 Vertical strabismus, right eye: Secondary | ICD-10-CM | POA: Diagnosis not present

## 2018-06-01 DIAGNOSIS — H532 Diplopia: Secondary | ICD-10-CM | POA: Diagnosis not present

## 2018-06-01 DIAGNOSIS — S0285XD Fracture of orbit, unspecified, subsequent encounter for fracture with routine healing: Secondary | ICD-10-CM | POA: Diagnosis not present

## 2018-06-06 ENCOUNTER — Ambulatory Visit (INDEPENDENT_AMBULATORY_CARE_PROVIDER_SITE_OTHER): Payer: Medicare HMO | Admitting: Internal Medicine

## 2018-06-06 ENCOUNTER — Encounter: Payer: Self-pay | Admitting: Internal Medicine

## 2018-06-06 ENCOUNTER — Other Ambulatory Visit: Payer: Self-pay | Admitting: Internal Medicine

## 2018-06-06 VITALS — BP 118/78 | HR 68 | Ht 66.0 in | Wt 180.0 lb

## 2018-06-06 DIAGNOSIS — D490 Neoplasm of unspecified behavior of digestive system: Secondary | ICD-10-CM

## 2018-06-06 DIAGNOSIS — Z9049 Acquired absence of other specified parts of digestive tract: Secondary | ICD-10-CM

## 2018-06-06 DIAGNOSIS — N3000 Acute cystitis without hematuria: Secondary | ICD-10-CM

## 2018-06-06 DIAGNOSIS — R197 Diarrhea, unspecified: Secondary | ICD-10-CM | POA: Insufficient documentation

## 2018-06-06 LAB — POCT URINALYSIS DIPSTICK
BILIRUBIN UA: NEGATIVE
Blood, UA: NEGATIVE
Glucose, UA: NEGATIVE
Ketones, UA: NEGATIVE
Nitrite, UA: POSITIVE
PH UA: 6 (ref 5.0–8.0)
PROTEIN UA: NEGATIVE
Spec Grav, UA: 1.01 (ref 1.010–1.025)
UROBILINOGEN UA: 0.2 U/dL

## 2018-06-06 MED ORDER — CEFUROXIME AXETIL 250 MG PO TABS: ORAL_TABLET | ORAL | 0 refills | Status: DC

## 2018-06-06 NOTE — Progress Notes (Signed)
Date:  06/06/2018   Name:  Joanna Sanders   DOB:  September 18, 1929   MRN:  [id number]   Chief Complaint: Urinary Tract Infection (Urgency, frequency and Burning  X 3 days. Feels like last UTI had gotten better. This is recurrent for patient. )  Urinary Tract Infection   This is a recurrent problem. The current episode started in the past 7 days. The problem occurs every urination. The quality of the pain is described as burning. The pain is mild. There has been no fever. Pertinent negatives include no chills or hematuria.    Review of Systems  Constitutional: Negative for chills, fatigue and fever.  Respiratory: Negative for cough, chest tightness and shortness of breath.   Cardiovascular: Negative for chest pain, palpitations and leg swelling.  Gastrointestinal: Positive for abdominal pain (intermittent - work up at [employer]) and diarrhea.  Genitourinary: Positive for dysuria. Negative for genital sores, hematuria and vaginal pain.  Neurological: Negative for dizziness and headaches.    Patient Active Problem List   Diagnosis Date Noted  . Plantar fascial fibromatosis 10/12/2017  . Osteoarthritis of knee 10/12/2017  . Knee joint effusion 10/12/2017  . Thigh numbness 10/12/2017  . OSA (obstructive sleep apnea) 09/18/2016  . Chronic left shoulder pain 06/18/2016  . Abdominal pain, chronic, right upper quadrant 04/29/2016  . 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 Age-related osteoporosis without current pathological fracture 04/06/2016  . Allergic rhinitis 01/24/2015  . Benign essential HTN 01/24/2015  . Hyperlipidemia, mixed 01/24/2015  . Acid reflux 01/24/2015  . Generalized OA 01/24/2015  . Hyperparathyroidism, primary ([place]) 01/24/2015  . Avitaminosis D 01/24/2015    Allergies  Allergen Reactions  . Amoxicillin-Pot Clavulanate Itching and Rash    Itching, rash on hands and through out body.  . Nitrofuran Derivatives Diarrhea  . Hydrochlorothiazide     hypercalcemia  .  Prednisone Other (See Comments)    Gets really hyper    Past Surgical History:  Procedure Laterality Date  . CARPAL TUNNEL RELEASE Right   . CATARACT EXTRACTION    . CHOLECYSTECTOMY  2013  . TOTAL SHOULDER REPLACEMENT Left 2013    Social History   Tobacco Use  . Smoking status: Former Smoker    Last attempt to quit: 07/06/1996    Years since quitting: 21.9  . Smokeless tobacco: Never Used  . Tobacco comment: smoking cessation materials not required  Substance Use Topics  . Alcohol use: Not on file  . Drug use: No     Medication list has been reviewed and updated.  Current Meds  Medication Sig  . alendronate (FOSAMAX) 70 MG tablet Take 1 tablet (70 mg total) by mouth once a week.  . cholecalciferol (VITAMIN D) 1000 UNITS tablet Take 1 tablet by mouth daily.  . colestipol (COLESTID) 1 g tablet Take 1 g by mouth 2 (two) times daily.  Marland Kitchen esomeprazole (NEXIUM) 40 MG capsule Take 40 mg by mouth daily at 12 noon.  . fluticasone (FLONASE) 50 MCG/ACT nasal spray Place 2 sprays into both nostrils daily.  . furosemide (LASIX) 20 MG tablet Take 1 tablet (20 mg total) by mouth 2 (two) times daily.  . Loperamide HCl (IMODIUM A-D PO) Take by mouth.  . meclizine (ANTIVERT) 12.5 MG tablet Take 1 tablet (12.5 mg total) by mouth 3 (three) times daily as needed for dizziness.  . ondansetron (ZOFRAN) 4 MG tablet Take 1 tablet (4 mg total) by mouth every 8 (eight) hours as needed for nausea or vomiting.  . [  DISCONTINUED] cholestyramine (QUESTRAN) 4 g packet Take 1 packet (4 g total) by mouth 2 (two) times daily.    PHQ 2/9 Scores 04/12/2018 02/22/2017 04/06/2016 02/12/2016  PHQ - 2 Score 0 0 0 0    Physical Exam  Constitutional: She appears well-developed and well-nourished.  Cardiovascular: Normal rate, regular rhythm and normal heart sounds.  Pulmonary/Chest: Effort normal and breath sounds normal. No respiratory distress.  Abdominal: Soft. Bowel sounds are normal. There is tenderness in the  suprapubic area. There is no rebound, no guarding and no CVA tenderness.  Psychiatric: She has a normal mood and affect.  Nursing note and vitals reviewed.   BP 118/78 (BP Location: Right Arm, Patient Position: Sitting, Cuff Size: Normal)   Pulse 68   Ht 5\' 6"  (1.676 m)   Wt 180 lb (81.6 kg)   SpO2 97%   BMI 29.05 kg/m   Assessment and Plan: 1. Acute cystitis without hematuria Will treat with ceftin x 7 days then prophy TIW Last 2 cultures positive for Ecoli with identical sensitivity profiles - cefUROXime (CEFTIN) 250 MG tablet; Take 1 tablet (250 mg total) by mouth 2 (two) times daily with a meal for 7 days, THEN 1 tablet (250 mg total) 3 (three) times a week.  Dispense: 50 tablet; Refill: 0 - POCT urinalysis dipstick - Urine Culture  2. IPMN (intraductal papillary mucinous neoplasm) Being followed by Ulla Potash  3. Postcholecystectomy diarrhea Continue Colestid 1-2 tabs per day   Partially dictated using Editor, commissioning. Any errors are unintentional.  Halina Maidens, MD [facility]  06/06/2018

## 2018-06-08 ENCOUNTER — Ambulatory Visit: Payer: Medicare HMO

## 2018-06-08 LAB — URINE CULTURE

## 2018-06-13 DIAGNOSIS — Z8781 Personal history of (healed) traumatic fracture: Secondary | ICD-10-CM | POA: Diagnosis not present

## 2018-06-13 DIAGNOSIS — M810 Age-related osteoporosis without current pathological fracture: Secondary | ICD-10-CM | POA: Diagnosis not present

## 2018-06-13 DIAGNOSIS — E210 Primary hyperparathyroidism: Secondary | ICD-10-CM | POA: Diagnosis not present

## 2018-06-16 ENCOUNTER — Ambulatory Visit: Payer: Medicare HMO

## 2018-06-21 ENCOUNTER — Ambulatory Visit
Admission: RE | Admit: 2018-06-21 | Discharge: 2018-06-21 | Disposition: A | Discharge status: Home / Self Care | Payer: Medicare HMO | Source: Ambulatory Visit | Attending: Internal Medicine | Admitting: Internal Medicine

## 2018-06-21 DIAGNOSIS — Z1231 Encounter for screening mammogram for malignant neoplasm of breast: Secondary | ICD-10-CM | POA: Insufficient documentation

## 2018-06-22 DIAGNOSIS — R69 Illness, unspecified: Secondary | ICD-10-CM | POA: Diagnosis not present

## 2018-07-04 DIAGNOSIS — G4733 Obstructive sleep apnea (adult) (pediatric): Secondary | ICD-10-CM | POA: Diagnosis not present

## 2018-11-23 DIAGNOSIS — K915 Postcholecystectomy syndrome: Secondary | ICD-10-CM | POA: Diagnosis not present

## 2018-11-26 DIAGNOSIS — M79661 Pain in right lower leg: Secondary | ICD-10-CM | POA: Diagnosis not present

## 2018-11-29 ENCOUNTER — Telehealth: Payer: Self-pay

## 2018-11-29 NOTE — Telephone Encounter (Signed)
Please call patient. She called saying she needs to follow up from [place] for leg pain due to inflammation of the vascular system. She can follow up towards the end of this week if possible.  Please Advise.

## 2018-11-29 NOTE — Telephone Encounter (Signed)
Please call pt to schedule

## 2018-12-02 ENCOUNTER — Encounter: Payer: Self-pay | Admitting: Internal Medicine

## 2018-12-02 ENCOUNTER — Ambulatory Visit (INDEPENDENT_AMBULATORY_CARE_PROVIDER_SITE_OTHER): Payer: Medicare Other | Admitting: Internal Medicine

## 2018-12-02 ENCOUNTER — Other Ambulatory Visit: Payer: Self-pay

## 2018-12-02 VITALS — BP 128/76 | HR 77 | Ht 66.0 in | Wt 178.0 lb

## 2018-12-02 DIAGNOSIS — I808 Phlebitis and thrombophlebitis of other sites: Secondary | ICD-10-CM

## 2018-12-02 DIAGNOSIS — I10 Essential (primary) hypertension: Secondary | ICD-10-CM | POA: Diagnosis not present

## 2018-12-02 DIAGNOSIS — N3000 Acute cystitis without hematuria: Secondary | ICD-10-CM | POA: Diagnosis not present

## 2018-12-02 MED ORDER — CEFUROXIME AXETIL 250 MG PO TABS: ORAL_TABLET | ORAL | 0 refills | Status: AC

## 2018-12-02 MED ORDER — PREDNISONE 10 MG PO TABS: 40.0000 mg | ORAL_TABLET | Freq: Every day | ORAL | 0 refills | Status: AC

## 2018-12-02 MED ORDER — FUROSEMIDE 20 MG PO TABS: 20.0000 mg | ORAL_TABLET | Freq: Two times a day (BID) | ORAL | 3 refills | Status: DC

## 2018-12-02 NOTE — Progress Notes (Signed)
Date:  12/02/2018   Name:  Joanna Sanders   DOB:  [birth year]   MRN:  [id number]   Chief Complaint: Leg Pain (Follow up from UC- Was told it was vascular inflammation. Taking tylenol and ibuprofen every 8 hours. Using heat. Says she is slightly better. )  Dysuria   This is a recurrent problem. The problem occurs every urination. The quality of the pain is described as burning. The pain is mild. There has been no fever. She is not sexually active. Associated symptoms include frequency, hesitancy and urgency. Pertinent negatives include no chills. She has tried increased fluids for the symptoms. The treatment provided no relief.  Leg Pain   There was no injury mechanism. The pain is present in the right leg. The quality of the pain is described as aching and shooting. The pain is moderate. The pain has been fluctuating since onset. She has tried NSAIDs for the symptoms. The treatment provided mild relief.  Hypertension  This is a chronic problem. The problem is controlled. Pertinent negatives include no chest pain, headaches, palpitations or shortness of breath. Past treatments include diuretics. The current treatment provides significant improvement.  Seen at UC over the weekend.  Xrays were normal.  She was told it was likely vascular inflammation.  She is taking Advil alternating with tylenol and using heat.  The discomfort is about 20% improved.  Review of Systems  Constitutional: Negative for chills, fatigue, fever and unexpected weight change.  Respiratory: Negative for cough, chest tightness, shortness of breath and wheezing.   Cardiovascular: Negative for chest pain, palpitations and leg swelling.  Gastrointestinal: Negative for constipation and diarrhea.  Genitourinary: Positive for dysuria, frequency, hesitancy and urgency.  Musculoskeletal: Positive for myalgias.  Neurological: Negative for dizziness and headaches.  Psychiatric/Behavioral: Positive for sleep disturbance (due to  leg discomfort).    Patient Active Problem List   Diagnosis Date Noted  . IPMN (intraductal papillary mucinous neoplasm) 06/06/2018  . Postcholecystectomy diarrhea 06/06/2018  . Plantar fascial fibromatosis 10/12/2017  . Osteoarthritis of knee 10/12/2017  . Thigh numbness 10/12/2017  . OSA (obstructive sleep apnea) 09/18/2016  . Chronic left shoulder pain 06/18/2016  . Abdominal pain, chronic, right upper quadrant 04/29/2016  . 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 Age-related osteoporosis without current pathological fracture 04/06/2016  . Allergic rhinitis 01/24/2015  . Benign essential HTN 01/24/2015  . Hyperlipidemia, mixed 01/24/2015  . Acid reflux 01/24/2015  . Generalized OA 01/24/2015  . Hyperparathyroidism, primary ([place]) 01/24/2015  . Avitaminosis D 01/24/2015    Allergies  Allergen Reactions  . Amoxicillin-Pot Clavulanate Itching and Rash    Itching, rash on hands and through out body.  . Nitrofuran Derivatives Diarrhea  . Hydrochlorothiazide     hypercalcemia  . Prednisone Other (See Comments)    Gets really hyper    Past Surgical History:  Procedure Laterality Date  . CARPAL TUNNEL RELEASE Right   . CATARACT EXTRACTION    . CHOLECYSTECTOMY  2013  . TOTAL SHOULDER REPLACEMENT Left 2013    Social History   Tobacco Use  . Smoking status: Former Smoker    Last attempt to quit: 07/06/1996    Years since quitting: 22.4  . Smokeless tobacco: Never Used  . Tobacco comment: smoking cessation materials not required  Substance Use Topics  . Alcohol use: Not on file  . Drug use: No     Medication list has been reviewed and updated.  Current Meds  Medication Sig  . alendronate (FOSAMAX) 70  MG tablet Take 1 tablet (70 mg total) by mouth once a week.  . cholecalciferol (VITAMIN D) 1000 UNITS tablet Take 1 tablet by mouth daily.  . colestipol (COLESTID) 1 g tablet Take 1 g by mouth 2 (two) times daily.  Marland Kitchen esomeprazole (NEXIUM) 40 MG capsule Take 40 mg by mouth  daily at 12 noon.  . fluticasone (FLONASE) 50 MCG/ACT nasal spray Place 2 sprays into both nostrils daily.  . furosemide (LASIX) 20 MG tablet Take 1 tablet (20 mg total) by mouth 2 (two) times daily.  . Loperamide HCl (IMODIUM A-D PO) Take by mouth.  . meclizine (ANTIVERT) 12.5 MG tablet Take 1 tablet (12.5 mg total) by mouth 3 (three) times daily as needed for dizziness.  . ondansetron (ZOFRAN) 4 MG tablet Take 1 tablet (4 mg total) by mouth every 8 (eight) hours as needed for nausea or vomiting.    PHQ 2/9 Scores 12/02/2018 04/12/2018 02/22/2017 04/06/2016  PHQ - 2 Score 0 0 0 0    BP Readings from Last 3 Encounters:  12/02/18 128/76  06/06/18 118/78  04/12/18 132/70    Physical Exam Vitals signs and nursing note reviewed.  Constitutional:      General: She is not in acute distress.    Appearance: She is well-developed.  HENT:     Head: Normocephalic and atraumatic.  Neck:     Musculoskeletal: Normal range of motion and neck supple.  Cardiovascular:     Rate and Rhythm: Normal rate and regular rhythm.  Pulmonary:     Effort: Pulmonary effort is normal. No respiratory distress.     Breath sounds: Normal breath sounds.  Abdominal:     Tenderness: There is no abdominal tenderness.  Musculoskeletal: Normal range of motion.  Skin:    General: Skin is warm and dry.     Findings: No rash.       Neurological:     Mental Status: She is alert and oriented to person, place, and time.  Psychiatric:        Behavior: Behavior normal.        Thought Content: Thought content normal.     Wt Readings from Last 3 Encounters:  12/02/18 178 lb (80.7 kg)  06/06/18 180 lb (81.6 kg)  04/12/18 180 lb (81.6 kg)    BP 128/76   Pulse 77   Ht 5\' 6"  (1.676 m)   Wt 178 lb (80.7 kg)   SpO2 98%   BMI 28.73 kg/m   Assessment and Plan: 1. Superficial thrombophlebitis of right upper extremity Will do short pulse prednisone to reduce inflammation Continue heat ASA 81 mg daily  -  predniSONE (DELTASONE) 10 MG tablet; Take 4 tablets (40 mg total) by mouth daily with breakfast for 4 days.  Dispense: 16 tablet; Refill: 0  2. Acute cystitis without hematuria Recurrent - requiring suppressive therapy in the past - cefUROXime (CEFTIN) 250 MG tablet; Take 1 tablet (250 mg total) by mouth 2 (two) times daily with a meal for 7 days, THEN 1 tablet (250 mg total) 3 (three) times a week.  Dispense: 50 tablet; Refill: 0  3. Benign essential HTN Controlled. - furosemide (LASIX) 20 MG tablet; Take 1 tablet (20 mg total) by mouth 2 (two) times daily.  Dispense: 180 tablet; Refill: 3   Partially dictated using Editor, commissioning. Any errors are unintentional.  Halina Maidens, MD [facility]  12/02/2018

## 2018-12-06 ENCOUNTER — Telehealth: Payer: Self-pay

## 2018-12-06 ENCOUNTER — Other Ambulatory Visit: Payer: Self-pay | Admitting: Internal Medicine

## 2018-12-06 DIAGNOSIS — I808 Phlebitis and thrombophlebitis of other sites: Secondary | ICD-10-CM

## 2018-12-06 NOTE — Telephone Encounter (Signed)
Did the short course of prednisone help?  I think she may need to see a vascular specialist.  She can also try a light compression sock.  If she needs me to see her again I can, but I am not sure what else I would do.

## 2018-12-06 NOTE — Telephone Encounter (Signed)
Patient called saying she wanted to report a new pain above her knee going towards her groin area. Also her ankle is very painful again with no swelling.  Wants to know should she come back and see you for this?  Thank you.

## 2018-12-06 NOTE — Telephone Encounter (Signed)
Patient said prednisone did help. There is just a few more spots starting to hurt as of yesterday. Lower leg is tight feeling but not swollen. Dr. Tobie Poet at [facility] is where she would like to be seen.

## 2018-12-09 DIAGNOSIS — M79604 Pain in right leg: Secondary | ICD-10-CM | POA: Diagnosis not present

## 2018-12-09 DIAGNOSIS — Z87891 Personal history of nicotine dependence: Secondary | ICD-10-CM | POA: Diagnosis not present

## 2018-12-09 DIAGNOSIS — M79661 Pain in right lower leg: Secondary | ICD-10-CM | POA: Diagnosis not present

## 2018-12-09 DIAGNOSIS — Z79899 Other long term (current) drug therapy: Secondary | ICD-10-CM | POA: Diagnosis not present

## 2018-12-09 DIAGNOSIS — Z87440 Personal history of urinary (tract) infections: Secondary | ICD-10-CM | POA: Diagnosis not present

## 2018-12-09 DIAGNOSIS — Z86718 Personal history of other venous thrombosis and embolism: Secondary | ICD-10-CM | POA: Diagnosis not present

## 2018-12-09 DIAGNOSIS — I10 Essential (primary) hypertension: Secondary | ICD-10-CM | POA: Diagnosis not present

## 2018-12-09 DIAGNOSIS — E213 Hyperparathyroidism, unspecified: Secondary | ICD-10-CM | POA: Diagnosis not present

## 2019-04-05 ENCOUNTER — Ambulatory Visit: Payer: Self-pay

## 2019-04-10 ENCOUNTER — Other Ambulatory Visit: Payer: Self-pay | Admitting: Internal Medicine

## 2019-04-10 ENCOUNTER — Other Ambulatory Visit: Payer: Self-pay

## 2019-04-10 ENCOUNTER — Ambulatory Visit (INDEPENDENT_AMBULATORY_CARE_PROVIDER_SITE_OTHER): Payer: Medicare Other | Admitting: Internal Medicine

## 2019-04-10 ENCOUNTER — Encounter: Payer: Self-pay | Admitting: Internal Medicine

## 2019-04-10 VITALS — BP 112/72 | HR 77 | Temp 98.1°F | Ht 66.0 in | Wt 175.0 lb

## 2019-04-10 DIAGNOSIS — N390 Urinary tract infection, site not specified: Secondary | ICD-10-CM | POA: Diagnosis not present

## 2019-04-10 DIAGNOSIS — Z23 Encounter for immunization: Secondary | ICD-10-CM | POA: Diagnosis not present

## 2019-04-10 DIAGNOSIS — N3001 Acute cystitis with hematuria: Secondary | ICD-10-CM | POA: Diagnosis not present

## 2019-04-10 LAB — POCT URINALYSIS DIPSTICK
Bilirubin, UA: NEGATIVE
Glucose, UA: NEGATIVE
Ketones, UA: NEGATIVE
Nitrite, UA: POSITIVE
Protein, UA: NEGATIVE
Spec Grav, UA: 1.015 (ref 1.010–1.025)
Urobilinogen, UA: 0.2 E.U./dL
pH, UA: 6 (ref 5.0–8.0)

## 2019-04-10 MED ORDER — CEFUROXIME AXETIL 250 MG PO TABS: 250.0000 mg | ORAL_TABLET | Freq: Two times a day (BID) | ORAL | 0 refills | Status: AC

## 2019-04-10 NOTE — Progress Notes (Signed)
Date:  04/10/2019   Name:  Joanna Sanders   DOB:  Nov 24, 1929   MRN:  [id number]   Chief Complaint: Urinary Tract Infection (Urgency, frequency, burning, flank pain- started mid last week. High dose flu shot. )  Urinary Tract Infection  This is a new problem. The current episode started in the past 7 days. The problem occurs every urination. The problem has been unchanged. The quality of the pain is described as burning. The pain is mild. There has been no fever. Associated symptoms include flank pain, frequency, hesitancy and urgency. Pertinent negatives include no chills, nausea or vomiting. She has tried increased fluids for the symptoms.  From May through September she was on three times a week Ceftin for suppression and had no issues.  However, within three weeks of stopping, she began to have pressure, frequency and flank pain that has progressed over the past week.  Review of Systems  Constitutional: Negative for chills, diaphoresis, fatigue and fever.  Respiratory: Negative for cough, chest tightness, shortness of breath and wheezing.   Cardiovascular: Negative for chest pain and palpitations.  Gastrointestinal: Negative for nausea and vomiting.  Genitourinary: Positive for dysuria, flank pain, frequency, hesitancy and urgency.  Neurological: Negative for dizziness and headaches.    Patient Active Problem List   Diagnosis Date Noted  . IPMN (intraductal papillary mucinous neoplasm) 06/06/2018  . Postcholecystectomy diarrhea 06/06/2018  . Plantar fascial fibromatosis 10/12/2017  . Osteoarthritis of knee 10/12/2017  . Thigh numbness 10/12/2017  . OSA (obstructive sleep apnea) 09/18/2016  . Chronic left shoulder pain 06/18/2016  . Abdominal pain, chronic, right upper quadrant 04/29/2016  . 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 Age-related osteoporosis without current pathological fracture 04/06/2016  . Allergic rhinitis 01/24/2015  . Benign essential HTN 01/24/2015  .  Hyperlipidemia, mixed 01/24/2015  . Acid reflux 01/24/2015  . Generalized OA 01/24/2015  . Hyperparathyroidism, primary ([place]) 01/24/2015  . Avitaminosis D 01/24/2015    Allergies  Allergen Reactions  . Amoxicillin-Pot Clavulanate Itching and Rash    Itching, rash on hands and through out body.  . Nitrofuran Derivatives Diarrhea  . Hydrochlorothiazide     hypercalcemia  . Prednisone Other (See Comments)    Gets really hyper    Past Surgical History:  Procedure Laterality Date  . CARPAL TUNNEL RELEASE Right   . CATARACT EXTRACTION    . CHOLECYSTECTOMY  2013  . TOTAL SHOULDER REPLACEMENT Left 2013    Social History   Tobacco Use  . Smoking status: Former Smoker    Quit date: 07/06/1996    Years since quitting: 22.7  . Smokeless tobacco: Never Used  . Tobacco comment: smoking cessation materials not required  Substance Use Topics  . Alcohol use: Not on file  . Drug use: No     Medication list has been reviewed and updated.  Current Meds  Medication Sig  . alendronate (FOSAMAX) 70 MG tablet Take 1 tablet (70 mg total) by mouth once a week.  . cholecalciferol (VITAMIN D) 1000 UNITS tablet Take 1 tablet by mouth daily.  . colestipol (COLESTID) 1 g tablet Take 1 g by mouth 2 (two) times daily. PRN  . esomeprazole (NEXIUM) 40 MG capsule Take 40 mg by mouth daily at 12 noon.  . fluticasone (FLONASE) 50 MCG/ACT nasal spray Place 2 sprays into both nostrils daily.  . furosemide (LASIX) 20 MG tablet Take 1 tablet (20 mg total) by mouth 2 (two) times daily.  . Loperamide HCl (IMODIUM A-D PO)  Take by mouth.  . meclizine (ANTIVERT) 12.5 MG tablet Take 1 tablet (12.5 mg total) by mouth 3 (three) times daily as needed for dizziness.  . ondansetron (ZOFRAN) 4 MG tablet Take 1 tablet (4 mg total) by mouth every 8 (eight) hours as needed for nausea or vomiting.    PHQ 2/9 Scores 04/10/2019 12/02/2018 04/12/2018 02/22/2017  PHQ - 2 Score 0 0 0 0    BP Readings from Last 3 Encounters:   04/10/19 112/72  12/02/18 128/76  06/06/18 118/78    Physical Exam Vitals signs and nursing note reviewed.  Constitutional:      Appearance: Normal appearance. She is well-developed.  Neck:     Musculoskeletal: Normal range of motion.  Cardiovascular:     Rate and Rhythm: Normal rate and regular rhythm.     Pulses: Normal pulses.     Heart sounds: Normal heart sounds.  Pulmonary:     Effort: Pulmonary effort is normal. No respiratory distress.     Breath sounds: Normal breath sounds.  Abdominal:     General: Bowel sounds are normal.     Palpations: Abdomen is soft.     Tenderness: There is abdominal tenderness in the suprapubic area. There is no right CVA tenderness, left CVA tenderness, guarding or rebound.  Musculoskeletal:     Right lower leg: No edema.     Left lower leg: No edema.  Skin:    General: Skin is warm and dry.  Neurological:     General: No focal deficit present.     Mental Status: She is alert.  Psychiatric:        Mood and Affect: Mood normal.        Behavior: Behavior normal.     Wt Readings from Last 3 Encounters:  04/10/19 175 lb (79.4 kg)  12/02/18 178 lb (80.7 kg)  06/06/18 180 lb (81.6 kg)    BP 112/72   Pulse 77   Temp 98.1 F (36.7 C) (Oral)   Ht 5\' 6"  (1.676 m)   Wt 175 lb (79.4 kg)   SpO2 98%   BMI 28.25 kg/m   Assessment and Plan: 1. Acute cystitis with hematuria Will send urine for culture and begin antibiotics today Will consider suppression again if she can not get into Urology in a timely fashion - cefUROXime (CEFTIN) 250 MG tablet; Take 1 tablet (250 mg total) by mouth 2 (two) times daily with a meal for 10 days.  Dispense: 20 tablet; Refill: 0 - Urine Culture - POCT Urinalysis Dipstick  2. Recurrent UTI Recommend Urology evaluation for recurrent UTI - Ambulatory referral to Urology - POCT Urinalysis Dipstick  3. Need for immunization against influenza - Flu Vaccine QUAD High Dose(Fluad)   Partially dictated using  Editor, commissioning. Any errors are unintentional.  Halina Maidens, MD [facility]  04/10/2019

## 2019-04-12 DIAGNOSIS — L720 Epidermal cyst: Secondary | ICD-10-CM | POA: Diagnosis not present

## 2019-04-12 DIAGNOSIS — L578 Other skin changes due to chronic exposure to nonionizing radiation: Secondary | ICD-10-CM | POA: Diagnosis not present

## 2019-04-12 DIAGNOSIS — L814 Other melanin hyperpigmentation: Secondary | ICD-10-CM | POA: Diagnosis not present

## 2019-04-12 DIAGNOSIS — L570 Actinic keratosis: Secondary | ICD-10-CM | POA: Diagnosis not present

## 2019-04-12 DIAGNOSIS — L821 Other seborrheic keratosis: Secondary | ICD-10-CM | POA: Diagnosis not present

## 2019-04-12 LAB — URINE CULTURE

## 2019-04-14 ENCOUNTER — Encounter: Payer: Self-pay | Admitting: Internal Medicine

## 2019-04-17 DIAGNOSIS — N952 Postmenopausal atrophic vaginitis: Secondary | ICD-10-CM | POA: Diagnosis not present

## 2019-04-17 DIAGNOSIS — N390 Urinary tract infection, site not specified: Secondary | ICD-10-CM | POA: Diagnosis not present

## 2019-04-17 DIAGNOSIS — R109 Unspecified abdominal pain: Secondary | ICD-10-CM | POA: Diagnosis not present

## 2019-04-26 ENCOUNTER — Ambulatory Visit (INDEPENDENT_AMBULATORY_CARE_PROVIDER_SITE_OTHER): Payer: Medicare Other

## 2019-04-26 VITALS — Ht 66.0 in | Wt 175.0 lb

## 2019-04-26 DIAGNOSIS — Z0000 Encounter for general adult medical examination without abnormal findings: Secondary | ICD-10-CM | POA: Diagnosis not present

## 2019-04-26 DIAGNOSIS — Z1231 Encounter for screening mammogram for malignant neoplasm of breast: Secondary | ICD-10-CM | POA: Diagnosis not present

## 2019-04-26 NOTE — Patient Instructions (Signed)
Joanna Sanders , Thank you for taking time to come for your Medicare Wellness Visit. I appreciate your ongoing commitment to your health goals. Please review the following plan we discussed and let me know if I can assist you in the future.   Screening recommendations/referrals: Colonoscopy: no longer required Mammogram: done 06/21/18. Please call [phone] to schedule your mammogram.  Bone Density: done 03/04/17. Ordered by [facility] Endocrinology Recommended yearly ophthalmology/optometry visit for glaucoma screening and checkup Recommended yearly dental visit for hygiene and checkup  Vaccinations: Influenza vaccine: done 04/10/19 Pneumococcal vaccine: done 03/20/14 Tdap vaccine: done 09/26/15 Shingles vaccine: Shingrix series completed 09/08/18    Advanced directives: Please bring a copy of your health care power of attorney and living will to the office at your convenience.  Conditions/risks identified: Keep up the great work!  Next appointment: Please follow up in one year for your Medicare Annual Wellness visit.     Preventive Care 81 Years and Older, Female Preventive care refers to lifestyle choices and visits with your health care provider that can promote health and wellness. What does preventive care include?  A yearly physical exam. This is also called an annual well check.  Dental exams once or twice a year.  Routine eye exams. Ask your health care provider how often you should have your eyes checked.  Personal lifestyle choices, including:  Daily care of your teeth and gums.  Regular physical activity.  Eating a healthy diet.  Avoiding tobacco and drug use.  Limiting alcohol use.  Practicing safe sex.  Taking low-dose aspirin every day.  Taking vitamin and mineral supplements as recommended by your health care provider. What happens during an annual well check? The services and screenings done by your health care provider during your annual well check will  depend on your age, overall health, lifestyle risk factors, and family history of disease. Counseling  Your health care provider may ask you questions about your:  Alcohol use.  Tobacco use.  Drug use.  Emotional well-being.  Home and relationship well-being.  Sexual activity.  Eating habits.  History of falls.  Memory and ability to understand (cognition).  Work and work Statistician.  Reproductive health. Screening  You may have the following tests or measurements:  Height, weight, and BMI.  Blood pressure.  Lipid and cholesterol levels. These may be checked every 5 years, or more frequently if you are over 41 years old.  Skin check.  Lung cancer screening. You may have this screening every year starting at age 65 if you have a 30-pack-year history of smoking and currently smoke or have quit within the past 15 years.  Fecal occult blood test (FOBT) of the stool. You may have this test every year starting at age 69.  Flexible sigmoidoscopy or colonoscopy. You may have a sigmoidoscopy every 5 years or a colonoscopy every 10 years starting at age 55.  Hepatitis C blood test.  Hepatitis B blood test.  Sexually transmitted disease (STD) testing.  Diabetes screening. This is done by checking your blood sugar (glucose) after you have not eaten for a while (fasting). You may have this done every 1-3 years.  Bone density scan. This is done to screen for osteoporosis. You may have this done starting at age 57.  Mammogram. This may be done every 1-2 years. Talk to your health care provider about how often you should have regular mammograms. Talk with your health care provider about your test results, treatment options, and if necessary, the need  for more tests. Vaccines  Your health care provider may recommend certain vaccines, such as:  Influenza vaccine. This is recommended every year.  Tetanus, diphtheria, and acellular pertussis (Tdap, Td) vaccine. You may need a  Td booster every 10 years.  Zoster vaccine. You may need this after age 42.  Pneumococcal 13-valent conjugate (PCV13) vaccine. One dose is recommended after age 40.  Pneumococcal polysaccharide (PPSV23) vaccine. One dose is recommended after age 22. Talk to your health care provider about which screenings and vaccines you need and how often you need them. This information is not intended to replace advice given to you by your health care provider. Make sure you discuss any questions you have with your health care provider. Document Released: 07/19/2015 Document Revised: 03/11/2016 Document Reviewed: 04/23/2015 Elsevier Interactive Patient Education  2017 [place] Prevention in the Home Falls can cause injuries. They can happen to people of all ages. There are many things you can do to make your home safe and to help prevent falls. What can I do on the outside of my home?  Regularly fix the edges of walkways and driveways and fix any cracks.  Remove anything that might make you trip as you walk through a door, such as a raised step or threshold.  Trim any bushes or trees on the path to your home.  Use bright outdoor lighting.  Clear any walking paths of anything that might make someone trip, such as rocks or tools.  Regularly check to see if handrails are loose or broken. Make sure that both sides of any steps have handrails.  Any raised decks and porches should have guardrails on the edges.  Have any leaves, snow, or ice cleared regularly.  Use sand or salt on walking paths during winter.  Clean up any spills in your garage right away. This includes oil or grease spills. What can I do in the bathroom?  Use night lights.  Install grab bars by the toilet and in the tub and shower. Do not use towel bars as grab bars.  Use non-skid mats or decals in the tub or shower.  If you need to sit down in the shower, use a plastic, non-slip stool.  Keep the floor dry. Clean  up any water that spills on the floor as soon as it happens.  Remove soap buildup in the tub or shower regularly.  Attach bath mats securely with double-sided non-slip rug tape.  Do not have throw rugs and other things on the floor that can make you trip. What can I do in the bedroom?  Use night lights.  Make sure that you have a light by your bed that is easy to reach.  Do not use any sheets or blankets that are too big for your bed. They should not hang down onto the floor.  Have a firm chair that has side arms. You can use this for support while you get dressed.  Do not have throw rugs and other things on the floor that can make you trip. What can I do in the kitchen?  Clean up any spills right away.  Avoid walking on wet floors.  Keep items that you use a lot in easy-to-reach places.  If you need to reach something above you, use a strong step stool that has a grab bar.  Keep electrical cords out of the way.  Do not use floor polish or wax that makes floors slippery. If you must use wax, use  non-skid floor wax.  Do not have throw rugs and other things on the floor that can make you trip. What can I do with my stairs?  Do not leave any items on the stairs.  Make sure that there are handrails on both sides of the stairs and use them. Fix handrails that are broken or loose. Make sure that handrails are as long as the stairways.  Check any carpeting to make sure that it is firmly attached to the stairs. Fix any carpet that is loose or worn.  Avoid having throw rugs at the top or bottom of the stairs. If you do have throw rugs, attach them to the floor with carpet tape.  Make sure that you have a light switch at the top of the stairs and the bottom of the stairs. If you do not have them, ask someone to add them for you. What else can I do to help prevent falls?  Wear shoes that:  Do not have high heels.  Have rubber bottoms.  Are comfortable and fit you well.  Are  closed at the toe. Do not wear sandals.  If you use a stepladder:  Make sure that it is fully opened. Do not climb a closed stepladder.  Make sure that both sides of the stepladder are locked into place.  Ask someone to hold it for you, if possible.  Clearly mark and make sure that you can see:  Any grab bars or handrails.  First and last steps.  Where the edge of each step is.  Use tools that help you move around (mobility aids) if they are needed. These include:  Canes.  Walkers.  Scooters.  Crutches.  Turn on the lights when you go into a dark area. Replace any light bulbs as soon as they burn out.  Set up your furniture so you have a clear path. Avoid moving your furniture around.  If any of your floors are uneven, fix them.  If there are any pets around you, be aware of where they are.  Review your medicines with your doctor. Some medicines can make you feel dizzy. This can increase your chance of falling. Ask your doctor what other things that you can do to help prevent falls. This information is not intended to replace advice given to you by your health care provider. Make sure you discuss any questions you have with your health care provider. Document Released: 04/18/2009 Document Revised: 11/28/2015 Document Reviewed: 07/27/2014 Elsevier Interactive Patient Education  2017 [employer].

## 2019-04-26 NOTE — Progress Notes (Signed)
Subjective:   Joanna Sanders is a 85 y.o. female who presents for Medicare Annual (Subsequent) preventive examination.  Virtual Visit via Telephone Note  I connected with Joanna Sanders on 04/26/19 at  2:00 PM EDT by telephone and verified that I am speaking with the correct person using two identifiers.  Medicare Annual Wellness visit completed telephonically due to Covid-19 pandemic.  Location: Patient: home Provider: office   I discussed the limitations, risks, security and privacy concerns of performing an evaluation and management service by telephone and the availability of in person appointments. The patient expressed understanding and agreed to proceed.  Some vital signs may be absent or patient reported.   Clemetine Marker, LPN    Review of Systems:   Cardiac Risk Factors include: advanced age (>19men, >50 women)     Objective:     Vitals: Ht 5\' 6"  (1.676 m)   Wt 175 lb (79.4 kg)   BMI 28.25 kg/m   Body mass index is 28.25 kg/m.  Advanced Directives 04/26/2019 02/22/2017 04/04/2015 01/25/2015  Does Patient Have a Medical Advance Directive? Yes Yes Yes Yes  Type of Paramedic of [place];Living will [place];Living will [place];Living will [place];Living will;Out of facility DNR (pink MOST or yellow form)  Copy of [place] in Chart? No - copy requested No - copy requested - No - copy requested    Tobacco Social History   Tobacco Use  Smoking Status Former Smoker  . Quit date: 07/06/1996  . Years since quitting: 22.8  Smokeless Tobacco Never Used  Tobacco Comment   smoking cessation materials not required     Counseling given: Not Answered Comment: smoking cessation materials not required   Clinical Intake:  Pre-visit preparation completed: Yes  Pain : No/denies pain     BMI - recorded: 28.25 Nutritional Status: BMI 25 -29 Overweight  Nutritional Risks: None Diabetes: No  How often do you need to have someone help you when you read instructions, pamphlets, or other written materials from your doctor or pharmacy?: 1 - Never  Interpreter Needed?: No  Information entered by :: Clemetine Marker LPN  Past Medical History:  Diagnosis Date  . GERD (gastroesophageal reflux disease)   . Hyperlipidemia   . Hypertension   . Knee joint effusion 10/12/2017  . Sleep apnea    Past Surgical History:  Procedure Laterality Date  . CARPAL TUNNEL RELEASE Right   . CATARACT EXTRACTION    . CHOLECYSTECTOMY  2013  . TOTAL SHOULDER REPLACEMENT Left 2013   Family History  Problem Relation Age of Onset  . Hypertension Mother   . Leukemia Mother   . Heart failure Father   . Breast cancer Daughter 22   Social History   Socioeconomic History  . Marital status: Widowed    Spouse name: Not on file  . Number of children: Not on file  . Years of education: Not on file  . Highest education level: Not on file  Occupational History  . Not on file  Social Needs  . Financial resource strain: Not hard at all  . Food insecurity    Worry: Never true    Inability: Never true  . Transportation needs    Medical: No    Non-medical: No  Tobacco Use  . Smoking status: Former Smoker    Quit date: 07/06/1996    Years since quitting: 22.8  . Smokeless tobacco: Never Used  .  Tobacco comment: smoking cessation materials not required  Substance and Sexual Activity  . Alcohol use: Not on file  . Drug use: No  . Sexual activity: Not on file  Lifestyle  . Physical activity    Days per week: 3 days    Minutes per session: 20 min  . Stress: Not at all  Relationships  . Social Herbalist on phone: Patient refused    Gets together: Not on file    Attends religious service: Patient refused    Active member of club or organization: Patient refused    Attends meetings of clubs or organizations: Patient refused    Relationship status:  Widowed  Other Topics Concern  . Not on file  Social History Narrative  . Not on file    Outpatient Encounter Medications as of 04/26/2019  Medication Sig  . alendronate (FOSAMAX) 70 MG tablet Take 1 tablet (70 mg total) by mouth once a week.  . cholecalciferol (VITAMIN D) 1000 UNITS tablet Take 1 tablet by mouth daily.  . colestipol (COLESTID) 1 g tablet Take 1 g by mouth 2 (two) times daily. PRN  . esomeprazole (NEXIUM) 40 MG capsule Take 40 mg by mouth daily at 12 noon.  . furosemide (LASIX) 20 MG tablet Take 1 tablet (20 mg total) by mouth 2 (two) times daily.  . Loperamide HCl (IMODIUM A-D PO) Take by mouth.  . meclizine (ANTIVERT) 12.5 MG tablet Take 1 tablet (12.5 mg total) by mouth 3 (three) times daily as needed for dizziness. (Patient not taking: Reported on 04/26/2019)  . ondansetron (ZOFRAN) 4 MG tablet Take 1 tablet (4 mg total) by mouth every 8 (eight) hours as needed for nausea or vomiting. (Patient not taking: Reported on 04/26/2019)  . [DISCONTINUED] fluticasone (FLONASE) 50 MCG/ACT nasal spray Place 2 sprays into both nostrils daily. (Patient not taking: Reported on 04/26/2019)   No facility-administered encounter medications on file as of 04/26/2019.     Activities of Daily Living In your present state of health, do you have any difficulty performing the following activities: 04/26/2019  Hearing? Y  Comment plans to have hearing evaluation  Vision? N  Difficulty concentrating or making decisions? N  Walking or climbing stairs? N  Dressing or bathing? N  Doing errands, shopping? N  Preparing Food and eating ? N  Using the Toilet? N  In the past six months, have you accidently leaked urine? N  Do you have problems with loss of bowel control? N  Managing your Medications? N  Managing your Finances? N  Housekeeping or managing your Housekeeping? N  Some recent data might be hidden    Patient Care Team: Glean Hess, MD as PCP - General (Internal Medicine)  Gale Journey Gwen Her, MD as Referring Physician (Endocrinology) Rosine Beat, PA (Pulmonary Disease) Raynelle Jan., MD as Referring Physician (Gastroenterology)    Assessment:   This is a routine wellness examination for [place].  Exercise Activities and Dietary recommendations Current Exercise Habits: Home exercise routine, Type of exercise: walking, Time (Minutes): 20, Frequency (Times/Week): 3, Weekly Exercise (Minutes/Week): 60, Intensity: Mild  Goals    . Weight (lb) < 165 lb (74.8 kg)     Pt states she would like to lose a few pounds over the next year       Fall Risk Fall Risk  04/26/2019 04/10/2019 12/02/2018 04/12/2018 02/22/2017  Falls in the past year? 0 0 0 No No  Number falls in past yr:  0 0 0 - -  Injury with Fall? 0 0 0 - -  Risk for fall due to : - - - - -  Follow up Falls prevention discussed Falls evaluation completed Falls evaluation completed - -   FALL RISK PREVENTION PERTAINING TO THE HOME:  Any stairs in or around the home? Yes  If so, do they handrails? Yes   Home free of loose throw rugs in walkways, pet beds, electrical cords, etc? Yes  Adequate lighting in your home to reduce risk of falls? Yes   ASSISTIVE DEVICES UTILIZED TO PREVENT FALLS:  Life alert? No  Use of a cane, walker or w/c? No  Grab bars in the bathroom? Yes  Shower chair or bench in shower? Yes  Elevated toilet seat or a handicapped toilet? Yes   DME ORDERS:  DME order needed?  No   TIMED UP AND GO:  Was the test performed? No . Telephonic visit.   Education: Fall risk prevention has been discussed.  Intervention(s) required? No   Depression Screen PHQ 2/9 Scores 04/26/2019 04/10/2019 12/02/2018 04/12/2018  PHQ - 2 Score 0 0 0 0     Cognitive Function     6CIT Screen 04/26/2019 04/12/2018 02/22/2017 04/06/2016  What Year? 0 points 0 points 0 points 0 points  What month? 0 points 0 points 0 points 0 points  What time? 0 points 0 points 0 points 0 points  Count  back from 20 0 points 0 points 0 points 0 points  Months in reverse 0 points 0 points 0 points 0 points  Repeat phrase 0 points 0 points 0 points 0 points  Total Score 0 0 0 0    Immunization History  Administered Date(s) Administered  . Fluad Quad(high Dose 65+) 04/10/2019  . Influenza, High Dose Seasonal PF 04/13/2017, 04/12/2018  . Influenza,inj,Quad PF,6+ Mos 04/06/2016  . Influenza-Unspecified 02/18/2015  . Pneumococcal Conjugate-13 03/20/2014  . Pneumococcal Polysaccharide-23 07/07/2005  . Tdap 09/26/2015  . Zoster 07/08/2007  . Zoster Recombinat (Shingrix) 06/23/2018, 09/08/2018    Qualifies for Shingles Vaccine? Shingrix series completed.   Tdap: Up to date  Flu Vaccine: Up to date  Pneumococcal Vaccine: Up to date   Screening Tests Health Maintenance  Topic Date Due  . COLONOSCOPY  01/14/2019  . MAMMOGRAM  06/22/2019  . TETANUS/TDAP  09/25/2025  . INFLUENZA VACCINE  Completed  . PNA vac Low Risk Adult  Completed  . DEXA SCAN  Addressed    Cancer Screenings:  Colorectal Screening: Completed 11/13/09.  No longer required.   Mammogram: Completed 06/21/18. Repeat every year. Ordered today. Pt provided with contact information and advised to call to schedule appt.   Bone Density: Completed 03/04/17. Results reflect  OSTEOPOROSIS. Repeat every 2 years. Ordered through [facility] Saralyn Pilar Cacchio PAC.  Lung Cancer Screening: (Low Dose CT Chest recommended if Age 24-80 years, 30 pack-year currently smoking OR have quit w/in 15years.) does not qualify.   Additional Screening:  Hepatitis C Screening: no longer required  Vision Screening: Recommended annual ophthalmology exams for early detection of glaucoma and other disorders of the eye. Is the patient up to date with their annual eye exam?  Yes  Who is the provider or what is the name of the office in which the pt attends annual eye exams? Dr. Malcolm Metro  Dental Screening: Recommended annual dental exams  for proper oral hygiene  Community Resource Referral:  CRR required this visit?  NO     Plan:  I have personally reviewed and addressed the Medicare Annual Wellness questionnaire and have noted the following in the patient's chart:  A. Medical and social history B. Use of alcohol, tobacco or illicit drugs  C. Current medications and supplements D. Functional ability and status E.  Nutritional status F.  Physical activity G. Advance directives H. List of other physicians I.  Hospitalizations, surgeries, and ER visits in previous 12 months J.  [place] such as hearing and vision if needed, cognitive and depression L. Referrals and appointments   In addition, I have reviewed and discussed with patient certain preventive protocols, quality metrics, and best practice recommendations. A written personalized care plan for preventive services as well as general preventive health recommendations were provided to patient.   Signed,  Clemetine Marker, LPN Nurse Health Advisor   Nurse Notes: pt doing well and appreciative of visit today

## 2019-04-28 ENCOUNTER — Ambulatory Visit (INDEPENDENT_AMBULATORY_CARE_PROVIDER_SITE_OTHER): Payer: Medicare Other | Admitting: Internal Medicine

## 2019-04-28 ENCOUNTER — Other Ambulatory Visit: Payer: Self-pay

## 2019-04-28 ENCOUNTER — Encounter: Payer: Self-pay | Admitting: Internal Medicine

## 2019-04-28 ENCOUNTER — Telehealth: Payer: Self-pay | Admitting: Internal Medicine

## 2019-04-28 VITALS — BP 132/82 | HR 74 | Resp 16 | Ht 66.0 in | Wt 173.0 lb

## 2019-04-28 DIAGNOSIS — E782 Mixed hyperlipidemia: Secondary | ICD-10-CM | POA: Diagnosis not present

## 2019-04-28 DIAGNOSIS — Z0000 Encounter for general adult medical examination without abnormal findings: Secondary | ICD-10-CM

## 2019-04-28 DIAGNOSIS — R911 Solitary pulmonary nodule: Secondary | ICD-10-CM | POA: Diagnosis not present

## 2019-04-28 DIAGNOSIS — N390 Urinary tract infection, site not specified: Secondary | ICD-10-CM

## 2019-04-28 DIAGNOSIS — E210 Primary hyperparathyroidism: Secondary | ICD-10-CM

## 2019-04-28 DIAGNOSIS — M810 Age-related osteoporosis without current pathological fracture: Secondary | ICD-10-CM | POA: Diagnosis not present

## 2019-04-28 DIAGNOSIS — Z1231 Encounter for screening mammogram for malignant neoplasm of breast: Secondary | ICD-10-CM | POA: Diagnosis not present

## 2019-04-28 DIAGNOSIS — I10 Essential (primary) hypertension: Secondary | ICD-10-CM | POA: Diagnosis not present

## 2019-04-28 DIAGNOSIS — IMO0001 Reserved for inherently not codable concepts without codable children: Secondary | ICD-10-CM

## 2019-04-28 LAB — POCT URINALYSIS DIPSTICK
Bilirubin, UA: NEGATIVE
Blood, UA: NEGATIVE
Glucose, UA: NEGATIVE
Ketones, UA: NEGATIVE
Leukocytes, UA: NEGATIVE
Nitrite, UA: NEGATIVE
Protein, UA: NEGATIVE
Spec Grav, UA: 1.01 (ref 1.010–1.025)
Urobilinogen, UA: 0.2 E.U./dL
pH, UA: 6.5 (ref 5.0–8.0)

## 2019-04-28 NOTE — Telephone Encounter (Signed)
Pt no longer using [facility], ask that we take it off and only use [facility]

## 2019-04-28 NOTE — Progress Notes (Signed)
Date:  04/28/2019   Name:  Joanna Sanders   DOB:  May 08, 1930   MRN:  [id number]   Chief Complaint: Annual Exam (Breast Exam.) Joanna Sanders is a 85 y.o. female who presents today for her Complete Annual Exam. She feels fairly well. She reports exercising rarely. She reports she is sleeping fairly well. She denies breast issues.  Mammogram 06/2018 Colonoscopy  2017 - no more needed routinely Immunizations UTD DEXA  2016  Hypertension This is a chronic problem. The problem is controlled. Pertinent negatives include no chest pain, headaches, palpitations or shortness of breath. Past treatments include diuretics. The current treatment provides significant improvement. There are no compliance problems.  Identifiable causes of hypertension include hyperparathyroidism.  Hyperlipidemia Recent lipid tests were reviewed and are variable. Pertinent negatives include no chest pain or shortness of breath. Current antihyperlipidemic treatment includes diet change.  OP - on Fosamax and vitamin D.  No medication side effects or complaints.  Followed by endocrinology.  Not taking Calcium due to hyperparathyroidism. Lung nodules - noted on abdomen and pelvic CT done by Urology to evaluate recurrent UTI.  UTI is now resolved and the plan is to treat as needed, increase fluids and void often.  She has remote hx of smoking - quit at least 25 yr ago.  She denies chest pain, sob, cough.  Review of Systems  Constitutional: Negative for chills, fatigue and fever.  HENT: Negative for congestion, hearing loss, tinnitus, trouble swallowing and voice change.   Eyes: Negative for visual disturbance.  Respiratory: Negative for cough, chest tightness, shortness of breath and wheezing.   Cardiovascular: Negative for chest pain, palpitations and leg swelling.  Gastrointestinal: Negative for abdominal pain, constipation, diarrhea and vomiting.  Endocrine: Negative for polydipsia and polyuria.  Genitourinary:  Negative for dysuria, frequency, genital sores, vaginal bleeding and vaginal discharge.  Musculoskeletal: Negative for arthralgias, gait problem and joint swelling.  Skin: Negative for color change and rash.  Neurological: Negative for dizziness, tremors, light-headedness and headaches.  Hematological: Negative for adenopathy. Does not bruise/bleed easily.  Psychiatric/Behavioral: Negative for dysphoric mood and sleep disturbance. The patient is not nervous/anxious.     Patient Active Problem List   Diagnosis Date Noted  . IPMN (intraductal papillary mucinous neoplasm) 06/06/2018  . Postcholecystectomy diarrhea 06/06/2018  . Plantar fascial fibromatosis 10/12/2017  . Osteoarthritis of knee 10/12/2017  . Thigh numbness 10/12/2017  . OSA (obstructive sleep apnea) 09/18/2016  . Chronic left shoulder pain 06/18/2016  . Abdominal pain, chronic, right upper quadrant 04/29/2016  . 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 Age-related osteoporosis without current pathological fracture 04/06/2016  . Allergic rhinitis 01/24/2015  . Benign essential HTN 01/24/2015  . Hyperlipidemia, mixed 01/24/2015  . Acid reflux 01/24/2015  . Generalized OA 01/24/2015  . Hyperparathyroidism, primary ([place]) 01/24/2015  . Avitaminosis D 01/24/2015    Allergies  Allergen Reactions  . Amoxicillin-Pot Clavulanate Itching and Rash    Itching, rash on hands and through out body.  . Nitrofuran Derivatives Diarrhea  . Hydrochlorothiazide     hypercalcemia  . Prednisone Other (See Comments)    Gets really hyper    Past Surgical History:  Procedure Laterality Date  . CARPAL TUNNEL RELEASE Right   . CATARACT EXTRACTION    . CHOLECYSTECTOMY  2013  . TOTAL SHOULDER REPLACEMENT Left 2013    Social History   Tobacco Use  . Smoking status: Former Smoker    Quit date: 07/06/1996    Years since quitting: 22.8  .  Smokeless tobacco: Never Used  . Tobacco comment: smoking cessation materials not required  Substance Use  Topics  . Alcohol use: Not on file  . Drug use: No     Medication list has been reviewed and updated.  Current Meds  Medication Sig  . alendronate (FOSAMAX) 70 MG tablet Take 1 tablet (70 mg total) by mouth once a week.  . cholecalciferol (VITAMIN D) 1000 UNITS tablet Take 1 tablet by mouth daily.  . colestipol (COLESTID) 1 g tablet Take 1 g by mouth 2 (two) times daily. PRN  . esomeprazole (NEXIUM) 40 MG capsule Take 40 mg by mouth daily at 12 noon.  . furosemide (LASIX) 20 MG tablet Take 1 tablet (20 mg total) by mouth 2 (two) times daily.  . Loperamide HCl (IMODIUM A-D PO) Take by mouth.  . meclizine (ANTIVERT) 12.5 MG tablet Take 1 tablet (12.5 mg total) by mouth 3 (three) times daily as needed for dizziness.  . ondansetron (ZOFRAN) 4 MG tablet Take 1 tablet (4 mg total) by mouth every 8 (eight) hours as needed for nausea or vomiting.    PHQ 2/9 Scores 04/28/2019 04/26/2019 04/10/2019 12/02/2018  PHQ - 2 Score 0 0 0 0    BP Readings from Last 3 Encounters:  04/28/19 132/82  04/10/19 112/72  12/02/18 128/76    Physical Exam Vitals signs and nursing note reviewed.  Constitutional:      General: She is not in acute distress.    Appearance: She is well-developed.  HENT:     Head: Normocephalic and atraumatic.     Right Ear: Tympanic membrane and ear canal normal.     Left Ear: Tympanic membrane and ear canal normal.     Nose:     Right Sinus: No maxillary sinus tenderness.     Left Sinus: No maxillary sinus tenderness.  Eyes:     General: No scleral icterus.       Right eye: No discharge.        Left eye: No discharge.     Conjunctiva/sclera: Conjunctivae normal.  Neck:     Musculoskeletal: Normal range of motion. No erythema.     Thyroid: No thyromegaly.     Vascular: No carotid bruit.  Cardiovascular:     Rate and Rhythm: Normal rate and regular rhythm.     Pulses: Normal pulses.     Heart sounds: Normal heart sounds.  Pulmonary:     Effort: Pulmonary effort is  normal. No respiratory distress.     Breath sounds: No wheezing.  Chest:     Breasts:        Right: No mass, nipple discharge, skin change or tenderness.        Left: No mass, nipple discharge, skin change or tenderness.  Abdominal:     General: Bowel sounds are normal.     Palpations: Abdomen is soft.     Tenderness: There is no abdominal tenderness.  Musculoskeletal: Normal range of motion.  Lymphadenopathy:     Cervical: No cervical adenopathy.  Skin:    General: Skin is warm and dry.     Findings: No rash.  Neurological:     Mental Status: She is alert and oriented to person, place, and time.     Cranial Nerves: No cranial nerve deficit.     Sensory: No sensory deficit.     Deep Tendon Reflexes: Reflexes are normal and symmetric.  Psychiatric:        Speech: Speech normal.  Behavior: Behavior normal.        Thought Content: Thought content normal.     Wt Readings from Last 3 Encounters:  04/28/19 173 lb (78.5 kg)  04/26/19 175 lb (79.4 kg)  04/10/19 175 lb (79.4 kg)    BP 132/82   Pulse 74   Resp 16   Ht 5\' 6"  (1.676 m)   Wt 173 lb (78.5 kg)   SpO2 98%   BMI 27.92 kg/m   Assessment and Plan: 1. Annual physical exam Normal exam Continue to increase fluids, call if UTI recurrent - POCT urinalysis dipstick  2. Encounter for screening mammogram for breast cancer To be scheduled   3. Hyperlipidemia, mixed Not on statin medication due to age  Will check labs - Comprehensive metabolic panel - Lipid panel  4. Age-related osteoporosis without current pathological fracture Followed by endocrinology Tolerating Fosamax weekly along with vitamin D  5. Benign essential HTN Clinically stable exam with well controlled BP.   Tolerating medications, lasix 20 mg, without side effects at this time. Pt to continue current regimen and low sodium diet; benefits of regular exercise as able discussed. - CBC with Differential/Platelet - TSH  6.  Hyperparathyroidism, primary ([place]) Stable elevated calcium - no intervention needed at this time Continue to follow up with Endocrinology  7. Lung nodule < 6cm on CT Will get full chest CT to further evaluate - CT CHEST NODULE FOLLOW UP LOW DOSE W/O; Future  8. Recurrent UTI Urology evaluation done - CT showed no urological cause She will continue to increase fluids, void often Call for antibiotics   Partially dictated using Dragon software. Any errors are unintentional.  Halina Maidens, MD [facility]  04/28/2019

## 2019-04-28 NOTE — Telephone Encounter (Signed)
Took [facility] out of chart. Thank you.

## 2019-04-29 LAB — LIPID PANEL
Chol/HDL Ratio: 3.4 ratio (ref 0.0–4.4)
Cholesterol, Total: 193 mg/dL (ref 100–199)
HDL: 56 mg/dL (ref >39)
LDL Chol Calc (NIH): 114 mg/dL — ABNORMAL HIGH (ref 0–99)
Triglycerides: 131 mg/dL (ref 0–149)
VLDL Cholesterol Cal: 23 mg/dL (ref 5–40)

## 2019-04-29 LAB — COMPREHENSIVE METABOLIC PANEL
ALT: 24 IU/L (ref 0–32)
AST: 23 IU/L (ref 0–40)
Albumin/Globulin Ratio: 1.7 (ref 1.2–2.2)
Albumin: 4.4 g/dL (ref 3.6–4.6)
Alkaline Phosphatase: 110 IU/L (ref 39–117)
BUN/Creatinine Ratio: 16 (ref 12–28)
BUN: 16 mg/dL (ref 8–27)
Bilirubin Total: 0.5 mg/dL (ref 0.0–1.2)
CO2: 25 mmol/L (ref 20–29)
Calcium: 11.6 mg/dL — ABNORMAL HIGH (ref 8.7–10.3)
Chloride: 103 mmol/L (ref 96–106)
Creatinine, Ser: 0.97 mg/dL (ref 0.57–1.00)
GFR calc Af Amer: 61 mL/min/{1.73_m2} (ref >59)
GFR calc non Af Amer: 53 mL/min/{1.73_m2} — ABNORMAL LOW (ref >59)
Globulin, Total: 2.6 g/dL (ref 1.5–4.5)
Glucose: 109 mg/dL — ABNORMAL HIGH (ref 65–99)
Potassium: 4.2 mmol/L (ref 3.5–5.2)
Sodium: 141 mmol/L (ref 134–144)
Total Protein: 7 g/dL (ref 6.0–8.5)

## 2019-04-29 LAB — CBC WITH DIFFERENTIAL/PLATELET
Basophils Absolute: 0 10*3/uL (ref 0.0–0.2)
Basos: 1 %
EOS (ABSOLUTE): 0.1 10*3/uL (ref 0.0–0.4)
Eos: 2 %
Hematocrit: 34.8 % (ref 34.0–46.6)
Hemoglobin: 11.9 g/dL (ref 11.1–15.9)
Immature Grans (Abs): 0 10*3/uL (ref 0.0–0.1)
Immature Granulocytes: 0 %
Lymphocytes Absolute: 2.1 10*3/uL (ref 0.7–3.1)
Lymphs: 35 %
MCH: 31 pg (ref 26.6–33.0)
MCHC: 34.2 g/dL (ref 31.5–35.7)
MCV: 91 fL (ref 79–97)
Monocytes Absolute: 0.8 10*3/uL (ref 0.1–0.9)
Monocytes: 13 %
Neutrophils Absolute: 2.9 10*3/uL (ref 1.4–7.0)
Neutrophils: 49 %
Platelets: 258 10*3/uL (ref 150–450)
RBC: 3.84 x10E6/uL (ref 3.77–5.28)
RDW: 12.5 % (ref 11.7–15.4)
WBC: 6 10*3/uL (ref 3.4–10.8)

## 2019-04-29 LAB — TSH: TSH: 2.23 u[IU]/mL (ref 0.450–4.500)

## 2019-05-01 NOTE — Progress Notes (Signed)
Patient informed of normal labs and elevated calcium due to parathyroid.

## 2019-05-02 ENCOUNTER — Other Ambulatory Visit: Payer: Self-pay | Admitting: Internal Medicine

## 2019-05-02 DIAGNOSIS — R918 Other nonspecific abnormal finding of lung field: Secondary | ICD-10-CM

## 2019-05-03 ENCOUNTER — Ambulatory Visit
Admission: RE | Admit: 2019-05-03 | Discharge: 2019-05-03 | Disposition: A | Discharge status: Home / Self Care | Payer: Medicare Other | Source: Ambulatory Visit | Attending: Internal Medicine | Admitting: Internal Medicine

## 2019-05-03 ENCOUNTER — Other Ambulatory Visit: Payer: Self-pay

## 2019-05-03 DIAGNOSIS — R918 Other nonspecific abnormal finding of lung field: Secondary | ICD-10-CM | POA: Diagnosis not present

## 2019-05-03 DIAGNOSIS — R911 Solitary pulmonary nodule: Secondary | ICD-10-CM

## 2019-05-03 DIAGNOSIS — IMO0001 Reserved for inherently not codable concepts without codable children: Secondary | ICD-10-CM

## 2019-05-29 DIAGNOSIS — M19011 Primary osteoarthritis, right shoulder: Secondary | ICD-10-CM | POA: Diagnosis not present

## 2019-06-19 DIAGNOSIS — E210 Primary hyperparathyroidism: Secondary | ICD-10-CM | POA: Diagnosis not present

## 2019-06-19 DIAGNOSIS — M810 Age-related osteoporosis without current pathological fracture: Secondary | ICD-10-CM | POA: Diagnosis not present

## 2019-06-26 ENCOUNTER — Encounter (INDEPENDENT_AMBULATORY_CARE_PROVIDER_SITE_OTHER): Payer: Self-pay

## 2019-06-26 ENCOUNTER — Ambulatory Visit
Admission: RE | Admit: 2019-06-26 | Discharge: 2019-06-26 | Disposition: A | Discharge status: Home / Self Care | Payer: Medicare Other | Source: Ambulatory Visit | Attending: Internal Medicine | Admitting: Internal Medicine

## 2019-06-26 ENCOUNTER — Other Ambulatory Visit: Payer: Self-pay

## 2019-06-26 DIAGNOSIS — Z1231 Encounter for screening mammogram for malignant neoplasm of breast: Secondary | ICD-10-CM | POA: Insufficient documentation

## 2019-07-12 ENCOUNTER — Ambulatory Visit: Payer: Medicare Other | Admitting: Internal Medicine

## 2019-07-12 DIAGNOSIS — H0014 Chalazion left upper eyelid: Secondary | ICD-10-CM | POA: Diagnosis not present

## 2019-07-12 DIAGNOSIS — H02831 Dermatochalasis of right upper eyelid: Secondary | ICD-10-CM | POA: Diagnosis not present

## 2019-07-12 DIAGNOSIS — H02834 Dermatochalasis of left upper eyelid: Secondary | ICD-10-CM | POA: Diagnosis not present

## 2019-07-18 DIAGNOSIS — R911 Solitary pulmonary nodule: Secondary | ICD-10-CM | POA: Diagnosis not present

## 2019-07-18 DIAGNOSIS — N390 Urinary tract infection, site not specified: Secondary | ICD-10-CM | POA: Diagnosis not present

## 2019-08-28 DIAGNOSIS — M19011 Primary osteoarthritis, right shoulder: Secondary | ICD-10-CM | POA: Diagnosis not present

## 2019-08-28 DIAGNOSIS — M25511 Pain in right shoulder: Secondary | ICD-10-CM | POA: Diagnosis not present

## 2019-09-06 DIAGNOSIS — R0600 Dyspnea, unspecified: Secondary | ICD-10-CM | POA: Diagnosis not present

## 2019-09-06 DIAGNOSIS — R918 Other nonspecific abnormal finding of lung field: Secondary | ICD-10-CM | POA: Diagnosis not present

## 2019-09-06 DIAGNOSIS — R0609 Other forms of dyspnea: Secondary | ICD-10-CM | POA: Diagnosis not present

## 2019-09-06 DIAGNOSIS — R911 Solitary pulmonary nodule: Secondary | ICD-10-CM | POA: Diagnosis not present

## 2019-09-06 DIAGNOSIS — R0602 Shortness of breath: Secondary | ICD-10-CM | POA: Diagnosis not present

## 2019-09-06 DIAGNOSIS — Z87891 Personal history of nicotine dependence: Secondary | ICD-10-CM | POA: Diagnosis not present

## 2019-11-10 DIAGNOSIS — Z961 Presence of intraocular lens: Secondary | ICD-10-CM | POA: Diagnosis not present

## 2019-11-10 DIAGNOSIS — H532 Diplopia: Secondary | ICD-10-CM | POA: Diagnosis not present

## 2019-11-10 DIAGNOSIS — H519 Unspecified disorder of binocular movement: Secondary | ICD-10-CM | POA: Diagnosis not present

## 2019-11-10 DIAGNOSIS — H18599 Other hereditary corneal dystrophies, unspecified eye: Secondary | ICD-10-CM | POA: Diagnosis not present

## 2019-11-27 DIAGNOSIS — M25511 Pain in right shoulder: Secondary | ICD-10-CM | POA: Diagnosis not present

## 2020-01-29 DIAGNOSIS — M25511 Pain in right shoulder: Secondary | ICD-10-CM | POA: Diagnosis not present

## 2020-02-08 DIAGNOSIS — M791 Myalgia, unspecified site: Secondary | ICD-10-CM | POA: Diagnosis not present

## 2020-03-10 DIAGNOSIS — M791 Myalgia, unspecified site: Secondary | ICD-10-CM | POA: Diagnosis not present

## 2020-03-27 DIAGNOSIS — R1901 Right upper quadrant abdominal swelling, mass and lump: Secondary | ICD-10-CM | POA: Diagnosis not present

## 2020-03-27 DIAGNOSIS — D490 Neoplasm of unspecified behavior of digestive system: Secondary | ICD-10-CM | POA: Diagnosis not present

## 2020-04-08 DIAGNOSIS — R918 Other nonspecific abnormal finding of lung field: Secondary | ICD-10-CM | POA: Diagnosis not present

## 2020-04-08 DIAGNOSIS — R911 Solitary pulmonary nodule: Secondary | ICD-10-CM | POA: Diagnosis not present

## 2020-04-08 DIAGNOSIS — K219 Gastro-esophageal reflux disease without esophagitis: Secondary | ICD-10-CM | POA: Diagnosis not present

## 2020-04-08 DIAGNOSIS — Z79899 Other long term (current) drug therapy: Secondary | ICD-10-CM | POA: Diagnosis not present

## 2020-04-08 DIAGNOSIS — J849 Interstitial pulmonary disease, unspecified: Secondary | ICD-10-CM | POA: Diagnosis not present

## 2020-04-08 DIAGNOSIS — Z23 Encounter for immunization: Secondary | ICD-10-CM | POA: Diagnosis not present

## 2020-04-09 DIAGNOSIS — M791 Myalgia, unspecified site: Secondary | ICD-10-CM | POA: Diagnosis not present

## 2020-04-09 DIAGNOSIS — K760 Fatty (change of) liver, not elsewhere classified: Secondary | ICD-10-CM | POA: Diagnosis not present

## 2020-04-09 DIAGNOSIS — K862 Cyst of pancreas: Secondary | ICD-10-CM | POA: Diagnosis not present

## 2020-04-09 DIAGNOSIS — R1901 Right upper quadrant abdominal swelling, mass and lump: Secondary | ICD-10-CM | POA: Diagnosis not present

## 2020-04-09 DIAGNOSIS — D490 Neoplasm of unspecified behavior of digestive system: Secondary | ICD-10-CM | POA: Diagnosis not present

## 2020-04-18 DIAGNOSIS — M25511 Pain in right shoulder: Secondary | ICD-10-CM | POA: Diagnosis not present

## 2020-04-29 ENCOUNTER — Ambulatory Visit: Payer: Medicare Other

## 2020-05-02 ENCOUNTER — Ambulatory Visit (INDEPENDENT_AMBULATORY_CARE_PROVIDER_SITE_OTHER): Payer: Medicare Other | Admitting: Internal Medicine

## 2020-05-02 ENCOUNTER — Other Ambulatory Visit: Payer: Self-pay

## 2020-05-02 ENCOUNTER — Encounter: Payer: Self-pay | Admitting: Internal Medicine

## 2020-05-02 VITALS — BP 136/84 | HR 75 | Temp 98.2°F | Ht 66.0 in | Wt 171.0 lb

## 2020-05-02 DIAGNOSIS — K219 Gastro-esophageal reflux disease without esophagitis: Secondary | ICD-10-CM | POA: Diagnosis not present

## 2020-05-02 DIAGNOSIS — Z1231 Encounter for screening mammogram for malignant neoplasm of breast: Secondary | ICD-10-CM

## 2020-05-02 DIAGNOSIS — Z0000 Encounter for general adult medical examination without abnormal findings: Secondary | ICD-10-CM

## 2020-05-02 DIAGNOSIS — E210 Primary hyperparathyroidism: Secondary | ICD-10-CM | POA: Diagnosis not present

## 2020-05-02 DIAGNOSIS — R0600 Dyspnea, unspecified: Secondary | ICD-10-CM

## 2020-05-02 DIAGNOSIS — I700 Atherosclerosis of aorta: Secondary | ICD-10-CM | POA: Diagnosis not present

## 2020-05-02 DIAGNOSIS — D519 Vitamin B12 deficiency anemia, unspecified: Secondary | ICD-10-CM | POA: Diagnosis not present

## 2020-05-02 DIAGNOSIS — I10 Essential (primary) hypertension: Secondary | ICD-10-CM

## 2020-05-02 DIAGNOSIS — M810 Age-related osteoporosis without current pathological fracture: Secondary | ICD-10-CM

## 2020-05-02 DIAGNOSIS — R0609 Other forms of dyspnea: Secondary | ICD-10-CM

## 2020-05-02 DIAGNOSIS — R7989 Other specified abnormal findings of blood chemistry: Secondary | ICD-10-CM | POA: Diagnosis not present

## 2020-05-02 LAB — POCT URINALYSIS DIPSTICK
Bilirubin, UA: NEGATIVE
Blood, UA: NEGATIVE
Glucose, UA: NEGATIVE
Ketones, UA: NEGATIVE
Leukocytes, UA: NEGATIVE
Nitrite, UA: NEGATIVE
Protein, UA: NEGATIVE
Spec Grav, UA: >=1.03 — AB (ref 1.010–1.025)
Urobilinogen, UA: 0.2 E.U./dL
pH, UA: 6 (ref 5.0–8.0)

## 2020-05-02 MED ORDER — FUROSEMIDE 20 MG PO TABS: 20.0000 mg | ORAL_TABLET | Freq: Two times a day (BID) | ORAL | 3 refills | Status: DC

## 2020-05-02 NOTE — Patient Instructions (Signed)
Try taking 1/2 of the Colestipol daily

## 2020-05-02 NOTE — Progress Notes (Signed)
Date:  05/02/2020   Name:  Jacqulyn Barresi   DOB:  December 18, 1929   MRN:  [id number]   Chief Complaint: Annual Exam (breast exam no pap )  Joanna Sanders is a 86 y.o. female who presents today for her Complete Annual Exam. She feels well. She reports exercising walking half a mile 3 times a week. She reports she is sleeping fairly well. Breast complaints none.  Mammogram: 06/2019 DEXA: 02/2015 Colonoscopy: 01/2016  Immunization History  Administered Date(s) Administered  . Fluad Quad(high Dose 65+) 04/10/2019, 04/08/2020  . Influenza, High Dose Seasonal PF 04/13/2017, 04/12/2018, 04/08/2020  . Influenza,inj,Quad PF,6+ Mos 04/06/2016  . Influenza-Unspecified 02/18/2015  . PFIZER SARS-COV-2 Vaccination 07/17/2019, 08/07/2019  . Pneumococcal Conjugate-13 03/20/2014  . Pneumococcal Polysaccharide-23 07/07/2005  . Tdap 09/26/2015  . Zoster 07/08/2007  . Zoster Recombinat (Shingrix) 06/23/2018, 09/08/2018    Hypertension Associated symptoms include shortness of breath. Pertinent negatives include no chest pain (when walking ), headaches or palpitations.  Gastroesophageal Reflux She complains of abdominal pain (comes and goes, come when pt has diarrhea) and heartburn. She reports no chest pain (when walking ), no coughing or no wheezing. This is a recurrent problem. The problem occurs occasionally. Pertinent negatives include no fatigue. She has tried a PPI for the symptoms. The treatment provided significant relief.  Shortness of Breath This is a new problem. Associated symptoms include abdominal pain (comes and goes, come when pt has diarrhea). Pertinent negatives include no chest pain (when walking ), fever, headaches, leg swelling, rash, vomiting or wheezing. The symptoms are aggravated by exercise (she gets short of breath on her 1/2 mile walk every day). She has tried nothing for the symptoms.  Hyperparathyroidism - seeing Endo in December and DEXA is planned. Pulmonary  nodules:  Seen by radiology.  Had CT repeat this month. CT 04/08/20: Impression:   1. New scattered right lung pulmonary groundglass opacities, one of which  appears part solid, favored to be infectious or inflammatory in etiology.  Recommend follow-up CT to ensure resolution in 3-6 months.   2. Multiple stable less than 4 mm solid pulmonary nodules.   Lab Results  Component Value Date   CREATININE 0.97 04/28/2019   BUN 16 04/28/2019   NA 141 04/28/2019   K 4.2 04/28/2019   CL 103 04/28/2019   CO2 25 04/28/2019   Lab Results  Component Value Date   CHOL 193 04/28/2019   HDL 56 04/28/2019   LDLCALC 114 (H) 04/28/2019   TRIG 131 04/28/2019   CHOLHDL 3.4 04/28/2019   Lab Results  Component Value Date   TSH 2.230 04/28/2019   Lab Results  Component Value Date   HGBA1C 5.8 (H) 04/13/2017   Lab Results  Component Value Date   WBC 6.0 04/28/2019   HGB 11.9 04/28/2019   HCT 34.8 04/28/2019   MCV 91 04/28/2019   PLT 258 04/28/2019   Lab Results  Component Value Date   ALT 24 04/28/2019   AST 23 04/28/2019   ALKPHOS 110 04/28/2019   BILITOT 0.5 04/28/2019     Review of Systems  Constitutional: Negative for chills, fatigue and fever.  HENT: Negative for congestion, hearing loss, tinnitus, trouble swallowing and voice change.   Eyes: Negative for visual disturbance.  Respiratory: Positive for shortness of breath. Negative for cough, chest tightness and wheezing.   Cardiovascular: Negative for chest pain (when walking ), palpitations and leg swelling.  Gastrointestinal: Positive for abdominal pain (comes and goes, come when  pt has diarrhea), diarrhea (X2 years, comes and goes) and heartburn. Negative for constipation and vomiting.  Endocrine: Negative for polydipsia and polyuria.  Genitourinary: Negative for dysuria, frequency, genital sores, vaginal bleeding and vaginal discharge.  Musculoskeletal: Positive for joint swelling (arthritis, not everyday ). Negative for  arthralgias and gait problem.  Skin: Negative for color change and rash.  Neurological: Negative for dizziness, tremors, light-headedness and headaches.  Hematological: Negative for adenopathy. Does not bruise/bleed easily.  Psychiatric/Behavioral: Negative for dysphoric mood and sleep disturbance. The patient is not nervous/anxious.     Patient Active Problem List   Diagnosis Date Noted  . IPMN (intraductal papillary mucinous neoplasm) 06/06/2018  . Postcholecystectomy diarrhea 06/06/2018  . Plantar fascial fibromatosis 10/12/2017  . Osteoarthritis of knee 10/12/2017  . Thigh numbness 10/12/2017  . OSA (obstructive sleep apnea) 09/18/2016  . Chronic left shoulder pain 06/18/2016  . Abdominal pain, chronic, right upper quadrant 04/29/2016  . 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 Age-related osteoporosis without current pathological fracture 04/06/2016  . Allergic rhinitis 01/24/2015  . Benign essential HTN 01/24/2015  . Hyperlipidemia, mixed 01/24/2015  . Gastroesophageal reflux disease 01/24/2015  . Generalized OA 01/24/2015  . Hyperparathyroidism, primary ([place]) 01/24/2015  . Avitaminosis D 01/24/2015    Allergies  Allergen Reactions  . Nitrofuran Derivatives Diarrhea  . Amoxicillin Itching    Believes that was amoxicillin. Not 010% certain which antibiotic.  Believes that was amoxicillin. Not 932% certain which antibiotic.   Marland Kitchen Hydrochlorothiazide     hypercalcemia  . Prednisone Other (See Comments)    Gets really hyper    Past Surgical History:  Procedure Laterality Date  . CARPAL TUNNEL RELEASE Right   . CATARACT EXTRACTION    . CHOLECYSTECTOMY  2013  . TOTAL SHOULDER REPLACEMENT Left 2013    Social History   Tobacco Use  . Smoking status: Former Smoker    Quit date: 07/06/1996    Years since quitting: 23.8  . Smokeless tobacco: Never Used  . Tobacco comment: smoking cessation materials not required  Vaping Use  . Vaping Use: Never used  Substance Use Topics  .  Alcohol use: Not on file  . Drug use: No     Medication list has been reviewed and updated.  Current Meds  Medication Sig  . alendronate (FOSAMAX) 70 MG tablet Take 1 tablet (70 mg total) by mouth once a week. (Patient taking differently: Take 35 mg by mouth once a week. )  . cholecalciferol (VITAMIN D) 1000 UNITS tablet Take 1 tablet by mouth daily.  . colestipol (COLESTID) 1 g tablet Take 1 g by mouth 2 (two) times daily. PRN  . esomeprazole (NEXIUM) 40 MG capsule Take 40 mg by mouth daily at 12 noon.  . furosemide (LASIX) 20 MG tablet Take 1 tablet (20 mg total) by mouth 2 (two) times daily.  . meclizine (ANTIVERT) 12.5 MG tablet Take 1 tablet (12.5 mg total) by mouth 3 (three) times daily as needed for dizziness.  . ondansetron (ZOFRAN) 4 MG tablet Take 1 tablet (4 mg total) by mouth every 8 (eight) hours as needed for nausea or vomiting.  . traMADol (ULTRAM) 50 MG tablet Take 50 mg by mouth every 6 (six) hours as needed.  . [DISCONTINUED] furosemide (LASIX) 20 MG tablet Take 1 tablet (20 mg total) by mouth 2 (two) times daily.    PHQ 2/9 Scores 05/02/2020 04/28/2019 04/26/2019 04/10/2019  PHQ - 2 Score 0 0 0 0  PHQ- 9 Score 2 - - -  GAD 7 : Generalized Anxiety Score 05/02/2020  Nervous, Anxious, on Edge 0  Control/stop worrying 0  Worry too much - different things 0  Trouble relaxing 0  Restless 0  Easily annoyed or irritable 0  Afraid - awful might happen 0  Total GAD 7 Score 0  Anxiety Difficulty Not difficult at all    BP Readings from Last 3 Encounters:  05/02/20 136/84  04/28/19 132/82  04/10/19 112/72    Physical Exam Vitals and nursing note reviewed.  Constitutional:      General: She is not in acute distress.    Appearance: She is well-developed.  HENT:     Head: Normocephalic and atraumatic.     Right Ear: Tympanic membrane and ear canal normal.     Left Ear: Tympanic membrane and ear canal normal.     Nose:     Right Sinus: No maxillary sinus  tenderness.     Left Sinus: No maxillary sinus tenderness.  Eyes:     General: No scleral icterus.       Right eye: No discharge.        Left eye: No discharge.     Conjunctiva/sclera: Conjunctivae normal.  Neck:     Thyroid: No thyromegaly.     Vascular: No carotid bruit.  Cardiovascular:     Rate and Rhythm: Normal rate and regular rhythm.     Pulses: Normal pulses.     Heart sounds: Normal heart sounds.  Pulmonary:     Effort: Pulmonary effort is normal. No respiratory distress.     Breath sounds: No wheezing.  Chest:     Breasts:        Right: No mass, nipple discharge, skin change or tenderness.        Left: No mass, nipple discharge, skin change or tenderness.  Abdominal:     General: Bowel sounds are normal.     Palpations: Abdomen is soft.     Tenderness: There is no abdominal tenderness.  Musculoskeletal:     Cervical back: Normal range of motion. No erythema.     Right lower leg: No edema.     Left lower leg: No edema.  Lymphadenopathy:     Cervical: No cervical adenopathy.  Skin:    General: Skin is warm and dry.     Findings: No rash.  Neurological:     General: No focal deficit present.     Mental Status: She is alert and oriented to person, place, and time.     Cranial Nerves: No cranial nerve deficit.     Sensory: No sensory deficit.     Deep Tendon Reflexes: Reflexes are normal and symmetric.  Psychiatric:        Attention and Perception: Attention normal.        Mood and Affect: Mood normal.        Behavior: Behavior normal.        Cognition and Memory: Cognition normal.     Wt Readings from Last 3 Encounters:  05/02/20 171 lb (77.6 kg)  04/28/19 173 lb (78.5 kg)  04/26/19 175 lb (79.4 kg)    BP 136/84   Pulse 75   Temp 98.2 F (36.8 C) (Oral)   Ht 5\' 6"  (1.676 m)   Wt 171 lb (77.6 kg)   SpO2 97%   BMI 27.60 kg/m   Assessment and Plan: 1. Annual physical exam Continue healthy diet; exercise as able but hold off until cardiology  evaluation - Lipid panel -  POCT urinalysis dipstick  2. Encounter for screening mammogram for breast cancer - MM 3D SCREEN BREAST BILATERAL; Future  3. Benign essential HTN Clinically stable exam with well controlled BP on lasix. Tolerating medications without side effects at this time. Pt to continue current regimen and low sodium diet; benefits of regular exercise as able discussed. Referral to cardiology made. - CBC with Differential/Platelet - Comprehensive metabolic panel - TSH - furosemide (LASIX) 20 MG tablet; Take 1 tablet (20 mg total) by mouth 2 (two) times daily.  Dispense: 180 tablet; Refill: 3  4. Gastroesophageal reflux disease, unspecified whether esophagitis present Ongoing gerd, diarrhea and abdominal bloating with intermittent constipation. She is using Colestipol only PRN after diarrhea rather than daily Recommend 1/2 tablet daily the adjust to 1-2 soft stools per day - CBC with Differential/Platelet  5. Age-related osteoporosis without current pathological fracture Followed by Endocrinology DEXA planned. Continue Fosamax and vitamin D  6. Hyperparathyroidism, primary ([place]) Followed by Endocrinology - will not obtain PTH today in view of upcoming Endo appointment - VITAMIN D 25 Hydroxy (Vit-D Deficiency, Fractures) - Vitamin B12  7. Aortic atherosclerosis ([place]) Seen on CT scan May need to consider statin medication  8. DOE (dyspnea on exertion) This is a new symptom and may be secondary to the CT chest findings. She was advised to not eat after supper which has significantly reduced her episodes of gerd. However, I feel that she deserves a cardiac evaluation. - Ambulatory referral to Cardiology   Partially dictated using [place]. Any errors are unintentional.  Halina Maidens, MD [facility]  05/02/2020

## 2020-05-03 LAB — CBC WITH DIFFERENTIAL/PLATELET
Basophils Absolute: 0.1 10*3/uL (ref 0.0–0.2)
Basos: 1 %
EOS (ABSOLUTE): 0.1 10*3/uL (ref 0.0–0.4)
Eos: 2 %
Hematocrit: 30.4 % — ABNORMAL LOW (ref 34.0–46.6)
Hemoglobin: 10.3 g/dL — ABNORMAL LOW (ref 11.1–15.9)
Immature Grans (Abs): 0 10*3/uL (ref 0.0–0.1)
Immature Granulocytes: 1 %
Lymphocytes Absolute: 2.2 10*3/uL (ref 0.7–3.1)
Lymphs: 35 %
MCH: 31.5 pg (ref 26.6–33.0)
MCHC: 33.9 g/dL (ref 31.5–35.7)
MCV: 93 fL (ref 79–97)
Monocytes Absolute: 0.6 10*3/uL (ref 0.1–0.9)
Monocytes: 10 %
Neutrophils Absolute: 3.3 10*3/uL (ref 1.4–7.0)
Neutrophils: 51 %
Platelets: 265 10*3/uL (ref 150–450)
RBC: 3.27 x10E6/uL — ABNORMAL LOW (ref 3.77–5.28)
RDW: 12.4 % (ref 11.7–15.4)
WBC: 6.4 10*3/uL (ref 3.4–10.8)

## 2020-05-03 LAB — LIPID PANEL
Chol/HDL Ratio: 3.5 ratio (ref 0.0–4.4)
Cholesterol, Total: 192 mg/dL (ref 100–199)
HDL: 55 mg/dL (ref >39)
LDL Chol Calc (NIH): 112 mg/dL — ABNORMAL HIGH (ref 0–99)
Triglycerides: 143 mg/dL (ref 0–149)
VLDL Cholesterol Cal: 25 mg/dL (ref 5–40)

## 2020-05-03 LAB — COMPREHENSIVE METABOLIC PANEL
ALT: 16 IU/L (ref 0–32)
AST: 16 IU/L (ref 0–40)
Albumin/Globulin Ratio: 2.4 — ABNORMAL HIGH (ref 1.2–2.2)
Albumin: 4.3 g/dL (ref 3.6–4.6)
Alkaline Phosphatase: 102 IU/L (ref 44–121)
BUN/Creatinine Ratio: 13 (ref 12–28)
BUN: 12 mg/dL (ref 8–27)
Bilirubin Total: 0.3 mg/dL (ref 0.0–1.2)
CO2: 23 mmol/L (ref 20–29)
Calcium: 11.3 mg/dL — ABNORMAL HIGH (ref 8.7–10.3)
Chloride: 106 mmol/L (ref 96–106)
Creatinine, Ser: 0.95 mg/dL (ref 0.57–1.00)
GFR calc Af Amer: 62 mL/min/{1.73_m2} (ref >59)
GFR calc non Af Amer: 54 mL/min/{1.73_m2} — ABNORMAL LOW (ref >59)
Globulin, Total: 1.8 g/dL (ref 1.5–4.5)
Glucose: 105 mg/dL — ABNORMAL HIGH (ref 65–99)
Potassium: 4.3 mmol/L (ref 3.5–5.2)
Sodium: 142 mmol/L (ref 134–144)
Total Protein: 6.1 g/dL (ref 6.0–8.5)

## 2020-05-03 LAB — TSH: TSH: 1.32 u[IU]/mL (ref 0.450–4.500)

## 2020-05-03 LAB — VITAMIN B12: Vitamin B-12: 1091 pg/mL (ref 232–1245)

## 2020-05-03 LAB — VITAMIN D 25 HYDROXY (VIT D DEFICIENCY, FRACTURES): Vit D, 25-Hydroxy: 27.1 ng/mL — ABNORMAL LOW (ref 30.0–100.0)

## 2020-05-07 LAB — SPECIMEN STATUS REPORT

## 2020-05-07 LAB — IRON AND TIBC
Iron Saturation: 14 % — ABNORMAL LOW (ref 15–55)
Iron: 44 ug/dL (ref 27–139)
Total Iron Binding Capacity: 313 ug/dL (ref 250–450)
UIBC: 269 ug/dL (ref 118–369)

## 2020-05-07 LAB — FERRITIN: Ferritin: 47 ng/mL (ref 15–150)

## 2020-05-09 DIAGNOSIS — R5383 Other fatigue: Secondary | ICD-10-CM | POA: Diagnosis not present

## 2020-05-09 DIAGNOSIS — I10 Essential (primary) hypertension: Secondary | ICD-10-CM | POA: Diagnosis not present

## 2020-05-09 DIAGNOSIS — R0600 Dyspnea, unspecified: Secondary | ICD-10-CM | POA: Diagnosis not present

## 2020-05-09 DIAGNOSIS — R5381 Other malaise: Secondary | ICD-10-CM | POA: Diagnosis not present

## 2020-05-10 DIAGNOSIS — M791 Myalgia, unspecified site: Secondary | ICD-10-CM | POA: Diagnosis not present

## 2020-05-20 DIAGNOSIS — R5381 Other malaise: Secondary | ICD-10-CM | POA: Diagnosis not present

## 2020-05-20 DIAGNOSIS — R0600 Dyspnea, unspecified: Secondary | ICD-10-CM | POA: Diagnosis not present

## 2020-05-20 DIAGNOSIS — I10 Essential (primary) hypertension: Secondary | ICD-10-CM | POA: Diagnosis not present

## 2020-05-20 DIAGNOSIS — R5383 Other fatigue: Secondary | ICD-10-CM | POA: Diagnosis not present

## 2020-06-03 DIAGNOSIS — M25511 Pain in right shoulder: Secondary | ICD-10-CM | POA: Diagnosis not present

## 2020-06-04 ENCOUNTER — Telehealth: Payer: Self-pay | Admitting: Internal Medicine

## 2020-06-04 NOTE — Telephone Encounter (Signed)
Pt will call back to schedule Medicare Annual Wellness Visit (AWV) either virtually or in office. Whichever the patients preference is.  Last AWV 04/26/19, please schedule at anytime with [facility] Health Advisor.  This should be a 40 minute visit.

## 2020-06-10 ENCOUNTER — Other Ambulatory Visit: Payer: Self-pay

## 2020-06-10 ENCOUNTER — Telehealth: Payer: Self-pay

## 2020-06-10 ENCOUNTER — Other Ambulatory Visit: Payer: Self-pay | Admitting: Internal Medicine

## 2020-06-10 ENCOUNTER — Ambulatory Visit (INDEPENDENT_AMBULATORY_CARE_PROVIDER_SITE_OTHER): Payer: Medicare Other | Admitting: Internal Medicine

## 2020-06-10 ENCOUNTER — Encounter: Payer: Self-pay | Admitting: Internal Medicine

## 2020-06-10 VITALS — BP 132/70 | HR 70 | Ht 66.0 in | Wt 169.0 lb

## 2020-06-10 DIAGNOSIS — R300 Dysuria: Secondary | ICD-10-CM | POA: Diagnosis not present

## 2020-06-10 DIAGNOSIS — E611 Iron deficiency: Secondary | ICD-10-CM

## 2020-06-10 LAB — POC URINALYSIS WITH MICROSCOPIC (NON AUTO)MANUAL RESULT
Bilirubin, UA: NEGATIVE
Blood, UA: NEGATIVE
Crystals: 0
Epithelial cells, urine per micros: 0
Glucose, UA: NEGATIVE
Ketones, UA: NEGATIVE
Mucus, UA: 0
Nitrite, UA: POSITIVE
Protein, UA: NEGATIVE
RBC: 0 M/uL — AB (ref 4.04–5.48)
Spec Grav, UA: <=1.005 — AB (ref 1.010–1.025)
Urobilinogen, UA: 0.2 E.U./dL
WBC Casts, UA: 5
pH, UA: 7 (ref 5.0–8.0)

## 2020-06-10 MED ORDER — SULFAMETHOXAZOLE-TRIMETHOPRIM 800-160 MG PO TABS: 1.0000 | ORAL_TABLET | Freq: Two times a day (BID) | ORAL | 0 refills | Status: AC

## 2020-06-10 NOTE — Telephone Encounter (Unsigned)
Copied from [place] [id number]. Topic: General - Other >> Jun 10, 2020  9:30 AM Celene Kras wrote: Reason for CRM: Pt called stating that she believes she has a UTI and is requesting to do a urine specimen today since she is already coming in for lab work. Pt declined appt. Please advise.

## 2020-06-10 NOTE — Telephone Encounter (Signed)
Noted  KP 

## 2020-06-10 NOTE — Telephone Encounter (Signed)
Pt coming in this morning at 11:20

## 2020-06-10 NOTE — Progress Notes (Signed)
Date:  06/10/2020   Name:  Joanna Sanders   DOB:  1930-02-13   MRN:  [id number]   Chief Complaint: Urinary Tract Infection (Frequency, mild burning, started 3 days ago. Took AZO yesterday. )  Urinary Tract Infection  This is a recurrent problem. Episode onset: three days ago. The problem occurs every urination. The quality of the pain is described as burning. The pain is mild. There has been no fever. Associated symptoms include frequency and urgency. Pertinent negatives include no chills, flank pain, hematuria, hesitancy or nausea. Treatments tried: AZO.   Mild anemia with low iron levels - started on iron supplements 6 weeks ago.  Tolerating it fairly well.  May feel slightly more energetic. Has not increased her intake of iron rich foods.  Lab Results  Component Value Date   CREATININE 0.95 05/02/2020   BUN 12 05/02/2020   NA 142 05/02/2020   K 4.3 05/02/2020   CL 106 05/02/2020   CO2 23 05/02/2020   Lab Results  Component Value Date   CHOL 192 05/02/2020   HDL 55 05/02/2020   LDLCALC 112 (H) 05/02/2020   TRIG 143 05/02/2020   CHOLHDL 3.5 05/02/2020   Lab Results  Component Value Date   TSH 1.320 05/02/2020   Lab Results  Component Value Date   HGBA1C 5.8 (H) 04/13/2017   Lab Results  Component Value Date   WBC 6.4 05/02/2020   HGB 10.3 (L) 05/02/2020   HCT 30.4 (L) 05/02/2020   MCV 93 05/02/2020   PLT 265 05/02/2020   Lab Results  Component Value Date   ALT 16 05/02/2020   AST 16 05/02/2020   ALKPHOS 102 05/02/2020   BILITOT 0.3 05/02/2020     Review of Systems  Constitutional: Negative for chills, fatigue and fever.  Respiratory: Negative for cough, chest tightness and shortness of breath.   Cardiovascular: Negative for chest pain and leg swelling.  Gastrointestinal: Negative for nausea.  Genitourinary: Positive for frequency and urgency. Negative for difficulty urinating, flank pain, hematuria and hesitancy.  Neurological: Negative for  dizziness and headaches.    Patient Active Problem List   Diagnosis Date Noted  . IPMN (intraductal papillary mucinous neoplasm) 06/06/2018  . Postcholecystectomy diarrhea 06/06/2018  . Plantar fascial fibromatosis 10/12/2017  . Osteoarthritis of knee 10/12/2017  . Thigh numbness 10/12/2017  . OSA (obstructive sleep apnea) 09/18/2016  . Chronic left shoulder pain 06/18/2016  . Abdominal pain, chronic, right upper quadrant 04/29/2016  . 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 Age-related osteoporosis without current pathological fracture 04/06/2016  . Allergic rhinitis 01/24/2015  . Benign essential HTN 01/24/2015  . Hyperlipidemia, mixed 01/24/2015  . Gastroesophageal reflux disease 01/24/2015  . Generalized OA 01/24/2015  . Hyperparathyroidism, primary ([place]) 01/24/2015  . Avitaminosis D 01/24/2015    Allergies  Allergen Reactions  . Nitrofuran Derivatives Diarrhea  . Amoxicillin Itching    Believes that was amoxicillin. Not 998% certain which antibiotic.  Believes that was amoxicillin. Not 338% certain which antibiotic.   Marland Kitchen Hydrochlorothiazide     hypercalcemia  . Prednisone Other (See Comments)    Gets really hyper    Past Surgical History:  Procedure Laterality Date  . CARPAL TUNNEL RELEASE Right   . CATARACT EXTRACTION    . CHOLECYSTECTOMY  2013  . TOTAL SHOULDER REPLACEMENT Left 2013    Social History   Tobacco Use  . Smoking status: Former Smoker    Quit date: 07/06/1996    Years since quitting: 23.9  .  Smokeless tobacco: Never Used  . Tobacco comment: smoking cessation materials not required  Vaping Use  . Vaping Use: Never used  Substance Use Topics  . Alcohol use: Not on file  . Drug use: No     Medication list has been reviewed and updated.  Current Meds  Medication Sig  . alendronate (FOSAMAX) 70 MG tablet Take 1 tablet (70 mg total) by mouth once a week. (Patient taking differently: Take 35 mg by mouth once a week. )  . cholecalciferol (VITAMIN D)  1000 UNITS tablet Take 1 tablet by mouth daily.  . colestipol (COLESTID) 1 g tablet Take 1 g by mouth 2 (two) times daily. PRN  . esomeprazole (NEXIUM) 40 MG capsule Take 40 mg by mouth daily at 12 noon.  . furosemide (LASIX) 20 MG tablet Take 1 tablet (20 mg total) by mouth 2 (two) times daily.  . meclizine (ANTIVERT) 12.5 MG tablet Take 1 tablet (12.5 mg total) by mouth 3 (three) times daily as needed for dizziness.  . ondansetron (ZOFRAN) 4 MG tablet Take 1 tablet (4 mg total) by mouth every 8 (eight) hours as needed for nausea or vomiting.  . traMADol (ULTRAM) 50 MG tablet Take 50 mg by mouth every 6 (six) hours as needed.    PHQ 2/9 Scores 06/10/2020 05/02/2020 04/28/2019 04/26/2019  PHQ - 2 Score 0 0 0 0  PHQ- 9 Score 0 2 - -    GAD 7 : Generalized Anxiety Score 06/10/2020 05/02/2020  Nervous, Anxious, on Edge 0 0  Control/stop worrying 0 0  Worry too much - different things 0 0  Trouble relaxing 0 0  Restless 0 0  Easily annoyed or irritable 0 0  Afraid - awful might happen 0 0  Total GAD 7 Score 0 0  Anxiety Difficulty Not difficult at all Not difficult at all    BP Readings from Last 3 Encounters:  06/10/20 132/70  05/02/20 136/84  04/28/19 132/82    Physical Exam Vitals and nursing note reviewed.  Constitutional:      Appearance: Normal appearance. She is well-developed.  Cardiovascular:     Rate and Rhythm: Normal rate and regular rhythm.     Heart sounds: Normal heart sounds.  Pulmonary:     Effort: Pulmonary effort is normal. No respiratory distress.     Breath sounds: Normal breath sounds.  Abdominal:     General: Bowel sounds are normal.     Palpations: Abdomen is soft.     Tenderness: There is no abdominal tenderness. There is no right CVA tenderness, left CVA tenderness, guarding or rebound.  Musculoskeletal:     Right lower leg: No edema.     Left lower leg: No edema.  Neurological:     Mental Status: She is alert.     Wt Readings from Last 3  Encounters:  06/10/20 169 lb (76.7 kg)  05/02/20 171 lb (77.6 kg)  04/28/19 173 lb (78.5 kg)    BP 132/70   Pulse 70   Ht 5\' 6"  (1.676 m)   Wt 169 lb (76.7 kg)   SpO2 96%   BMI 27.28 kg/m   Assessment and Plan: 1. Dysuria UA is positive Continue fluids, cranberry juice - POC urinalysis w microscopic (non auto) - sulfamethoxazole-trimethoprim (BACTRIM DS) 800-160 MG tablet; Take 1 tablet by mouth 2 (two) times daily for 7 days.  Dispense: 14 tablet; Refill: 0  2. Iron deficiency Recheck labs May be able to stop iron supplements and just  manage with iron rich diet - Iron, TIBC and Ferritin Panel - CBC with Differential/Platelet   Partially dictated using [place], [place]. Any errors are unintentional.  Halina Maidens, MD [facility]  06/10/2020

## 2020-06-11 LAB — CBC WITH DIFFERENTIAL/PLATELET
Basophils Absolute: 0.1 10*3/uL (ref 0.0–0.2)
Basos: 1 %
EOS (ABSOLUTE): 0.1 10*3/uL (ref 0.0–0.4)
Eos: 2 %
Hematocrit: 35.6 % (ref 34.0–46.6)
Hemoglobin: 12.2 g/dL (ref 11.1–15.9)
Immature Grans (Abs): 0 10*3/uL (ref 0.0–0.1)
Immature Granulocytes: 1 %
Lymphocytes Absolute: 2.3 10*3/uL (ref 0.7–3.1)
Lymphs: 32 %
MCH: 31.6 pg (ref 26.6–33.0)
MCHC: 34.3 g/dL (ref 31.5–35.7)
MCV: 92 fL (ref 79–97)
Monocytes Absolute: 0.9 10*3/uL (ref 0.1–0.9)
Monocytes: 13 %
Neutrophils Absolute: 3.8 10*3/uL (ref 1.4–7.0)
Neutrophils: 51 %
Platelets: 258 10*3/uL (ref 150–450)
RBC: 3.86 x10E6/uL (ref 3.77–5.28)
RDW: 12.2 % (ref 11.7–15.4)
WBC: 7.3 10*3/uL (ref 3.4–10.8)

## 2020-06-11 LAB — IRON,TIBC AND FERRITIN PANEL
Ferritin: 43 ng/mL (ref 15–150)
Iron Saturation: 34 % (ref 15–55)
Iron: 123 ug/dL (ref 27–139)
Total Iron Binding Capacity: 359 ug/dL (ref 250–450)
UIBC: 236 ug/dL (ref 118–369)

## 2020-06-12 ENCOUNTER — Ambulatory Visit (INDEPENDENT_AMBULATORY_CARE_PROVIDER_SITE_OTHER): Payer: Medicare Other

## 2020-06-12 DIAGNOSIS — Z0000 Encounter for general adult medical examination without abnormal findings: Secondary | ICD-10-CM

## 2020-06-12 NOTE — Patient Instructions (Signed)
Joanna Sanders , Thank you for taking time to come for your Medicare Wellness Visit. I appreciate your ongoing commitment to your health goals. Please review the following plan we discussed and let me know if I can assist you in the future.   Screening recommendations/referrals: Colonoscopy: no longer required Mammogram: done 06/26/19; scheduled for 07/15/20 Bone Density: done 03/04/17; scheduled for 06/20/20 Recommended yearly ophthalmology/optometry visit for glaucoma screening and checkup Recommended yearly dental visit for hygiene and checkup  Vaccinations: Influenza vaccine: done 04/08/20 Pneumococcal vaccine: done 03/20/14 Tdap vaccine: done 09/26/15 Shingles vaccine: done 06/23/18 & 09/08/18   Covid-19: done 07/17/19, 08/07/19 & 05/07/20  Advanced directives: Please bring a copy of your health care power of attorney and living will to the office at your convenience.  Conditions/risks identified: Keep up the great work!  Next appointment: Follow up in one year for your annual wellness visit    Preventive Care 84 Years and Older, Female Preventive care refers to lifestyle choices and visits with your health care provider that can promote health and wellness. What does preventive care include?  A yearly physical exam. This is also called an annual well check.  Dental exams once or twice a year.  Routine eye exams. Ask your health care provider how often you should have your eyes checked.  Personal lifestyle choices, including:  Daily care of your teeth and gums.  Regular physical activity.  Eating a healthy diet.  Avoiding tobacco and drug use.  Limiting alcohol use.  Practicing safe sex.  Taking low-dose aspirin every day.  Taking vitamin and mineral supplements as recommended by your health care provider. What happens during an annual well check? The services and screenings done by your health care provider during your annual well check will depend on your age, overall  health, lifestyle risk factors, and family history of disease. Counseling  Your health care provider may ask you questions about your:  Alcohol use.  Tobacco use.  Drug use.  Emotional well-being.  Home and relationship well-being.  Sexual activity.  Eating habits.  History of falls.  Memory and ability to understand (cognition).  Work and work Statistician.  Reproductive health. Screening  You may have the following tests or measurements:  Height, weight, and BMI.  Blood pressure.  Lipid and cholesterol levels. These may be checked every 5 years, or more frequently if you are over 40 years old.  Skin check.  Lung cancer screening. You may have this screening every year starting at age 84 if you have a 30-pack-year history of smoking and currently smoke or have quit within the past 15 years.  Fecal occult blood test (FOBT) of the stool. You may have this test every year starting at age 84.  Flexible sigmoidoscopy or colonoscopy. You may have a sigmoidoscopy every 5 years or a colonoscopy every 10 years starting at age 84.  Hepatitis C blood test.  Hepatitis B blood test.  Sexually transmitted disease (STD) testing.  Diabetes screening. This is done by checking your blood sugar (glucose) after you have not eaten for a while (fasting). You may have this done every 1-3 years.  Bone density scan. This is done to screen for osteoporosis. You may have this done starting at age 40.  Mammogram. This may be done every 1-2 years. Talk to your health care provider about how often you should have regular mammograms. Talk with your health care provider about your test results, treatment options, and if necessary, the need for more tests.  Vaccines  Your health care provider may recommend certain vaccines, such as:  Influenza vaccine. This is recommended every year.  Tetanus, diphtheria, and acellular pertussis (Tdap, Td) vaccine. You may need a Td booster every 10  years.  Zoster vaccine. You may need this after age 84.  Pneumococcal 13-valent conjugate (PCV13) vaccine. One dose is recommended after age 84.  Pneumococcal polysaccharide (PPSV23) vaccine. One dose is recommended after age 84. Talk to your health care provider about which screenings and vaccines you need and how often you need them. This information is not intended to replace advice given to you by your health care provider. Make sure you discuss any questions you have with your health care provider. Document Released: 07/19/2015 Document Revised: 03/11/2016 Document Reviewed: 04/23/2015 Elsevier Interactive Patient Education  2017 [employer] Prevention in the Home Falls can cause injuries. They can happen to people of all ages. There are many things you can do to make your home safe and to help prevent falls. What can I do on the outside of my home?  Regularly fix the edges of walkways and driveways and fix any cracks.  Remove anything that might make you trip as you walk through a door, such as a raised step or threshold.  Trim any bushes or trees on the path to your home.  Use bright outdoor lighting.  Clear any walking paths of anything that might make someone trip, such as rocks or tools.  Regularly check to see if handrails are loose or broken. Make sure that both sides of any steps have handrails.  Any raised decks and porches should have guardrails on the edges.  Have any leaves, snow, or ice cleared regularly.  Use sand or salt on walking paths during winter.  Clean up any spills in your garage right away. This includes oil or grease spills. What can I do in the bathroom?  Use night lights.  Install grab bars by the toilet and in the tub and shower. Do not use towel bars as grab bars.  Use non-skid mats or decals in the tub or shower.  If you need to sit down in the shower, use a plastic, non-slip stool.  Keep the floor dry. Clean up any water that  spills on the floor as soon as it happens.  Remove soap buildup in the tub or shower regularly.  Attach bath mats securely with double-sided non-slip rug tape.  Do not have throw rugs and other things on the floor that can make you trip. What can I do in the bedroom?  Use night lights.  Make sure that you have a light by your bed that is easy to reach.  Do not use any sheets or blankets that are too big for your bed. They should not hang down onto the floor.  Have a firm chair that has side arms. You can use this for support while you get dressed.  Do not have throw rugs and other things on the floor that can make you trip. What can I do in the kitchen?  Clean up any spills right away.  Avoid walking on wet floors.  Keep items that you use a lot in easy-to-reach places.  If you need to reach something above you, use a strong step stool that has a grab bar.  Keep electrical cords out of the way.  Do not use floor polish or wax that makes floors slippery. If you must use wax, use non-skid floor wax.  Do not have throw rugs and other things on the floor that can make you trip. What can I do with my stairs?  Do not leave any items on the stairs.  Make sure that there are handrails on both sides of the stairs and use them. Fix handrails that are broken or loose. Make sure that handrails are as long as the stairways.  Check any carpeting to make sure that it is firmly attached to the stairs. Fix any carpet that is loose or worn.  Avoid having throw rugs at the top or bottom of the stairs. If you do have throw rugs, attach them to the floor with carpet tape.  Make sure that you have a light switch at the top of the stairs and the bottom of the stairs. If you do not have them, ask someone to add them for you. What else can I do to help prevent falls?  Wear shoes that:  Do not have high heels.  Have rubber bottoms.  Are comfortable and fit you well.  Are closed at the  toe. Do not wear sandals.  If you use a stepladder:  Make sure that it is fully opened. Do not climb a closed stepladder.  Make sure that both sides of the stepladder are locked into place.  Ask someone to hold it for you, if possible.  Clearly mark and make sure that you can see:  Any grab bars or handrails.  First and last steps.  Where the edge of each step is.  Use tools that help you move around (mobility aids) if they are needed. These include:  Canes.  Walkers.  Scooters.  Crutches.  Turn on the lights when you go into a dark area. Replace any light bulbs as soon as they burn out.  Set up your furniture so you have a clear path. Avoid moving your furniture around.  If any of your floors are uneven, fix them.  If there are any pets around you, be aware of where they are.  Review your medicines with your doctor. Some medicines can make you feel dizzy. This can increase your chance of falling. Ask your doctor what other things that you can do to help prevent falls. This information is not intended to replace advice given to you by your health care provider. Make sure you discuss any questions you have with your health care provider. Document Released: 04/18/2009 Document Revised: 11/28/2015 Document Reviewed: 07/27/2014 Elsevier Interactive Patient Education  2017 [employer].

## 2020-06-12 NOTE — Progress Notes (Signed)
Subjective:   Joanna Sanders is a 86 y.o. female who presents for Medicare Annual (Subsequent) preventive examination.  Virtual Visit via Telephone Note  I connected with  Joanna Sanders on 06/12/20 at 10:00 AM EST by telephone and verified that I am speaking with the correct person using two identifiers.  Location: Patient: home Provider: [facility] Persons participating in the virtual visit: [place]   I discussed the limitations, risks, security and privacy concerns of performing an evaluation and management service by telephone and the availability of in person appointments. The patient expressed understanding and agreed to proceed.  Interactive audio and video telecommunications were attempted between this nurse and patient, however failed, due to patient having technical difficulties OR patient did not have access to video capability.  We continued and completed visit with audio only.  Some vital signs may be absent or patient reported.   Clemetine Marker, LPN    Review of Systems     Cardiac Risk Factors include: advanced age (>77men, >67 women)     Objective:    There were no vitals filed for this visit. There is no height or weight on file to calculate BMI.  Advanced Directives 06/12/2020 04/26/2019 02/22/2017 04/04/2015 01/25/2015  Does Patient Have a Medical Advance Directive? Yes Yes Yes Yes Yes  Type of Paramedic of [place];Living will [place];Living will [place];Living will [place];Living will [place];Living will;Out of facility DNR (pink MOST or yellow form)  Copy of [place] in Chart? No - copy requested No - copy requested No - copy requested - No - copy requested    Current Medications (verified) Outpatient Encounter Medications as of 06/12/2020  Medication Sig  . alendronate (FOSAMAX) 70 MG tablet Take 1 tablet  (70 mg total) by mouth once a week. (Patient taking differently: Take 35 mg by mouth once a week. )  . cholecalciferol (VITAMIN D) 1000 UNITS tablet Take 1 tablet by mouth daily.  . colestipol (COLESTID) 1 g tablet Take 1 g by mouth 2 (two) times daily. PRN  . esomeprazole (NEXIUM) 40 MG capsule Take 40 mg by mouth daily at 12 noon.  . furosemide (LASIX) 20 MG tablet Take 1 tablet (20 mg total) by mouth 2 (two) times daily.  . meclizine (ANTIVERT) 12.5 MG tablet Take 1 tablet (12.5 mg total) by mouth 3 (three) times daily as needed for dizziness.  . ondansetron (ZOFRAN) 4 MG tablet Take 1 tablet (4 mg total) by mouth every 8 (eight) hours as needed for nausea or vomiting.  . sulfamethoxazole-trimethoprim (BACTRIM DS) 800-160 MG tablet Take 1 tablet by mouth 2 (two) times daily for 7 days.  . traMADol (ULTRAM) 50 MG tablet Take 50 mg by mouth every 6 (six) hours as needed.   No facility-administered encounter medications on file as of 06/12/2020.    Allergies (verified) Nitrofuran derivatives, Amoxicillin, Hydrochlorothiazide, and Prednisone   History: Past Medical History:  Diagnosis Date  . GERD (gastroesophageal reflux disease)   . Hyperlipidemia   . Hypertension   . Knee joint effusion 10/12/2017  . Sleep apnea    Past Surgical History:  Procedure Laterality Date  . CARPAL TUNNEL RELEASE Right   . CATARACT EXTRACTION    . CHOLECYSTECTOMY  2013  . TOTAL SHOULDER REPLACEMENT Left 2013   Family History  Problem Relation Age of Onset  . Hypertension Mother   . Leukemia Mother   .  Heart failure Father   . Breast cancer Daughter 53   Social History   Socioeconomic History  . Marital status: Widowed    Spouse name: Not on file  . Number of children: Not on file  . Years of education: Not on file  . Highest education level: Not on file  Occupational History  . Not on file  Tobacco Use  . Smoking status: Former Smoker    Quit date: 07/06/1996    Years since quitting: 23.9  .  Smokeless tobacco: Never Used  . Tobacco comment: smoking cessation materials not required  Vaping Use  . Vaping Use: Never used  Substance and Sexual Activity  . Alcohol use: Not on file  . Drug use: No  . Sexual activity: Not on file  Other Topics Concern  . Not on file  Social History Narrative   Pt lives alone   Social Determinants of Health   Financial Resource Strain: Low Risk   . Difficulty of Paying Living Expenses: Not hard at all  Food Insecurity: No Food Insecurity  . Worried About Charity fundraiser in the Last Year: Never true  . Ran Out of Food in the Last Year: Never true  Transportation Needs: No Transportation Needs  . Lack of Transportation (Medical): No  . Lack of Transportation (Non-Medical): No  Physical Activity: Insufficiently Active  . Days of Exercise per Week: 3 days  . Minutes of Exercise per Session: 20 min  Stress: No Stress Concern Present  . Feeling of Stress : Not at all  Social Connections: Moderately Integrated  . Frequency of Communication with Friends and Family: More than three times a week  . Frequency of Social Gatherings with Friends and Family: More than three times a week  . Attends Religious Services: More than 4 times per year  . Active Member of Clubs or Organizations: Yes  . Attends Archivist Meetings: More than 4 times per year  . Marital Status: Widowed    Tobacco Counseling Counseling given: Not Answered Comment: smoking cessation materials not required   Clinical Intake:  Pre-visit preparation completed: Yes  Pain : No/denies pain     Nutritional Risks: Nausea/ vomitting/ diarrhea (recent episode of diarrhea due to iron supplement but it has resolved) Diabetes: No  How often do you need to have someone help you when you read instructions, pamphlets, or other written materials from your doctor or pharmacy?: 1 - Never    Interpreter Needed?: No  Information entered by :: Clemetine Marker  LPN   Activities of Daily Living In your present state of health, do you have any difficulty performing the following activities: 06/12/2020 05/02/2020  Hearing? N N  Comment declines hearing aids -  Vision? N N  Difficulty concentrating or making decisions? N N  Walking or climbing stairs? N N  Dressing or bathing? N N  Doing errands, shopping? N N  Preparing Food and eating ? N -  Using the Toilet? N -  In the past six months, have you accidently leaked urine? N -  Do you have problems with loss of bowel control? N -  Managing your Medications? N -  Managing your Finances? N -  Housekeeping or managing your Housekeeping? N -  Some recent data might be hidden    Patient Care Team: Glean Hess, MD as PCP - General (Internal Medicine) Gale Journey, Gwen Her, MD as Referring Physician (Endocrinology) Rosine Beat, PA (Pulmonary Disease) Raynelle Jan.,  MD as Referring Physician (Gastroenterology)  Indicate any recent Medical Services you may have received from other than [facility] providers in the past year (date may be approximate).     Assessment:   This is a routine wellness examination for [place].  Hearing/Vision screen  Hearing Screening   125Hz  250Hz  500Hz  1000Hz  2000Hz  3000Hz  4000Hz  6000Hz  8000Hz   Right ear:           Left ear:           Comments: Pt c/o mild hearing difficulty and plans to have hearing evaluation in Jan 2022  Vision Screening Comments: Annual vision screenings at [facility] with Dr. Malcolm Metro  Dietary issues and exercise activities discussed: Current Exercise Habits: Home exercise routine, Type of exercise: walking, Time (Minutes): 20, Frequency (Times/Week): 3, Weekly Exercise (Minutes/Week): 60, Intensity: Mild, Exercise limited by: None identified  Goals    . Weight (lb) < 165 lb (74.8 kg)     Pt states she would like to lose a few pounds over the next year      Depression Screen PHQ 2/9 Scores 06/12/2020 06/10/2020  05/02/2020 04/28/2019 04/26/2019 04/10/2019 12/02/2018  PHQ - 2 Score 0 0 0 0 0 0 0  PHQ- 9 Score - 0 2 - - - -    Fall Risk Fall Risk  06/12/2020 06/10/2020 05/02/2020 04/28/2019 04/26/2019  Falls in the past year? 0 0 0 0 0  Number falls in past yr: 0 0 - 0 0  Injury with Fall? 0 0 - 0 0  Risk for fall due to : No Fall Risks - - - -  Follow up Falls prevention discussed Falls evaluation completed Falls evaluation completed - Falls prevention discussed    FALL RISK PREVENTION PERTAINING TO THE HOME:  Any stairs in or around the home? Yes  If so, are there any without handrails? No  Home free of loose throw rugs in walkways, pet beds, electrical cords, etc? Yes  Adequate lighting in your home to reduce risk of falls? Yes   ASSISTIVE DEVICES UTILIZED TO PREVENT FALLS:  Life alert? No  Use of a cane, walker or w/c? No  Grab bars in the bathroom? No  Shower chair or bench in shower? Yes  Elevated toilet seat or a handicapped toilet? Yes   TIMED UP AND GO:  Was the test performed? No . Telephonic visit  Cognitive Function:     6CIT Screen 06/12/2020 04/26/2019 04/12/2018 02/22/2017 04/06/2016  What Year? 0 points 0 points 0 points 0 points 0 points  What month? 0 points 0 points 0 points 0 points 0 points  What time? 0 points 0 points 0 points 0 points 0 points  Count back from 20 0 points 0 points 0 points 0 points 0 points  Months in reverse 0 points 0 points 0 points 0 points 0 points  Repeat phrase 0 points 0 points 0 points 0 points 0 points  Total Score 0 0 0 0 0    Immunizations Immunization History  Administered Date(s) Administered  . Fluad Quad(high Dose 65+) 04/10/2019, 04/08/2020  . Influenza, High Dose Seasonal PF 04/13/2017, 04/12/2018, 04/08/2020  . Influenza,inj,Quad PF,6+ Mos 04/06/2016  . Influenza-Unspecified 02/18/2015  . PFIZER SARS-COV-2 Vaccination 07/17/2019, 08/07/2019, 05/07/2020  . Pneumococcal Conjugate-13 03/20/2014  . Pneumococcal  Polysaccharide-23 07/07/2005  . Tdap 09/26/2015  . Zoster 07/08/2007  . Zoster Recombinat (Shingrix) 06/23/2018, 09/08/2018    TDAP status: Up to date  Flu Vaccine status: Up to date  Pneumococcal vaccine status: Up to date  Covid-19 vaccine status: Completed vaccines  Qualifies for Shingles Vaccine? Yes   Zostavax completed Yes   Shingrix Completed?: Yes  Screening Tests Health Maintenance  Topic Date Due  . COLONOSCOPY  05/02/2021 (Originally 01/14/2019)  . MAMMOGRAM  06/25/2020  . TETANUS/TDAP  09/25/2025  . INFLUENZA VACCINE  Completed  . DEXA SCAN  Completed  . COVID-19 Vaccine  Completed  . PNA vac Low Risk Adult  Completed    Health Maintenance  There are no preventive care reminders to display for this patient.  Colorectal cancer screening: No longer required.   Mammogram status: Completed 06/26/19. Repeat every year. Scheduled 07/15/20.   Bone Density status: Completed 03/04/17. Results reflect: Bone density results: OSTEOPOROSIS. Repeat every 3 years. Pt scheduled for dexa on 06/20/20.   Lung Cancer Screening: (Low Dose CT Chest recommended if Age 75-80 years, 30 pack-year currently smoking OR have quit w/in 15years.) does not qualify.   Additional Screening:  Hepatitis C Screening: does not qualify;   Vision Screening: Recommended annual ophthalmology exams for early detection of glaucoma and other disorders of the eye. Is the patient up to date with their annual eye exam?  Yes  Who is the provider or what is the name of the office in which the patient attends annual eye exams? Dr. Malcolm Metro  Dental Screening: Recommended annual dental exams for proper oral hygiene  Community Resource Referral / Chronic Care Management: CRR required this visit?  No   CCM required this visit?  No      Plan:     I have personally reviewed and noted the following in the patient's chart:   . Medical and social history . Use of alcohol, tobacco or illicit drugs   . Current medications and supplements . Functional ability and status . Nutritional status . Physical activity . Advanced directives . List of other physicians . Hospitalizations, surgeries, and ER visits in previous 12 months . Vitals . Screenings to include cognitive, depression, and falls . Referrals and appointments  In addition, I have reviewed and discussed with patient certain preventive protocols, quality metrics, and best practice recommendations. A written personalized care plan for preventive services as well as general preventive health recommendations were provided to patient.     Clemetine Marker, LPN   [id number]/09/[id number]   Nurse Notes: pt doing well and appreciative of visit today

## 2020-06-20 DIAGNOSIS — M810 Age-related osteoporosis without current pathological fracture: Secondary | ICD-10-CM | POA: Diagnosis not present

## 2020-06-20 DIAGNOSIS — Z20822 Contact with and (suspected) exposure to covid-19: Secondary | ICD-10-CM | POA: Diagnosis not present

## 2020-06-20 DIAGNOSIS — I10 Essential (primary) hypertension: Secondary | ICD-10-CM | POA: Diagnosis not present

## 2020-06-20 DIAGNOSIS — E210 Primary hyperparathyroidism: Secondary | ICD-10-CM | POA: Diagnosis not present

## 2020-06-20 DIAGNOSIS — Z780 Asymptomatic menopausal state: Secondary | ICD-10-CM | POA: Diagnosis not present

## 2020-06-20 DIAGNOSIS — M8589 Other specified disorders of bone density and structure, multiple sites: Secondary | ICD-10-CM | POA: Diagnosis not present

## 2020-06-20 DIAGNOSIS — K219 Gastro-esophageal reflux disease without esophagitis: Secondary | ICD-10-CM | POA: Diagnosis not present

## 2020-06-20 DIAGNOSIS — Z87891 Personal history of nicotine dependence: Secondary | ICD-10-CM | POA: Diagnosis not present

## 2020-06-24 ENCOUNTER — Encounter: Payer: Self-pay | Admitting: Internal Medicine

## 2020-06-24 DIAGNOSIS — D519 Vitamin B12 deficiency anemia, unspecified: Secondary | ICD-10-CM | POA: Insufficient documentation

## 2020-07-15 ENCOUNTER — Other Ambulatory Visit: Payer: Self-pay

## 2020-07-15 ENCOUNTER — Ambulatory Visit
Admission: RE | Admit: 2020-07-15 | Discharge: 2020-07-15 | Disposition: A | Discharge status: Home / Self Care | Payer: Medicare Other | Source: Ambulatory Visit | Attending: Internal Medicine | Admitting: Internal Medicine

## 2020-07-15 DIAGNOSIS — Z1231 Encounter for screening mammogram for malignant neoplasm of breast: Secondary | ICD-10-CM

## 2020-07-19 DIAGNOSIS — E210 Primary hyperparathyroidism: Secondary | ICD-10-CM | POA: Diagnosis not present

## 2020-07-31 DIAGNOSIS — Z79899 Other long term (current) drug therapy: Secondary | ICD-10-CM | POA: Diagnosis not present

## 2020-07-31 DIAGNOSIS — J849 Interstitial pulmonary disease, unspecified: Secondary | ICD-10-CM | POA: Diagnosis not present

## 2020-07-31 DIAGNOSIS — J984 Other disorders of lung: Secondary | ICD-10-CM | POA: Diagnosis not present

## 2020-07-31 DIAGNOSIS — E213 Hyperparathyroidism, unspecified: Secondary | ICD-10-CM | POA: Diagnosis not present

## 2020-07-31 DIAGNOSIS — R918 Other nonspecific abnormal finding of lung field: Secondary | ICD-10-CM | POA: Diagnosis not present

## 2020-08-02 DIAGNOSIS — M19011 Primary osteoarthritis, right shoulder: Secondary | ICD-10-CM | POA: Diagnosis not present

## 2020-08-06 HISTORY — PX: PARATHYROIDECTOMY: SHX19

## 2020-08-08 DIAGNOSIS — Z1152 Encounter for screening for COVID-19: Secondary | ICD-10-CM | POA: Diagnosis not present

## 2020-08-21 DIAGNOSIS — Z885 Allergy status to narcotic agent status: Secondary | ICD-10-CM | POA: Diagnosis not present

## 2020-08-21 DIAGNOSIS — K219 Gastro-esophageal reflux disease without esophagitis: Secondary | ICD-10-CM | POA: Diagnosis not present

## 2020-08-21 DIAGNOSIS — Z880 Allergy status to penicillin: Secondary | ICD-10-CM | POA: Diagnosis not present

## 2020-08-21 DIAGNOSIS — I10 Essential (primary) hypertension: Secondary | ICD-10-CM | POA: Diagnosis not present

## 2020-08-21 DIAGNOSIS — D351 Benign neoplasm of parathyroid gland: Secondary | ICD-10-CM | POA: Diagnosis not present

## 2020-08-21 DIAGNOSIS — Z86718 Personal history of other venous thrombosis and embolism: Secondary | ICD-10-CM | POA: Diagnosis not present

## 2020-08-21 DIAGNOSIS — E210 Primary hyperparathyroidism: Secondary | ICD-10-CM | POA: Diagnosis not present

## 2020-08-21 DIAGNOSIS — Z79899 Other long term (current) drug therapy: Secondary | ICD-10-CM | POA: Diagnosis not present

## 2020-08-21 DIAGNOSIS — G4733 Obstructive sleep apnea (adult) (pediatric): Secondary | ICD-10-CM | POA: Diagnosis not present

## 2020-08-21 DIAGNOSIS — Z780 Asymptomatic menopausal state: Secondary | ICD-10-CM | POA: Diagnosis not present

## 2020-08-21 DIAGNOSIS — Z87891 Personal history of nicotine dependence: Secondary | ICD-10-CM | POA: Diagnosis not present

## 2020-08-21 HISTORY — PX: PARATHYROIDECTOMY: SHX19

## 2020-09-06 DIAGNOSIS — E210 Primary hyperparathyroidism: Secondary | ICD-10-CM | POA: Diagnosis not present

## 2020-10-15 ENCOUNTER — Encounter: Payer: Self-pay | Admitting: Internal Medicine

## 2020-10-15 ENCOUNTER — Other Ambulatory Visit: Payer: Self-pay

## 2020-10-15 ENCOUNTER — Ambulatory Visit (INDEPENDENT_AMBULATORY_CARE_PROVIDER_SITE_OTHER): Payer: Medicare Other | Admitting: Internal Medicine

## 2020-10-15 VITALS — BP 112/74 | HR 72 | Temp 98.0°F | Ht 66.0 in | Wt 177.0 lb

## 2020-10-15 DIAGNOSIS — D518 Other vitamin B12 deficiency anemias: Secondary | ICD-10-CM | POA: Diagnosis not present

## 2020-10-15 DIAGNOSIS — I10 Essential (primary) hypertension: Secondary | ICD-10-CM | POA: Diagnosis not present

## 2020-10-15 DIAGNOSIS — E611 Iron deficiency: Secondary | ICD-10-CM

## 2020-10-15 DIAGNOSIS — E892 Postprocedural hypoparathyroidism: Secondary | ICD-10-CM | POA: Diagnosis not present

## 2020-10-15 DIAGNOSIS — I700 Atherosclerosis of aorta: Secondary | ICD-10-CM | POA: Insufficient documentation

## 2020-10-15 NOTE — Progress Notes (Signed)
Date:  10/15/2020   Name:  Joanna Sanders   DOB:  [birth year]   MRN:  [id number]   Chief Complaint: Hypertension  Hypertension This is a chronic problem. The problem is controlled. Pertinent negatives include no blurred vision, chest pain, headaches, peripheral edema or shortness of breath. Past treatments include diuretics.   Anemia - mild anemia last fall, improved with iron.  Iron supplement was then stopped.  She is due for follow up.  Hyperparathyroidism - recent surgery was successful with normalization of her calcium levels.  She feels much better than pre-surgery.  She has been trying to walk every other day. Appetite is good, no muscle spasms or weakness.  Lab Results  Component Value Date   CREATININE 0.95 05/02/2020   BUN 12 05/02/2020   NA 142 05/02/2020   K 4.3 05/02/2020   CL 106 05/02/2020   CO2 23 05/02/2020   Lab Results  Component Value Date   CHOL 192 05/02/2020   HDL 55 05/02/2020   LDLCALC 112 (H) 05/02/2020   TRIG 143 05/02/2020   CHOLHDL 3.5 05/02/2020   Lab Results  Component Value Date   TSH 1.320 05/02/2020   Lab Results  Component Value Date   HGBA1C 5.8 (H) 04/13/2017   Lab Results  Component Value Date   WBC 7.3 06/10/2020   HGB 12.2 06/10/2020   HCT 35.6 06/10/2020   MCV 92 06/10/2020   PLT 258 06/10/2020   Lab Results  Component Value Date   ALT 16 05/02/2020   AST 16 05/02/2020   ALKPHOS 102 05/02/2020   BILITOT 0.3 05/02/2020     Review of Systems  Constitutional: Negative for chills, fatigue and fever.  Eyes: Negative for blurred vision.  Respiratory: Negative for chest tightness and shortness of breath.   Cardiovascular: Negative for chest pain.  Gastrointestinal: Negative for abdominal pain, constipation and diarrhea.  Musculoskeletal: Positive for arthralgias (right shoulder,much improved since injection).  Neurological: Negative for dizziness and headaches.    Patient Active Problem List   Diagnosis Date  Noted  . Aortic atherosclerosis ([place]) 10/15/2020  . Anemia, vitamin B12 deficiency 06/24/2020  . IPMN (intraductal papillary mucinous neoplasm) 06/06/2018  . Postcholecystectomy diarrhea 06/06/2018  . Plantar fascial fibromatosis 10/12/2017  . Osteoarthritis of knee 10/12/2017  . Thigh numbness 10/12/2017  . OSA (obstructive sleep apnea) 09/18/2016  . Chronic left shoulder pain 06/18/2016  . Abdominal pain, chronic, right upper quadrant 04/29/2016  . 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 Age-related osteoporosis without current pathological fracture 04/06/2016  . Allergic rhinitis 01/24/2015  . Benign essential HTN 01/24/2015  . Hyperlipidemia, mixed 01/24/2015  . Gastroesophageal reflux disease 01/24/2015  . Generalized OA 01/24/2015  . Avitaminosis D 01/24/2015    Allergies  Allergen Reactions  . Nitrofuran Derivatives Diarrhea  . Amoxicillin Itching    Believes that was amoxicillin. Not 932% certain which antibiotic.  Believes that was amoxicillin. Not 355% certain which antibiotic.   Marland Kitchen Hydrochlorothiazide     hypercalcemia  . Prednisone Other (See Comments)    Gets really hyper    Past Surgical History:  Procedure Laterality Date  . CARPAL TUNNEL RELEASE Right   . CATARACT EXTRACTION    . CHOLECYSTECTOMY  2013  . PARATHYROIDECTOMY  08/2020  . TOTAL SHOULDER REPLACEMENT Left 2013    Social History   Tobacco Use  . Smoking status: Former Smoker    Quit date: 07/06/1996    Years since quitting: 24.2  . Smokeless tobacco: Never Used  .  Tobacco comment: smoking cessation materials not required  Vaping Use  . Vaping Use: Never used  Substance Use Topics  . Drug use: No     Medication list has been reviewed and updated.  Current Meds  Medication Sig  . acetaminophen (TYLENOL) 500 MG tablet Take by mouth.  Marland Kitchen alendronate (FOSAMAX) 70 MG tablet Take 1 tablet (70 mg total) by mouth once a week. (Patient taking differently: Take 35 mg by mouth once a week.)  .  Cholecalciferol 25 MCG (1000 UT) capsule Take by mouth.  . colestipol (COLESTID) 1 g tablet Take 1 g by mouth 2 (two) times daily. PRN  . esomeprazole (NEXIUM) 40 MG capsule Take 40 mg by mouth daily at 12 noon.  . furosemide (LASIX) 20 MG tablet Take 1 tablet (20 mg total) by mouth 2 (two) times daily.  . meclizine (ANTIVERT) 12.5 MG tablet Take 1 tablet (12.5 mg total) by mouth 3 (three) times daily as needed for dizziness.  . meloxicam (MOBIC) 15 MG tablet Take 1 tablet by mouth daily.  . ondansetron (ZOFRAN) 4 MG tablet Take 1 tablet (4 mg total) by mouth every 8 (eight) hours as needed for nausea or vomiting.  Vladimir Faster Glycol-Propyl Glycol 0.4-0.3 % SOLN Apply to eye.    PHQ 2/9 Scores 10/15/2020 06/12/2020 06/10/2020 05/02/2020  PHQ - 2 Score 0 0 0 0  PHQ- 9 Score 0 - 0 2    GAD 7 : Generalized Anxiety Score 10/15/2020 06/10/2020 05/02/2020  Nervous, Anxious, on Edge 0 0 0  Control/stop worrying 0 0 0  Worry too much - different things 0 0 0  Trouble relaxing 0 0 0  Restless 0 0 0  Easily annoyed or irritable 0 0 0  Afraid - awful might happen 0 0 0  Total GAD 7 Score 0 0 0  Anxiety Difficulty - Not difficult at all Not difficult at all    BP Readings from Last 3 Encounters:  10/15/20 112/74  06/10/20 132/70  05/02/20 136/84    Physical Exam Vitals and nursing note reviewed.  Constitutional:      General: She is not in acute distress.    Appearance: Normal appearance. She is well-developed.  HENT:     Head: Normocephalic and atraumatic.  Cardiovascular:     Rate and Rhythm: Normal rate and regular rhythm.     Pulses: Normal pulses.     Heart sounds: No murmur heard.   Pulmonary:     Effort: Pulmonary effort is normal. No respiratory distress.     Breath sounds: No wheezing or rhonchi.  Musculoskeletal:     Right lower leg: No edema.     Left lower leg: No edema.  Lymphadenopathy:     Cervical: No cervical adenopathy.  Skin:    General: Skin is warm and dry.      Findings: No rash.  Neurological:     General: No focal deficit present.     Mental Status: She is alert and oriented to person, place, and time.  Psychiatric:        Mood and Affect: Mood normal.        Behavior: Behavior normal.     Wt Readings from Last 3 Encounters:  10/15/20 177 lb (80.3 kg)  06/10/20 169 lb (76.7 kg)  05/02/20 171 lb (77.6 kg)    BP 112/74   Pulse 72   Temp 98 F (36.7 C) (Oral)   Ht 5\' 6"  (1.676 m)   Wt 177  lb (80.3 kg)   SpO2 98%   BMI 28.57 kg/m   Assessment and Plan: 1. Benign essential HTN Clinically stable exam with well controlled BP. Tolerating medications without side effects at this time. Pt to continue current regimen and low sodium diet; benefits of regular exercise as able discussed. - CBC with Differential/Platelet - Basic metabolic panel  2. Iron deficiency Recheck labs today and advise. - Iron, TIBC and Ferritin Panel  3. Aortic atherosclerosis ([facility]) On bile acid sequestrants  4. Other vitamin B12 deficiency anemia Last levels were normal.  5. S/P parathyroidectomy ([place]) Healed and doing well - release from surgery follow up Being followed by endocrinology   Partially dictated using Dragon software. Any errors are unintentional.  Halina Maidens, MD [facility]  10/15/2020

## 2020-10-16 LAB — IRON,TIBC AND FERRITIN PANEL
Ferritin: 25 ng/mL (ref 15–150)
Iron Saturation: 17 % (ref 15–55)
Iron: 60 ug/dL (ref 27–139)
Total Iron Binding Capacity: 357 ug/dL (ref 250–450)
UIBC: 297 ug/dL (ref 118–369)

## 2020-10-16 LAB — CBC WITH DIFFERENTIAL/PLATELET
Basophils Absolute: 0 10*3/uL (ref 0.0–0.2)
Basos: 1 %
EOS (ABSOLUTE): 0.1 10*3/uL (ref 0.0–0.4)
Eos: 2 %
Hematocrit: 33 % — ABNORMAL LOW (ref 34.0–46.6)
Hemoglobin: 11.2 g/dL (ref 11.1–15.9)
Immature Grans (Abs): 0 10*3/uL (ref 0.0–0.1)
Immature Granulocytes: 1 %
Lymphocytes Absolute: 1.8 10*3/uL (ref 0.7–3.1)
Lymphs: 28 %
MCH: 31.1 pg (ref 26.6–33.0)
MCHC: 33.9 g/dL (ref 31.5–35.7)
MCV: 92 fL (ref 79–97)
Monocytes Absolute: 0.7 10*3/uL (ref 0.1–0.9)
Monocytes: 12 %
Neutrophils Absolute: 3.7 10*3/uL (ref 1.4–7.0)
Neutrophils: 56 %
Platelets: 247 10*3/uL (ref 150–450)
RBC: 3.6 x10E6/uL — ABNORMAL LOW (ref 3.77–5.28)
RDW: 12.3 % (ref 11.7–15.4)
WBC: 6.3 10*3/uL (ref 3.4–10.8)

## 2020-10-16 LAB — BASIC METABOLIC PANEL
BUN/Creatinine Ratio: 18 (ref 12–28)
BUN: 19 mg/dL (ref 8–27)
CO2: 22 mmol/L (ref 20–29)
Calcium: 9.2 mg/dL (ref 8.7–10.3)
Chloride: 100 mmol/L (ref 96–106)
Creatinine, Ser: 1.05 mg/dL — ABNORMAL HIGH (ref 0.57–1.00)
Glucose: 108 mg/dL — ABNORMAL HIGH (ref 65–99)
Potassium: 4.3 mmol/L (ref 3.5–5.2)
Sodium: 138 mmol/L (ref 134–144)
eGFR: 51 mL/min/{1.73_m2} — ABNORMAL LOW (ref >59)

## 2020-10-19 DIAGNOSIS — U071 COVID-19: Secondary | ICD-10-CM

## 2020-10-19 HISTORY — DX: COVID-19: U07.1

## 2020-10-21 ENCOUNTER — Telehealth: Payer: Self-pay

## 2020-10-21 NOTE — Telephone Encounter (Signed)
Copied from [place] [id number]. Topic: General - Other >> Oct 21, 2020 10:33 AM Tessa Lerner A wrote: Reason for CRM: Patient would like to be contacted by staff member "Chassity" when available  Patient has concerns and questions regarding recent labwork and results  Please contact to further advise when possible

## 2020-10-21 NOTE — Telephone Encounter (Signed)
Called pt viewed labs with her. Pt verbalized understanding. Pt also tested positive for covid. She is taking tylenol for pain and getting plenty of rest as well as using cough drops. Told pt to also drink plenty of fluids and to call if she feels worse. Pt stated she feels like she is doing better than she was. Pt verbalized understanding.  KP

## 2020-11-01 ENCOUNTER — Telehealth (INDEPENDENT_AMBULATORY_CARE_PROVIDER_SITE_OTHER): Payer: Medicare Other | Admitting: Family Medicine

## 2020-11-01 ENCOUNTER — Other Ambulatory Visit: Payer: Self-pay

## 2020-11-01 ENCOUNTER — Ambulatory Visit: Payer: Self-pay | Admitting: *Deleted

## 2020-11-01 ENCOUNTER — Telehealth: Payer: Self-pay

## 2020-11-01 ENCOUNTER — Encounter: Payer: Self-pay | Admitting: Family Medicine

## 2020-11-01 VITALS — BP 127/79 | HR 77 | Temp 99.7°F | Ht 66.0 in | Wt 172.0 lb

## 2020-11-01 DIAGNOSIS — B9689 Other specified bacterial agents as the cause of diseases classified elsewhere: Secondary | ICD-10-CM | POA: Insufficient documentation

## 2020-11-01 DIAGNOSIS — J019 Acute sinusitis, unspecified: Secondary | ICD-10-CM | POA: Diagnosis not present

## 2020-11-01 MED ORDER — DOXYCYCLINE HYCLATE 100 MG PO TABS: 100.0000 mg | ORAL_TABLET | Freq: Two times a day (BID) | ORAL | 0 refills | Status: DC

## 2020-11-01 MED ORDER — FLUTICASONE PROPIONATE 50 MCG/ACT NA SUSP: 1.0000 | Freq: Every day | NASAL | 0 refills | Status: DC

## 2020-11-01 NOTE — Telephone Encounter (Signed)
I discussed the limitations, risks, security and privacy concerns of performing an evaluation and management service by WebEx/MyChart/Doximity Video, including the higher likelihood of inaccurate diagnosis and treatment, and the availability of in person appointments. We also discussed the likely need of an additional face to face encounter for complete and high quality delivery of care. I also discussed with the patient that there may be a patient responsible charge related to this service. The patient expressed understanding and wishes to proceed.

## 2020-11-01 NOTE — Telephone Encounter (Signed)
I returned pt's call.   She got a [place] test [place]id [place].   Sister had covid too.   She got over the symptoms of covid and was doing better.   This past Tuesday 10/29/2020 she developed a runny nose, irritated throat, productive cough with thin very light colored mucus, post nasal drip and fever she has had for 3 days now.   She is also c/o pressure in her frontal sinuses.   "I feel a little worse today so thought I better call in".   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    Covid questionnaire completed and indicates a virtual visit.   She is agreeable to this but does Facetime not the MyChart virtual visit. There are no appts available today with Dr. Army Melia so I have sent her information to [facility] for an appt.  I let pt know someone would call her back.   Please call her on her home phone [phone].   If it is decided to do a Facetime call her cell is [phone].  I sent my notes to [facility] high priority.   They are closed for lunch presently. Reason for Disposition . Fever present > 3 days (72 hours)  Answer Assessment - Initial Assessment Questions 1. COVID-19 DIAGNOSIS: "Who made your COVID-19 diagnosis?" "Was it confirmed by a positive lab test or self-test?" If not diagnosed by a doctor (or NP/PA), ask "Are there lots of cases (community spread) where you live?" Note: See public health department website, if unsure.     I got over covid.  I had it while at the [facility] 10/19/2020.  This started Tuesday.    Now I have runny nose, low grade, and headache.  I have pressure in frontal sinuses.  had a little diarrhea. Fever 99.4 and 99.7.     She is coughing up thin mucus.   Not green or thick.   Today it's worse and I don't feel good.   My throat is irritated with post nasal drip.   2. COVID-19 EXPOSURE: "Was there any known exposure to COVID before the symptoms began?" CDC  Definition of close contact: within 6 feet (2 meters) for a total of 15 minutes or more over a 24-hour period.      *No Answer* 3. ONSET: "When did the COVID-19 symptoms start?"      *No Answer* 4. WORST SYMPTOM: "What is your worst symptom?" (e.g., cough, fever, shortness of breath, muscle aches)     *No Answer* 5. COUGH: "Do you have a cough?" If Yes, ask: "How bad is the cough?"       *No Answer* 6. FEVER: "Do you have a fever?" If Yes, ask: "What is your temperature, how was it measured, and when did it start?"     *No Answer* 7. RESPIRATORY STATUS: "Describe your breathing?" (e.g., shortness of breath, wheezing, unable to speak)      *No Answer* 8. BETTER-SAME-WORSE: "Are you getting better, staying the same or getting worse compared to yesterday?"  If getting worse, ask, "In what way?"     *No Answer* 9. HIGH RISK DISEASE: "Do you have any chronic medical problems?" (e.g., asthma, heart or lung disease, weak immune system, obesity, etc.)     *No Answer* 10. VACCINE: "Have you had the COVID-19 vaccine?" If Yes, ask: "Which one, how many shots, when did you get it?"       *  No Answer* 11. BOOSTER: "Have you received your COVID-19 booster?" If Yes, ask: "Which one and when did you get it?"       *No Answer* 12. PREGNANCY: "Is there any chance you are pregnant?" "When was your last menstrual period?"       *No Answer* 13. OTHER SYMPTOMS: "Do you have any other symptoms?"  (e.g., chills, fatigue, headache, loss of smell or taste, muscle pain, sore throat)       *No Answer* 14. O2 SATURATION MONITOR:  "Do you use an oxygen saturation monitor (pulse oximeter) at home?" If Yes, ask "What is your reading (oxygen level) today?" "What is your usual oxygen saturation reading?" (e.g., 95%)       *No Answer*  Protocols used: CORONAVIRUS (COVID-19) DIAGNOSED OR SUSPECTED-A-AH

## 2020-11-01 NOTE — Assessment & Plan Note (Signed)
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 Additionally relaying subjective fevers and facial pain/pressure. Upon questioning she has diminished sense of smell and ear fullness but denies chills, chest pain, shortness of air, abdominal pain, nausea, emesis, urinary or bowel change. Of note she had COVID diagnosed on 10/19/20 and felt that she had fully recovered prior to these new symptoms.  I did have patient percuss her sinuses and she had prominent tenderness left greater than right "under eye" regions near her nose / cheeks raising concern for ethmoid / maxillary involvement.   Given duration of symptoms, inclusive of discolored nasal discharge, facial pain/fullness, diminished smell, ear fullness, cough, and consecutive days of symptoms her symptoms raise concern for acute bacterial rhinosinusitis. I cannot exclude a superimposed allergic component so treatment was outlined to include scheduled intranasal steroids, cetirizine, free water intake, intranasal saline, and for patient to start doxycycline given her allergy profile x 5 days over the weekend if symptoms do not respond to allergy treatment alone. I have also advised her to follow-up with her PCP Dr. Army Melia within 5 days.

## 2020-11-01 NOTE — Progress Notes (Addendum)
Primary Care / Sports Medicine Virtual Visit  Patient Information:  Patient ID: Joanna Sanders, female DOB: [birth year] Age: 87 y.o. MRN: [id number]   Joanna Sanders is a pleasant 87 y.o. female presenting with the following:  Chief Complaint  Patient presents with  . Nasal Congestion    runny nose, headache; irritated throat, productive cough with thin very light colored mucus, post nasal drip and fever for 3 days; 8/10 pain  . Covid Positive    10/19/2020    Review of Systems: No fevers, chills, night sweats, weight loss, chest pain, or shortness of breath.   Patient Active Problem List   Diagnosis Date Noted  . Acute bacterial rhinosinusitis 11/01/2020  . Aortic atherosclerosis ([place]) 10/15/2020  . S/P parathyroidectomy ([place]) 10/15/2020  . Anemia, vitamin B12 deficiency 06/24/2020  . IPMN (intraductal papillary mucinous neoplasm) 06/06/2018  . Postcholecystectomy diarrhea 06/06/2018  . Plantar fascial fibromatosis 10/12/2017  . Osteoarthritis of knee 10/12/2017  . Thigh numbness 10/12/2017  . OSA (obstructive sleep apnea) 09/18/2016  . Chronic left shoulder pain 06/18/2016  . Abdominal pain, chronic, right upper quadrant 04/29/2016  . 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 Age-related osteoporosis without current pathological fracture 04/06/2016  . Allergic rhinitis 01/24/2015  . Benign essential HTN 01/24/2015  . Hyperlipidemia, mixed 01/24/2015  . Gastroesophageal reflux disease 01/24/2015  . Generalized OA 01/24/2015  . Avitaminosis D 01/24/2015   Past Medical History:  Diagnosis Date  . COVID-19 10/19/2020  . GERD (gastroesophageal reflux disease)   . Hyperlipidemia   . Hypertension   . Knee joint effusion 10/12/2017  . Sleep apnea    Outpatient Medications Prior to Visit  Medication Sig Dispense Refill  . acetaminophen (TYLENOL) 500 MG tablet Take by mouth.    . Calcium Carbonate-Vit D-Min (CALTRATE 600+D PLUS PO) Take 2 tablets by mouth daily at 2 PM.     . Cholecalciferol 25 MCG (1000 UT) capsule Take by mouth.    . colestipol (COLESTID) 1 g tablet Take 1 g by mouth 2 (two) times daily. PRN    . esomeprazole (NEXIUM) 40 MG capsule Take 40 mg by mouth daily at 12 noon.    . furosemide (LASIX) 20 MG tablet Take 1 tablet (20 mg total) by mouth 2 (two) times daily. 180 tablet 3  . meclizine (ANTIVERT) 12.5 MG tablet Take 1 tablet (12.5 mg total) by mouth 3 (three) times daily as needed for dizziness. 30 tablet 0  . meloxicam (MOBIC) 15 MG tablet Take 1 tablet by mouth daily.    . ondansetron (ZOFRAN) 4 MG tablet Take 1 tablet (4 mg total) by mouth every 8 (eight) hours as needed for nausea or vomiting. 20 tablet 0  . Polyethyl Glycol-Propyl Glycol 0.4-0.3 % SOLN Apply to eye.    Marland Kitchen alendronate (FOSAMAX) 70 MG tablet Take 1 tablet (70 mg total) by mouth once a week. (Patient not taking: Reported on 11/01/2020) 90 tablet 3   No facility-administered medications prior to visit.    Past Surgical History:  Procedure Laterality Date  . CARPAL TUNNEL RELEASE Right   . CATARACT EXTRACTION    . CHOLECYSTECTOMY  2013  . PARATHYROIDECTOMY  08/21/2020  . TOTAL SHOULDER REPLACEMENT Left 2013   Social History   Socioeconomic History  . Marital status: Widowed    Spouse name: Not on file  . Number of children: Not on file  . Years of education: Not on file  . Highest education level: Not on file  Occupational  History  . Occupation: Retired  Tobacco Use  . Smoking status: Former Smoker    Quit date: 07/06/1996    Years since quitting: 24.3  . Smokeless tobacco: Never Used  Vaping Use  . Vaping Use: Never used  Substance and Sexual Activity  . Alcohol use: Yes    Alcohol/week: 3.0 standard drinks    Types: 3 Glasses of wine per week  . Drug use: Never  . Sexual activity: Not Currently  Other Topics Concern  . Not on file  Social History Narrative   Pt lives alone   Social Determinants of Health   Financial Resource Strain: Low Risk   .  Difficulty of Paying Living Expenses: Not hard at all  Food Insecurity: No Food Insecurity  . Worried About Charity fundraiser in the Last Year: Never true  . Ran Out of Food in the Last Year: Never true  Transportation Needs: No Transportation Needs  . Lack of Transportation (Medical): No  . Lack of Transportation (Non-Medical): No  Physical Activity: Insufficiently Active  . Days of Exercise per Week: 3 days  . Minutes of Exercise per Session: 20 min  Stress: No Stress Concern Present  . Feeling of Stress : Not at all  Social Connections: Moderately Integrated  . Frequency of Communication with Friends and Family: More than three times a week  . Frequency of Social Gatherings with Friends and Family: More than three times a week  . Attends Religious Services: More than 4 times per year  . Active Member of Clubs or Organizations: Yes  . Attends Archivist Meetings: More than 4 times per year  . Marital Status: Widowed  Intimate Partner Violence: Not At Risk  . Fear of Current or Ex-Partner: No  . Emotionally Abused: No  . Physically Abused: No  . Sexually Abused: No   Family History  Problem Relation Age of Onset  . Hypertension Mother   . Leukemia Mother   . Heart failure Father   . Breast cancer Daughter 21   Allergies  Allergen Reactions  . Nitrofuran Derivatives Diarrhea  . Amoxicillin Itching    Believes that was amoxicillin. Not 993% certain which antibiotic.    Marland Kitchen Hydrochlorothiazide     hypercalcemia  . Prednisone Other (See Comments)    Gets really hyper    Virtual Visit via MyChart Video:   I connected with@ on 11/01/20 via MyChart for a telephone visit and verified that I am speaking with the correct person using appropriate identifiers.   The limitations, risks, security and privacy concerns of performing an evaluation and management service over telephone, including the higher likelihood of inaccurate diagnoses and treatments, and the  availability of in person appointments were reviewed. The possible need of an additional face-to-face encounter for complete and high quality delivery of care was discussed. The patient was also made aware that there may be a patient responsible charge related to this service. The patient expressed understanding and wishes to proceed.  Provider location is in medical facility. Patient location is at their home, different from provider location. People involved in care of the patient during this telehealth encounter were myself, my nurse/medical assistant, and my front office/scheduling team member.  Objective findings:   General: Speaking full sentences, slightly hoarse voice, no audible heavy breathing. Sounds alert and appropriately interactive.  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 Additionally relaying subjective fevers and facial pain/pressure. Upon questioning she has diminished sense of smell and ear fullness but denies chills, chest pain, shortness of air, abdominal pain, nausea, emesis, urinary or bowel change. Of note she had COVID diagnosed on 10/19/20 and felt that she had fully recovered prior to these new symptoms.  I did have patient percuss her sinuses and she had prominent tenderness left greater than right "under eye" regions near her nose / cheeks raising concern for ethmoid / maxillary involvement.   Given duration of symptoms, inclusive of discolored nasal discharge, facial pain/fullness, diminished smell, ear fullness, cough, and consecutive days of symptoms her symptoms raise concern for acute bacterial rhinosinusitis. I cannot exclude a superimposed allergic component so treatment was outlined to include scheduled  intranasal steroids, cetirizine, free water intake, intranasal saline, and for patient to start doxycycline given her allergy profile x 5 days over the weekend if symptoms do not respond to allergy treatment alone. I have also advised her to follow-up with her PCP Dr. Army Melia within 5 days.    Orders & Medications Meds ordered this encounter  Medications  . fluticasone (FLONASE) 50 MCG/ACT nasal spray    Sig: Place 1 spray into both nostrils daily.    Dispense:  16 g    Refill:  0  . doxycycline (VIBRA-TABS) 100 MG tablet    Sig: Take 1 tablet (100 mg total) by mouth 2 (two) times daily.    Dispense:  10 tablet    Refill:  0   No orders of the defined types were placed in this encounter.   I discussed the above assessment and treatment plan with the patient. The patient was provided an opportunity to ask questions and all were answered. The patient agreed with the plan and demonstrated an understanding of the instructions.   The patient was advised to call back or seek an in-person evaluation if the symptoms worsen or if the condition fails to improve as anticipated.   I provided a total time of 30 minutes on 11/01/2020 inclusive of time utilized for medical chart review, information gathering, care coordination with staff, and documentation completion.    Montel Culver, MD   Primary Care Sports Medicine [place]

## 2020-11-05 ENCOUNTER — Encounter: Payer: Self-pay | Admitting: Internal Medicine

## 2020-11-05 ENCOUNTER — Ambulatory Visit
Admission: RE | Admit: 2020-11-05 | Discharge: 2020-11-05 | Disposition: A | Discharge status: Home / Self Care | Payer: Medicare Other | Source: Ambulatory Visit | Attending: Internal Medicine | Admitting: Internal Medicine

## 2020-11-05 ENCOUNTER — Ambulatory Visit (INDEPENDENT_AMBULATORY_CARE_PROVIDER_SITE_OTHER): Payer: Medicare Other | Admitting: Internal Medicine

## 2020-11-05 ENCOUNTER — Other Ambulatory Visit: Payer: Self-pay

## 2020-11-05 ENCOUNTER — Ambulatory Visit
Admission: RE | Admit: 2020-11-05 | Discharge: 2020-11-05 | Disposition: A | Discharge status: Home / Self Care | Payer: Medicare Other | Attending: Internal Medicine | Admitting: Internal Medicine

## 2020-11-05 VITALS — BP 128/80 | HR 77 | Temp 98.1°F | Ht 66.0 in | Wt 174.0 lb

## 2020-11-05 DIAGNOSIS — J0100 Acute maxillary sinusitis, unspecified: Secondary | ICD-10-CM

## 2020-11-05 DIAGNOSIS — J1282 Pneumonia due to coronavirus disease 2019: Secondary | ICD-10-CM

## 2020-11-05 DIAGNOSIS — U071 COVID-19: Secondary | ICD-10-CM | POA: Insufficient documentation

## 2020-11-05 DIAGNOSIS — R059 Cough, unspecified: Secondary | ICD-10-CM | POA: Diagnosis not present

## 2020-11-05 DIAGNOSIS — R0602 Shortness of breath: Secondary | ICD-10-CM | POA: Diagnosis not present

## 2020-11-05 NOTE — Progress Notes (Signed)
Date:  11/05/2020   Name:  Joanna Sanders   DOB:  [birth year]   MRN:  [id number]   Chief Complaint: follow up Covid (Low grade fever Friday to Sunday. Now better. Drainage, cough- clear mucous. Tested POS for Covid 04/16th)  Cough This is a new problem. The current episode started 1 to 4 weeks ago. The problem has been gradually improving. The problem occurs every few minutes. The cough is non-productive (clear mucus). Associated symptoms include nasal congestion, postnasal drip and shortness of breath. Pertinent negatives include no chest pain, chills, ear congestion, fever (resolved), headaches, weight loss or wheezing. The symptoms are aggravated by exercise. She has tried a beta-agonist inhaler (and 5 days of antibiotics) for the symptoms.  Sinus Problem This is a new problem. The current episode started in the past 7 days. There has been no fever. Associated symptoms include congestion, coughing, shortness of breath and sinus pressure. Pertinent negatives include no chills, diaphoresis, headaches or hoarse voice. Past treatments include antibiotics.    Lab Results  Component Value Date   CREATININE 1.05 (H) 10/15/2020   BUN 19 10/15/2020   NA 138 10/15/2020   K 4.3 10/15/2020   CL 100 10/15/2020   CO2 22 10/15/2020   Lab Results  Component Value Date   CHOL 192 05/02/2020   HDL 55 05/02/2020   LDLCALC 112 (H) 05/02/2020   TRIG 143 05/02/2020   CHOLHDL 3.5 05/02/2020   Lab Results  Component Value Date   TSH 1.320 05/02/2020   Lab Results  Component Value Date   HGBA1C 5.8 (H) 04/13/2017   Lab Results  Component Value Date   WBC 6.3 10/15/2020   HGB 11.2 10/15/2020   HCT 33.0 (L) 10/15/2020   MCV 92 10/15/2020   PLT 247 10/15/2020   Lab Results  Component Value Date   ALT 16 05/02/2020   AST 16 05/02/2020   ALKPHOS 102 05/02/2020   BILITOT 0.3 05/02/2020     Review of Systems  Constitutional: Positive for appetite change and fatigue. Negative for  chills, diaphoresis, fever (resolved) and weight loss.  HENT: Positive for congestion, postnasal drip and sinus pressure. Negative for hoarse voice.   Respiratory: Positive for cough and shortness of breath. Negative for wheezing.   Cardiovascular: Negative for chest pain and palpitations.  Gastrointestinal: Negative for abdominal pain, diarrhea, nausea and vomiting.  Neurological: Negative for dizziness, light-headedness and headaches.    Patient Active Problem List   Diagnosis Date Noted  . Acute bacterial rhinosinusitis 11/01/2020  . Aortic atherosclerosis ([place]) 10/15/2020  . S/P parathyroidectomy ([place]) 10/15/2020  . Anemia, vitamin B12 deficiency 06/24/2020  . IPMN (intraductal papillary mucinous neoplasm) 06/06/2018  . Postcholecystectomy diarrhea 06/06/2018  . Plantar fascial fibromatosis 10/12/2017  . Osteoarthritis of knee 10/12/2017  . Thigh numbness 10/12/2017  . OSA (obstructive sleep apnea) 09/18/2016  . Chronic left shoulder pain 06/18/2016  . Abdominal pain, chronic, right upper quadrant 04/29/2016  . 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 Age-related osteoporosis without current pathological fracture 04/06/2016  . Allergic rhinitis 01/24/2015  . Benign essential HTN 01/24/2015  . Hyperlipidemia, mixed 01/24/2015  . Gastroesophageal reflux disease 01/24/2015  . Generalized OA 01/24/2015  . Avitaminosis D 01/24/2015    Allergies  Allergen Reactions  . Nitrofuran Derivatives Diarrhea  . Amoxicillin Itching    Believes that was amoxicillin. Not 767% certain which antibiotic.    Marland Kitchen Hydrochlorothiazide     hypercalcemia  . Prednisone Other (See Comments)    Gets  really hyper    Past Surgical History:  Procedure Laterality Date  . CARPAL TUNNEL RELEASE Right   . CATARACT EXTRACTION    . CHOLECYSTECTOMY  2013  . PARATHYROIDECTOMY  08/21/2020  . TOTAL SHOULDER REPLACEMENT Left 2013    Social History   Tobacco Use  . Smoking status: Former Smoker    Quit date:  07/06/1996    Years since quitting: 24.3  . Smokeless tobacco: Never Used  Vaping Use  . Vaping Use: Never used  Substance Use Topics  . Alcohol use: Yes    Alcohol/week: 3.0 standard drinks    Types: 3 Glasses of wine per week  . Drug use: Never     Medication list has been reviewed and updated.  Current Meds  Medication Sig  . acetaminophen (TYLENOL) 500 MG tablet Take by mouth.  Marland Kitchen alendronate (FOSAMAX) 70 MG tablet Take 1 tablet (70 mg total) by mouth once a week.  . Calcium Carbonate-Vit D-Min (CALTRATE 600+D PLUS PO) Take 2 tablets by mouth daily at 2 PM.  . Cholecalciferol 25 MCG (1000 UT) capsule Take by mouth.  . colestipol (COLESTID) 1 g tablet Take 1 g by mouth 2 (two) times daily. PRN  . doxycycline (VIBRA-TABS) 100 MG tablet Take 1 tablet (100 mg total) by mouth 2 (two) times daily.  Marland Kitchen esomeprazole (NEXIUM) 40 MG capsule Take 40 mg by mouth daily at 12 noon.  . fluticasone (FLONASE) 50 MCG/ACT nasal spray Place 1 spray into both nostrils daily.  . furosemide (LASIX) 20 MG tablet Take 1 tablet (20 mg total) by mouth 2 (two) times daily.  . meclizine (ANTIVERT) 12.5 MG tablet Take 1 tablet (12.5 mg total) by mouth 3 (three) times daily as needed for dizziness.  . meloxicam (MOBIC) 15 MG tablet Take 1 tablet by mouth daily.  . ondansetron (ZOFRAN) 4 MG tablet Take 1 tablet (4 mg total) by mouth every 8 (eight) hours as needed for nausea or vomiting.  Vladimir Faster Glycol-Propyl Glycol 0.4-0.3 % SOLN Apply to eye.    PHQ 2/9 Scores 11/05/2020 10/15/2020 06/12/2020 06/10/2020  PHQ - 2 Score 0 0 0 0  PHQ- 9 Score 1 0 - 0    GAD 7 : Generalized Anxiety Score 11/05/2020 10/15/2020 06/10/2020 05/02/2020  Nervous, Anxious, on Edge 0 0 0 0  Control/stop worrying 0 0 0 0  Worry too much - different things 0 0 0 0  Trouble relaxing 0 0 0 0  Restless 0 0 0 0  Easily annoyed or irritable 0 0 0 0  Afraid - awful might happen 0 0 0 0  Total GAD 7 Score 0 0 0 0  Anxiety Difficulty Not  difficult at all - Not difficult at all Not difficult at all    BP Readings from Last 3 Encounters:  11/05/20 128/80  11/01/20 127/79  10/15/20 112/74    Physical Exam Vitals and nursing note reviewed.  Constitutional:      General: She is not in acute distress.    Appearance: Normal appearance. She is well-developed.  HENT:     Head: Normocephalic and atraumatic.     Nose:     Right Sinus: Maxillary sinus tenderness present.     Left Sinus: Maxillary sinus tenderness present.     Mouth/Throat:     Mouth: Mucous membranes are moist.     Pharynx: No posterior oropharyngeal erythema.  Cardiovascular:     Rate and Rhythm: Normal rate and regular rhythm.  Pulses: Normal pulses.     Heart sounds: No murmur heard.   Pulmonary:     Effort: Pulmonary effort is normal. No respiratory distress.     Breath sounds: Examination of the right-upper field reveals rhonchi. Rhonchi present. No wheezing.  Musculoskeletal:     Cervical back: Normal range of motion.  Lymphadenopathy:     Cervical: No cervical adenopathy.  Skin:    General: Skin is warm and dry.     Findings: No rash.  Neurological:     Mental Status: She is alert and oriented to person, place, and time.  Psychiatric:        Mood and Affect: Mood normal.        Behavior: Behavior normal.     Wt Readings from Last 3 Encounters:  11/05/20 174 lb (78.9 kg)  11/01/20 172 lb (78 kg)  10/15/20 177 lb (80.3 kg)    BP 128/80   Pulse 77   Temp 98.1 F (36.7 C) (Oral)   Ht 5\' 6"  (1.676 m)   Wt 174 lb (78.9 kg)   SpO2 97%   BMI 28.08 kg/m   Assessment and Plan: 1. Pneumonia due to COVID-19 virus Abnormal lung sounds on the right suggest pulmonary process Will get CXR Continue Tylenol, rest, fluids Will advise on additional Doxycycline - DG Chest 2 View; Future  2. Acute non-recurrent maxillary sinusitis Symptoms improving Continue Flonase   Partially dictated using Editor, commissioning. Any errors are  unintentional.  Halina Maidens, MD [facility]  11/05/2020

## 2020-11-06 ENCOUNTER — Telehealth: Payer: Self-pay

## 2020-11-06 NOTE — Telephone Encounter (Signed)
Patient chest Xray result has not returned yet. Once it returns, we will give her a call with results.

## 2020-11-06 NOTE — Telephone Encounter (Signed)
Copied from [place] [phone]. Topic: Quick Communication - Other Results (Clinic Use ONLY) >> Nov 06, 2020 11:03 AM Lennox Solders wrote: Pt is calling and would like chest xray results from yesterday

## 2020-11-18 DIAGNOSIS — Z961 Presence of intraocular lens: Secondary | ICD-10-CM | POA: Diagnosis not present

## 2020-11-18 DIAGNOSIS — H519 Unspecified disorder of binocular movement: Secondary | ICD-10-CM | POA: Diagnosis not present

## 2020-11-18 DIAGNOSIS — H532 Diplopia: Secondary | ICD-10-CM | POA: Diagnosis not present

## 2020-12-06 ENCOUNTER — Telehealth: Payer: Self-pay

## 2020-12-06 NOTE — Telephone Encounter (Signed)
Copied from [place] [id number]. Topic: General - Other >> Dec 06, 2020  2:35 PM Pawlus, Brayton Layman A wrote: Reason for CRM: Pt wanted to speak with Chassidy to update her from a report she received from her insurance. Pt had a few questions and requested a call back.

## 2020-12-09 NOTE — Telephone Encounter (Signed)
Patient said she had a wellness nurse from her insurance company visit her home to check up on her. And on their chart its listed that she has CHF.   She wanted to make sure her chart does not say she has this. Told her this is not listed in her problem list and she verbalized understanding. Told her we will see her in 4 months at her CPE 10/28 at 1040 AM.

## 2020-12-26 DIAGNOSIS — E892 Postprocedural hypoparathyroidism: Secondary | ICD-10-CM | POA: Diagnosis not present

## 2020-12-26 DIAGNOSIS — M810 Age-related osteoporosis without current pathological fracture: Secondary | ICD-10-CM | POA: Diagnosis not present

## 2021-02-04 DIAGNOSIS — L570 Actinic keratosis: Secondary | ICD-10-CM | POA: Diagnosis not present

## 2021-02-04 DIAGNOSIS — B353 Tinea pedis: Secondary | ICD-10-CM | POA: Diagnosis not present

## 2021-02-04 DIAGNOSIS — Z872 Personal history of diseases of the skin and subcutaneous tissue: Secondary | ICD-10-CM | POA: Diagnosis not present

## 2021-02-04 DIAGNOSIS — L821 Other seborrheic keratosis: Secondary | ICD-10-CM | POA: Diagnosis not present

## 2021-02-04 DIAGNOSIS — L578 Other skin changes due to chronic exposure to nonionizing radiation: Secondary | ICD-10-CM | POA: Diagnosis not present

## 2021-02-04 DIAGNOSIS — L298 Other pruritus: Secondary | ICD-10-CM | POA: Diagnosis not present

## 2021-02-13 ENCOUNTER — Telehealth: Payer: Self-pay

## 2021-02-13 NOTE — Telephone Encounter (Signed)
Called pt let her know that if she was exposed to COVID-19 and is up to date on COVID-19 vaccinations she does not have to quarantine And she also does not need to stay home unless she develop symptoms. Also that she can get tested even if she does not develop symptoms to  get tested at least 5 days after she last had close contact with someone with COVID-19. Watch for symptoms until 10 days after she last had close contact with someone with COVID-19. If she develops symptoms isolate immediately and get tested. Continue to stay home until she knows the results. Wear a well-fitting mask around others. ( Per CDC) Pt verbalized understanding.  KP

## 2021-02-13 NOTE — Telephone Encounter (Signed)
Copied from [place] [phone]. Topic: General - Other >> Feb 12, 2021  3:00 PM Yvette Rack wrote: Reason for CRM: Pt requests that [place] return her call regarding conflicting information she received about exposure to covid. Cb# [phone]

## 2021-03-08 IMAGING — MG DIGITAL SCREENING BILAT W/ TOMO W/ CAD
8 series · 8 of 24 positions shown · non-contrast
Comparison: Previous exam(s).

CLINICAL DATA: Screening.

EXAM:
DIGITAL SCREENING BILATERAL MAMMOGRAM WITH TOMO AND CAD

[R CC synth-2D]
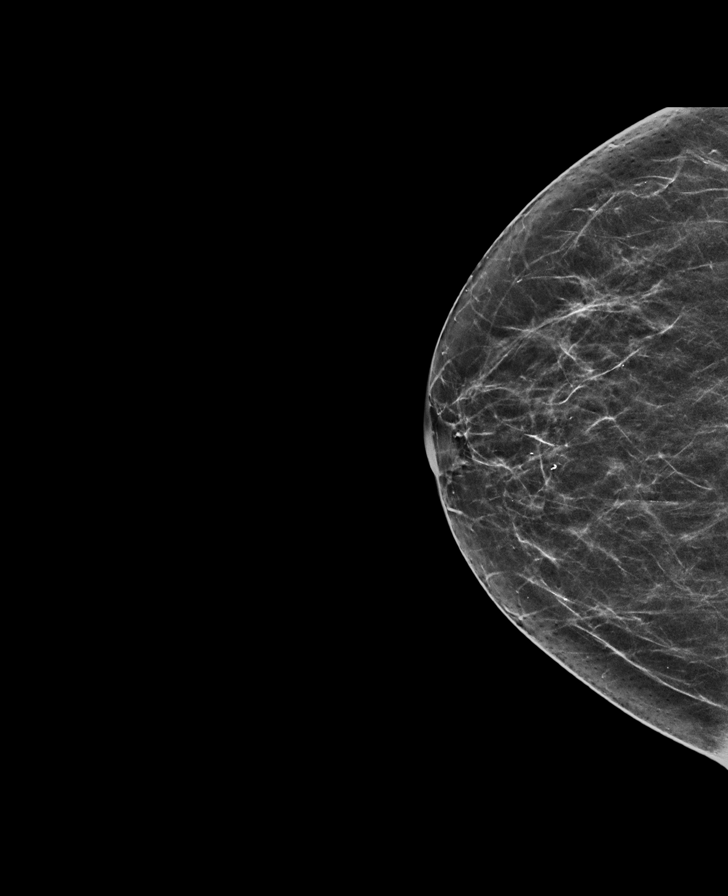

[L MLO synth-2D]
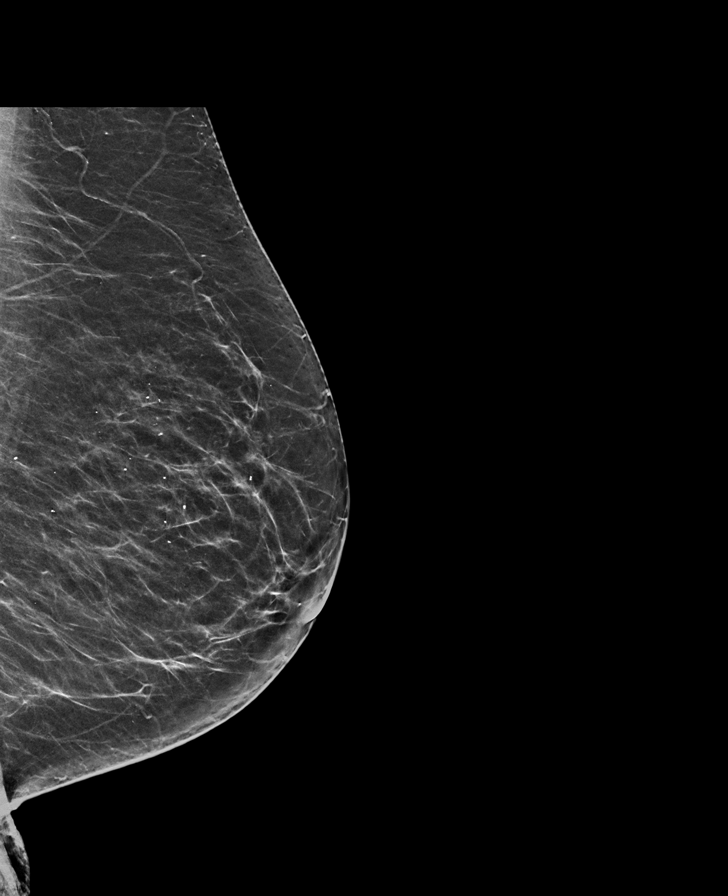

[R MLO synth-2D]
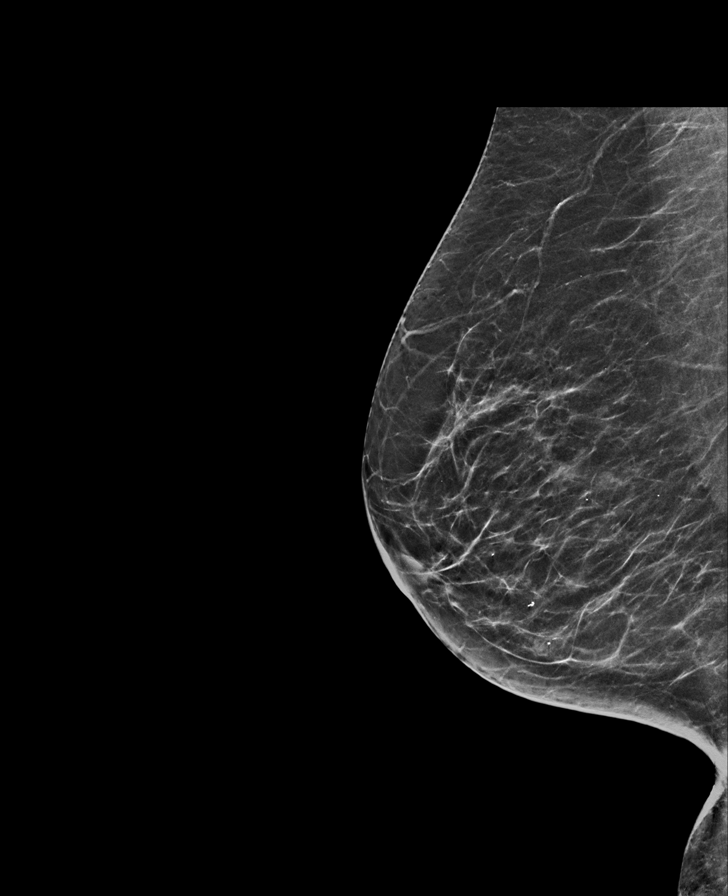

[L CC synth-2D]
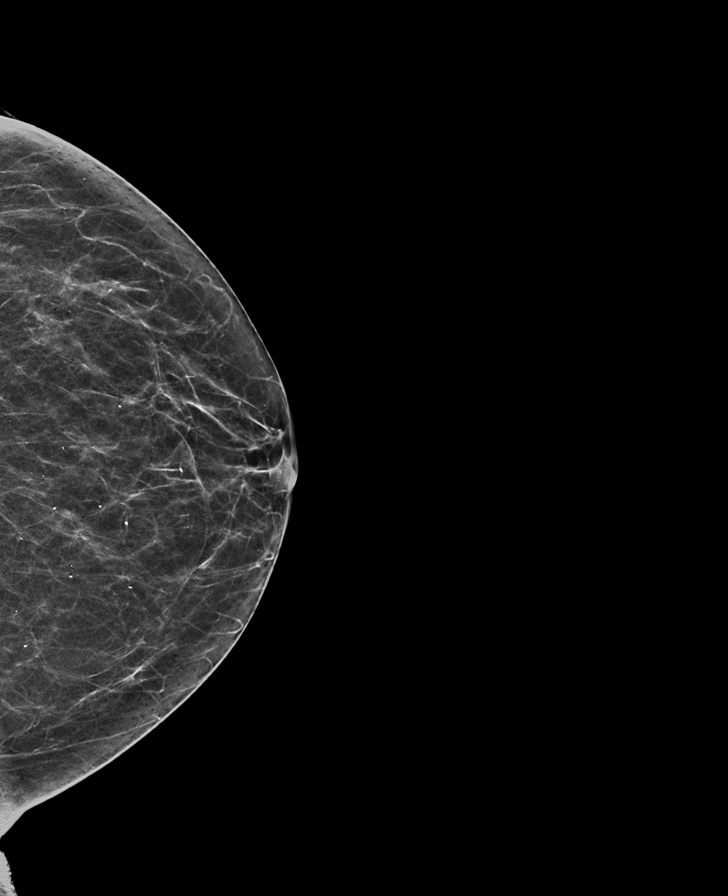

[L MLO tomo · tomo slice 32/63.0]
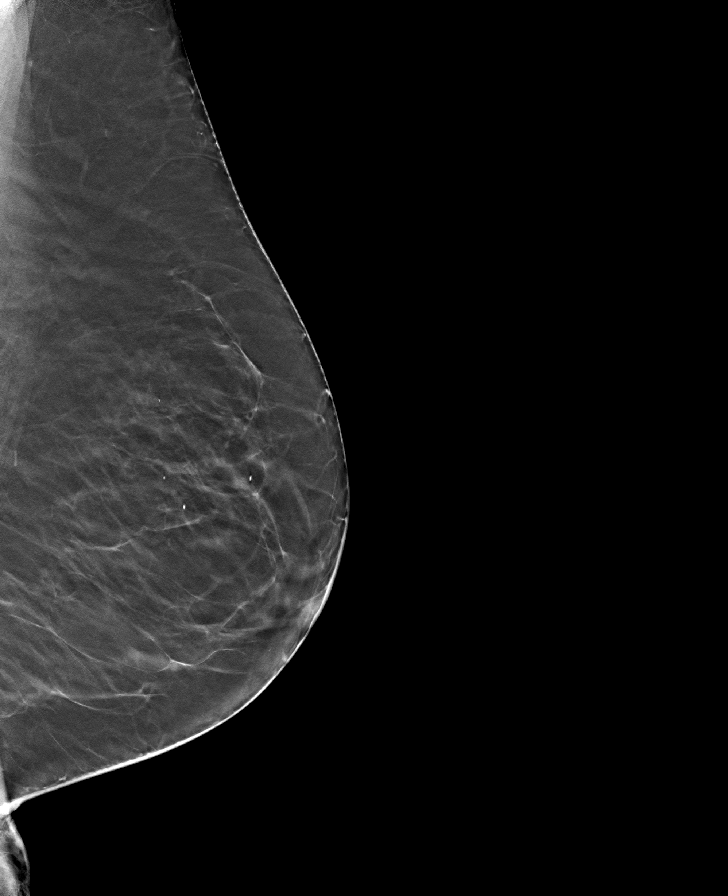

[R CC tomo · tomo slice 30/59.0]
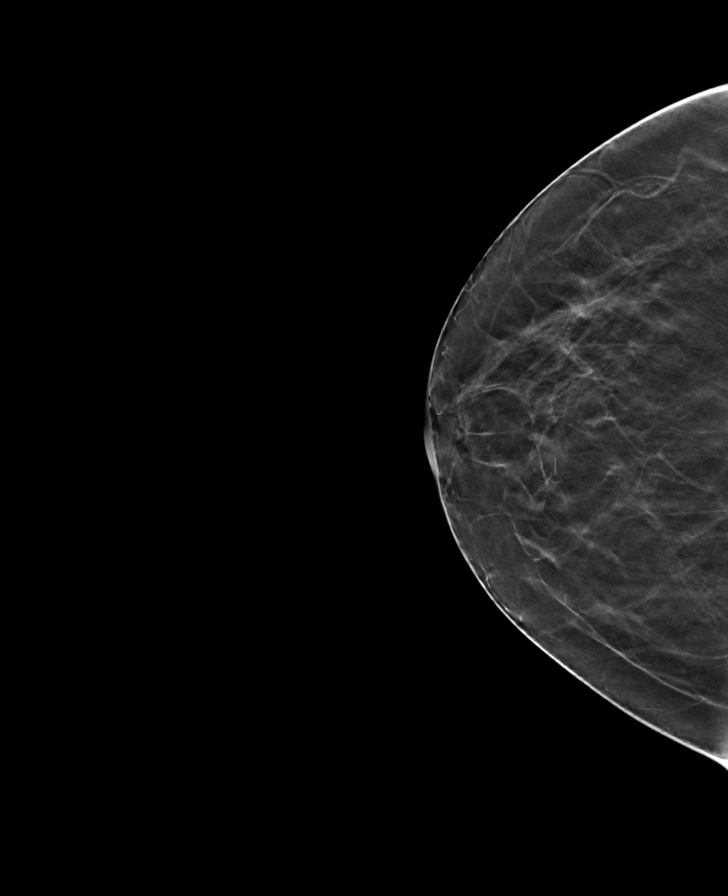

[R MLO tomo · tomo slice 31/62.0]
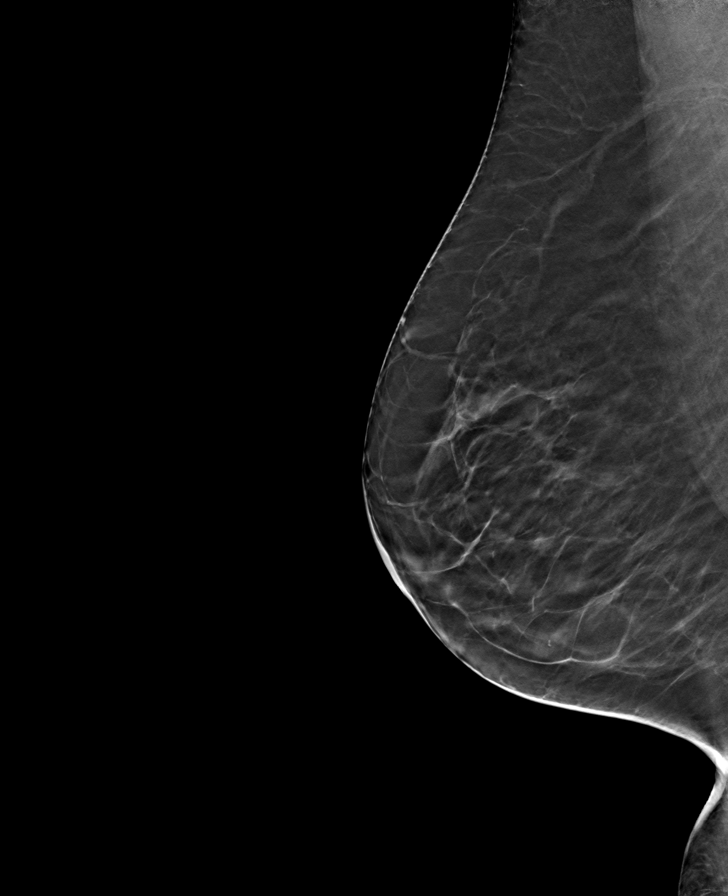

[L CC tomo · tomo slice 29/58.0]
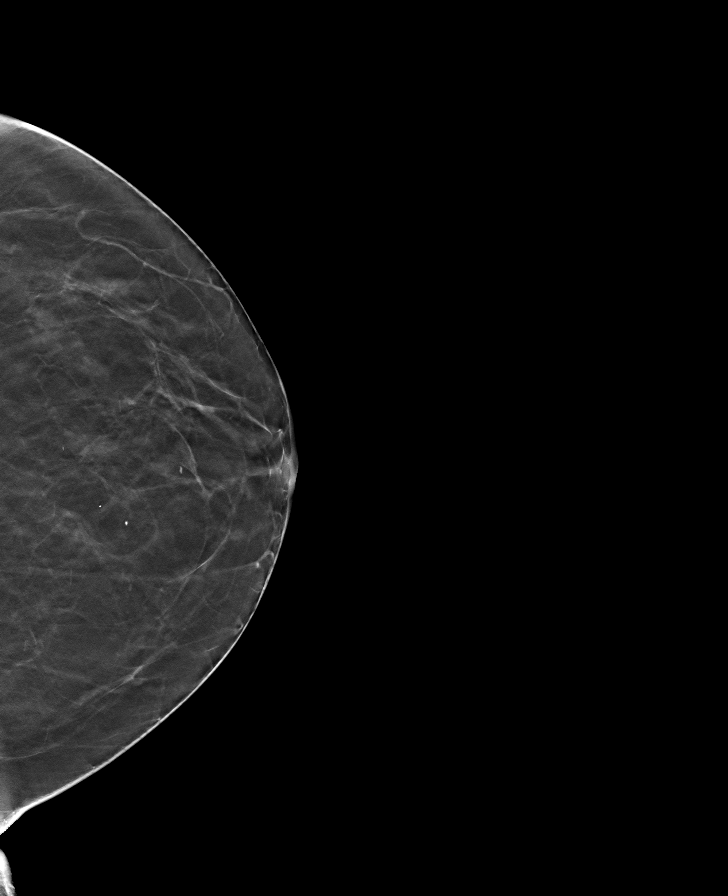

[8 of 24 positions shown; findings below may reference images not displayed]

ACR Breast Density Category b: There are scattered areas of
fibroglandular density.
FINDINGS: There are no findings suspicious for malignancy. Images were
processed with CAD.
IMPRESSION: No mammographic evidence of malignancy. A result letter of this
screening mammogram will be mailed directly to the patient.

RECOMMENDATION:
Screening mammogram in one year. (Code:[id number])

BI-RADS CATEGORY  1: Negative.

## 2021-04-07 DIAGNOSIS — M19011 Primary osteoarthritis, right shoulder: Secondary | ICD-10-CM | POA: Diagnosis not present

## 2021-05-02 ENCOUNTER — Encounter: Payer: Self-pay | Admitting: Internal Medicine

## 2021-05-02 ENCOUNTER — Other Ambulatory Visit: Payer: Self-pay

## 2021-05-02 ENCOUNTER — Ambulatory Visit (INDEPENDENT_AMBULATORY_CARE_PROVIDER_SITE_OTHER): Payer: Medicare Other | Admitting: Internal Medicine

## 2021-05-02 VITALS — BP 130/76 | HR 77 | Ht 66.0 in | Wt 178.2 lb

## 2021-05-02 DIAGNOSIS — K219 Gastro-esophageal reflux disease without esophagitis: Secondary | ICD-10-CM | POA: Diagnosis not present

## 2021-05-02 DIAGNOSIS — E782 Mixed hyperlipidemia: Secondary | ICD-10-CM | POA: Diagnosis not present

## 2021-05-02 DIAGNOSIS — I10 Essential (primary) hypertension: Secondary | ICD-10-CM

## 2021-05-02 DIAGNOSIS — M810 Age-related osteoporosis without current pathological fracture: Secondary | ICD-10-CM | POA: Diagnosis not present

## 2021-05-02 DIAGNOSIS — I700 Atherosclerosis of aorta: Secondary | ICD-10-CM

## 2021-05-02 DIAGNOSIS — Z0000 Encounter for general adult medical examination without abnormal findings: Secondary | ICD-10-CM

## 2021-05-02 DIAGNOSIS — Z1231 Encounter for screening mammogram for malignant neoplasm of breast: Secondary | ICD-10-CM | POA: Diagnosis not present

## 2021-05-02 NOTE — Progress Notes (Signed)
Date:  05/02/2021   Name:  Joanna Sanders   DOB:  January 31, 1930   MRN:  [id number]   Chief Complaint: Annual Exam (Breast Exam. No pap.) Joanna Sanders is a 87 y.o. female who presents today for her Complete Annual Exam. She feels well. She reports exercising little. She reports she is sleeping well. Breast complaints none.  She is having various joint aches and pains, the worst is her right shoulder.  She sees Ortho. She had a parathyroidectomy in March this year.  Placed on calcium and vitamin D and unsure how long to take this.  Mammogram: 07/2020 DEXA: 02/2017 Pap smear: discontinued Colonoscopy: 2017  Immunization History  Administered Date(s) Administered   Fluad Quad(high Dose 65+) 04/10/2019, 04/08/2020   Influenza, High Dose Seasonal PF 04/13/2017, 04/12/2018, 04/08/2020   Influenza,inj,Quad PF,6+ Mos 04/06/2016   Influenza-Unspecified 02/18/2015, 05/02/2021   PFIZER(Purple Top)SARS-COV-2 Vaccination 07/17/2019, 08/07/2019, 08/18/2019, 04/25/2020, 05/07/2020   Pfizer Covid-19 Vaccine Bivalent Booster 5y-11y 04/30/2021   Pneumococcal Conjugate-13 03/20/2014   Pneumococcal Polysaccharide-23 07/07/2005   Tdap 09/26/2015   Zoster Recombinat (Shingrix) 06/23/2018, 09/08/2018   Zoster, Live 07/08/2007    Hypertension This is a chronic problem. The problem is controlled. Associated symptoms include chest pain. Pertinent negatives include no headaches, palpitations or shortness of breath. Past treatments include diuretics.  Chest Pain  This is a new problem. The current episode started 1 to 4 weeks ago. The onset quality is sudden. Progression since onset: 2 episodes, lasted a few minutes. The pain is mild. The quality of the pain is described as squeezing. The pain radiates to the left neck. Pertinent negatives include no abdominal pain, cough, dizziness, fever, headaches, palpitations, shortness of breath or vomiting.  Her past medical history is significant for  hypertension.   05/2020: INTERPRETATION  Normal Stress Echocardiogram  Normal exercise ECG  Normal rest and stress echo images  Reduced exercise tolerance, 3.5 METS achieved   Lab Results  Component Value Date   CREATININE 1.05 (H) 10/15/2020   BUN 19 10/15/2020   NA 138 10/15/2020   K 4.3 10/15/2020   CL 100 10/15/2020   CO2 22 10/15/2020   Lab Results  Component Value Date   CHOL 192 05/02/2020   HDL 55 05/02/2020   LDLCALC 112 (H) 05/02/2020   TRIG 143 05/02/2020   CHOLHDL 3.5 05/02/2020   Lab Results  Component Value Date   TSH 1.320 05/02/2020   Lab Results  Component Value Date   HGBA1C 5.8 (H) 04/13/2017   Lab Results  Component Value Date   WBC 6.3 10/15/2020   HGB 11.2 10/15/2020   HCT 33.0 (L) 10/15/2020   MCV 92 10/15/2020   PLT 247 10/15/2020   Lab Results  Component Value Date   ALT 16 05/02/2020   AST 16 05/02/2020   ALKPHOS 102 05/02/2020   BILITOT 0.3 05/02/2020  Last vitamin D Lab Results  Component Value Date   VD25OH 27.1 (L) 05/02/2020     Review of Systems  Constitutional:  Negative for chills, fatigue and fever.  HENT:  Positive for hearing loss. Negative for congestion, tinnitus, trouble swallowing and voice change.   Eyes:  Negative for visual disturbance.  Respiratory:  Negative for cough, chest tightness, shortness of breath and wheezing.   Cardiovascular:  Positive for chest pain. Negative for palpitations and leg swelling.  Gastrointestinal:  Negative for abdominal pain, constipation, diarrhea and vomiting.  Endocrine: Negative for polydipsia and polyuria.  Genitourinary:  Negative for  dysuria, frequency, genital sores, vaginal bleeding and vaginal discharge.  Musculoskeletal:  Positive for arthralgias (right shoulder OA). Negative for gait problem and joint swelling.  Skin:  Negative for color change and rash.  Neurological:  Negative for dizziness, tremors, light-headedness and headaches.  Hematological:  Negative for  adenopathy. Does not bruise/bleed easily.  Psychiatric/Behavioral:  Negative for dysphoric mood and sleep disturbance. The patient is not nervous/anxious.    Patient Active Problem List   Diagnosis Date Noted   Acute bacterial rhinosinusitis 11/01/2020   Aortic atherosclerosis ([place]) 10/15/2020   S/P parathyroidectomy ([place]) 10/15/2020   Anemia, vitamin B12 deficiency 06/24/2020   IPMN (intraductal papillary mucinous neoplasm) 06/06/2018   Postcholecystectomy diarrhea 06/06/2018   Plantar fascial fibromatosis 10/12/2017   Osteoarthritis of knee 10/12/2017   Thigh numbness 10/12/2017   OSA (obstructive sleep apnea) 09/18/2016   Chronic left shoulder pain 06/18/2016   Abdominal pain, chronic, right upper quadrant 04/29/2016   Extrahepatic biloma 04/15/2016   Age-related osteoporosis without current pathological fracture 04/06/2016   Allergic rhinitis 01/24/2015   Benign essential HTN 01/24/2015   Hyperlipidemia, mixed 01/24/2015   Gastroesophageal reflux disease 01/24/2015   Generalized OA 01/24/2015   Avitaminosis D 01/24/2015    Allergies  Allergen Reactions   Nitrofuran Derivatives Diarrhea   Amoxicillin Itching    Believes that was amoxicillin. Not 762% certain which antibiotic.     Hydrochlorothiazide     hypercalcemia   Prednisone Other (See Comments)    Gets really hyper    Past Surgical History:  Procedure Laterality Date   CARPAL TUNNEL RELEASE Right    CATARACT EXTRACTION     CHOLECYSTECTOMY  2013   PARATHYROIDECTOMY  08/21/2020   TOTAL SHOULDER REPLACEMENT Left 2013    Social History   Tobacco Use   Smoking status: Former    Types: Cigarettes    Quit date: 07/06/1996    Years since quitting: 24.8   Smokeless tobacco: Never  Vaping Use   Vaping Use: Never used  Substance Use Topics   Alcohol use: Yes    Alcohol/week: 3.0 standard drinks    Types: 3 Glasses of wine per week   Drug use: Never     Medication list has been reviewed and  updated.  Current Meds  Medication Sig   acetaminophen (TYLENOL) 500 MG tablet Take by mouth.   alendronate (FOSAMAX) 70 MG tablet Take 1 tablet (70 mg total) by mouth once a week.   Calcium Carbonate-Vit D-Min (CALTRATE 600+D PLUS PO) Take 2 tablets by mouth daily at 2 PM.   Cholecalciferol 25 MCG (1000 UT) capsule Take by mouth.   colestipol (COLESTID) 1 g tablet Take 1 g by mouth 2 (two) times daily. PRN   esomeprazole (NEXIUM) 40 MG capsule Take 40 mg by mouth daily at 12 noon.   fluticasone (FLONASE) 50 MCG/ACT nasal spray Place 1 spray into both nostrils daily.   furosemide (LASIX) 20 MG tablet Take 1 tablet (20 mg total) by mouth 2 (two) times daily.   meclizine (ANTIVERT) 12.5 MG tablet Take 1 tablet (12.5 mg total) by mouth 3 (three) times daily as needed for dizziness.   meloxicam (MOBIC) 15 MG tablet Take 1 tablet by mouth daily.   ondansetron (ZOFRAN) 4 MG tablet Take 1 tablet (4 mg total) by mouth every 8 (eight) hours as needed for nausea or vomiting.   Polyethyl Glycol-Propyl Glycol 0.4-0.3 % SOLN Apply to eye.    PHQ 2/9 Scores 05/02/2021 11/05/2020 10/15/2020 06/12/2020  PHQ -  2 Score 0 0 0 0  PHQ- 9 Score 0 1 0 -    GAD 7 : Generalized Anxiety Score 05/02/2021 11/05/2020 10/15/2020 06/10/2020  Nervous, Anxious, on Edge 0 0 0 0  Control/stop worrying 0 0 0 0  Worry too much - different things 0 0 0 0  Trouble relaxing 0 0 0 0  Restless 0 0 0 0  Easily annoyed or irritable 0 0 0 0  Afraid - awful might happen 0 0 0 0  Total GAD 7 Score 0 0 0 0  Anxiety Difficulty Not difficult at all Not difficult at all - Not difficult at all    BP Readings from Last 3 Encounters:  05/02/21 130/76  11/05/20 128/80  11/01/20 127/79    Physical Exam Vitals and nursing note reviewed.  Constitutional:      General: She is not in acute distress.    Appearance: Normal appearance. She is well-developed.  HENT:     Head: Normocephalic and atraumatic.     Right Ear: Tympanic membrane  and ear canal normal.     Left Ear: Tympanic membrane and ear canal normal.     Nose:     Right Sinus: No maxillary sinus tenderness.     Left Sinus: No maxillary sinus tenderness.  Eyes:     General: No scleral icterus.       Right eye: No discharge.        Left eye: No discharge.     Conjunctiva/sclera: Conjunctivae normal.  Neck:     Thyroid: No thyromegaly.     Vascular: No carotid bruit.  Cardiovascular:     Rate and Rhythm: Normal rate and regular rhythm.     Pulses: Normal pulses.     Heart sounds: Normal heart sounds.  Pulmonary:     Effort: Pulmonary effort is normal. No respiratory distress.     Breath sounds: No wheezing.  Chest:  Breasts:    Right: No mass, nipple discharge, skin change or tenderness.     Left: No mass, nipple discharge, skin change or tenderness.  Abdominal:     General: Bowel sounds are normal.     Palpations: Abdomen is soft.     Tenderness: There is no abdominal tenderness.  Musculoskeletal:        General: Normal range of motion.     Cervical back: Normal range of motion. No erythema.     Right lower leg: Edema (mild ankle edema) present.     Left lower leg: No edema.  Lymphadenopathy:     Cervical: No cervical adenopathy.  Skin:    General: Skin is warm and dry.     Capillary Refill: Capillary refill takes less than 2 seconds.     Findings: No rash.  Neurological:     General: No focal deficit present.     Mental Status: She is alert and oriented to person, place, and time.     Cranial Nerves: No cranial nerve deficit.     Sensory: No sensory deficit.     Deep Tendon Reflexes: Reflexes are normal and symmetric.  Psychiatric:        Attention and Perception: Attention normal.        Mood and Affect: Mood normal.        Behavior: Behavior normal.        Thought Content: Thought content normal.    Wt Readings from Last 3 Encounters:  05/02/21 178 lb 3.2 oz (80.8 kg)  11/05/20 174  lb (78.9 kg)  11/01/20 172 lb (78 kg)    BP  130/76   Pulse 77   Ht 5\' 6"  (1.676 m)   Wt 178 lb 3.2 oz (80.8 kg)   SpO2 98%   BMI 28.76 kg/m   Assessment and Plan: 1. Annual physical exam Normal exam - doing excellent for age Recovered well from surgery - recommend continuing calcium and vitamin D indefinitely  2. Encounter for screening mammogram for breast cancer - MM 3D SCREEN BREAST BILATERAL  3. Benign essential HTN Clinically stable exam with well controlled BP on lasix. Tolerating medications without side effects at this time. Pt to continue current regimen and low sodium diet; benefits of regular exercise as able discussed. - CBC with Differential/Platelet - Comprehensive metabolic panel - TSH  4. Gastroesophageal reflux disease without esophagitis Symptoms well controlled on daily PPI.  CXR changes with ground glass appearance suggested chronic reflux - pulmonary recommend an adjustable bed which has solved the problem. No red flag signs such as weight loss, n/v, melena Will continue Nexium. - CBC with Differential/Platelet  5. Aortic atherosclerosis ([place]) I would not initiate a statin but ASA 81 mg daily might be reasonable. If chest pains worsen, would not hesitate to consult Cardiology.  6. Age-related osteoporosis without current pathological fracture Continue calcium and vitamin D - VITAMIN D 25 Hydroxy (Vit-D Deficiency, Fractures)  7. Hyperlipidemia, mixed Will obtain labs; advise low fat diet without statin therapy at this time. - Lipid panel   Partially dictated using Editor, commissioning. Any errors are unintentional.  Halina Maidens, MD [facility]  05/02/2021

## 2021-05-03 LAB — COMPREHENSIVE METABOLIC PANEL
ALT: 16 IU/L (ref 0–32)
AST: 21 IU/L (ref 0–40)
Albumin/Globulin Ratio: 2 (ref 1.2–2.2)
Albumin: 4.5 g/dL (ref 3.6–4.6)
Alkaline Phosphatase: 91 IU/L (ref 44–121)
BUN/Creatinine Ratio: 19 (ref 12–28)
BUN: 19 mg/dL (ref 8–27)
Bilirubin Total: 0.5 mg/dL (ref 0.0–1.2)
CO2: 25 mmol/L (ref 20–29)
Calcium: 9.3 mg/dL (ref 8.7–10.3)
Chloride: 97 mmol/L (ref 96–106)
Creatinine, Ser: 1 mg/dL (ref 0.57–1.00)
Globulin, Total: 2.3 g/dL (ref 1.5–4.5)
Glucose: 90 mg/dL (ref 70–99)
Potassium: 4.3 mmol/L (ref 3.5–5.2)
Sodium: 136 mmol/L (ref 134–144)
Total Protein: 6.8 g/dL (ref 6.0–8.5)
eGFR: 54 mL/min/{1.73_m2} — ABNORMAL LOW (ref >59)

## 2021-05-03 LAB — CBC WITH DIFFERENTIAL/PLATELET
Basophils Absolute: 0 10*3/uL (ref 0.0–0.2)
Basos: 1 %
EOS (ABSOLUTE): 0.2 10*3/uL (ref 0.0–0.4)
Eos: 4 %
Hematocrit: 33.4 % — ABNORMAL LOW (ref 34.0–46.6)
Hemoglobin: 11.5 g/dL (ref 11.1–15.9)
Immature Grans (Abs): 0 10*3/uL (ref 0.0–0.1)
Immature Granulocytes: 1 %
Lymphocytes Absolute: 1.9 10*3/uL (ref 0.7–3.1)
Lymphs: 32 %
MCH: 31.8 pg (ref 26.6–33.0)
MCHC: 34.4 g/dL (ref 31.5–35.7)
MCV: 92 fL (ref 79–97)
Monocytes Absolute: 0.7 10*3/uL (ref 0.1–0.9)
Monocytes: 12 %
Neutrophils Absolute: 2.9 10*3/uL (ref 1.4–7.0)
Neutrophils: 50 %
Platelets: 246 10*3/uL (ref 150–450)
RBC: 3.62 x10E6/uL — ABNORMAL LOW (ref 3.77–5.28)
RDW: 13.2 % (ref 11.7–15.4)
WBC: 5.8 10*3/uL (ref 3.4–10.8)

## 2021-05-03 LAB — LIPID PANEL
Chol/HDL Ratio: 3.7 ratio (ref 0.0–4.4)
Cholesterol, Total: 175 mg/dL (ref 100–199)
HDL: 47 mg/dL (ref >39)
LDL Chol Calc (NIH): 106 mg/dL — ABNORMAL HIGH (ref 0–99)
Triglycerides: 120 mg/dL (ref 0–149)
VLDL Cholesterol Cal: 22 mg/dL (ref 5–40)

## 2021-05-03 LAB — VITAMIN D 25 HYDROXY (VIT D DEFICIENCY, FRACTURES): Vit D, 25-Hydroxy: 38.3 ng/mL (ref 30.0–100.0)

## 2021-05-03 LAB — TSH: TSH: 2.18 u[IU]/mL (ref 0.450–4.500)

## 2021-05-05 DIAGNOSIS — H903 Sensorineural hearing loss, bilateral: Secondary | ICD-10-CM | POA: Diagnosis not present

## 2021-05-21 ENCOUNTER — Other Ambulatory Visit: Payer: Self-pay

## 2021-05-21 ENCOUNTER — Ambulatory Visit (INDEPENDENT_AMBULATORY_CARE_PROVIDER_SITE_OTHER): Payer: Medicare Other | Admitting: Internal Medicine

## 2021-05-21 ENCOUNTER — Ambulatory Visit: Payer: Self-pay | Admitting: *Deleted

## 2021-05-21 ENCOUNTER — Encounter: Payer: Self-pay | Admitting: Internal Medicine

## 2021-05-21 VITALS — BP 138/78 | HR 94 | Ht 66.0 in | Wt 177.0 lb

## 2021-05-21 DIAGNOSIS — R609 Edema, unspecified: Secondary | ICD-10-CM

## 2021-05-21 DIAGNOSIS — R0609 Other forms of dyspnea: Secondary | ICD-10-CM | POA: Diagnosis not present

## 2021-05-21 NOTE — Telephone Encounter (Signed)
Reason for Disposition  [1] MILD swelling of both ankles (i.e., pedal edema) AND [2] new-onset or worsening  Answer Assessment - Initial Assessment Questions 1. ONSET: "When did the swelling start?" (e.g., minutes, hours, days)     My right ankle and foot is swollen.  My left ankle is slightly swollen. It's been coming on for 2 weeks.   Worse in the last week. 2. LOCATION: "What part of the leg is swollen?"  "Are both legs swollen or just one leg?"     Right ankle and foot.   Left slightly swollen. 3. SEVERITY: "How bad is the swelling?" (e.g., localized; mild, moderate, severe)  - Localized - small area of swelling localized to one leg  - MILD pedal edema - swelling limited to foot and ankle, pitting edema < 1/4 inch (6 mm) deep, rest and elevation eliminate most or all swelling  - MODERATE edema - swelling of lower leg to knee, pitting edema > 1/4 inch (6 mm) deep, rest and elevation only partially reduce swelling  - SEVERE edema - swelling extends above knee, facial or hand swelling present      My right ankle is round I can just barely see my ankle bone.   It's from my instep to top of my foot.   It's pitting edema. 4. REDNESS: "Does the swelling look red or infected?"     Right ankle a little pinkish.   Equal warmth 5. PAIN: "Is the swelling painful to touch?" If Yes, ask: "How painful is it?"   (Scale 1-10; mild, moderate or severe)     Pain after I've had a shoe on.   My shoe is tight.   6. FEVER: "Do you have a fever?" If Yes, ask: "What is it, how was it measured, and when did it start?"      No 7. CAUSE: "What do you think is causing the leg swelling?"     None   This is new 8. MEDICAL HISTORY: "Do you have a history of heart failure, kidney disease, liver failure, or cancer?"     No heart or kidney problems.   I had my physical 3 weeks and it was fine.   My ankle was slightly swollen at that time but it was only slight so I did not mention it.   9. RECURRENT SYMPTOM: "Have you had  leg swelling before?" If Yes, ask: "When was the last time?" "What happened that time?"     No 10. OTHER SYMPTOMS: "Do you have any other symptoms?" (e.g., chest pain, difficulty breathing)       No chest pain or shortness of breath 11. PREGNANCY: "Is there any chance you are pregnant?" "When was your last menstrual period?"       N/A  Protocols used: Leg Swelling and Edema-A-AH

## 2021-05-21 NOTE — Progress Notes (Signed)
Date:  05/21/2021   Name:  Joanna Sanders   DOB:  02/26/1930   MRN:  [id number]   Chief Complaint: Foot Swelling (Bilateral ankles and feet; x2-3 weeks; pain/discomfort later in the day; 1/10 pain in office)  Shortness of Breath This is a recurrent problem. The problem occurs daily. The problem has been gradually worsening. Associated symptoms include leg swelling. Pertinent negatives include no chest pain, fever, headaches, orthopnea or wheezing. The symptoms are aggravated by exercise (only able to walk about half the usual distance over the past few weeks).   Lab Results  Component Value Date   CREATININE 1.00 05/02/2021   BUN 19 05/02/2021   NA 136 05/02/2021   K 4.3 05/02/2021   CL 97 05/02/2021   CO2 25 05/02/2021   Lab Results  Component Value Date   CHOL 175 05/02/2021   HDL 47 05/02/2021   LDLCALC 106 (H) 05/02/2021   TRIG 120 05/02/2021   CHOLHDL 3.7 05/02/2021   Lab Results  Component Value Date   TSH 2.180 05/02/2021   Lab Results  Component Value Date   HGBA1C 5.8 (H) 04/13/2017   Lab Results  Component Value Date   WBC 5.8 05/02/2021   HGB 11.5 05/02/2021   HCT 33.4 (L) 05/02/2021   MCV 92 05/02/2021   PLT 246 05/02/2021   Lab Results  Component Value Date   ALT 16 05/02/2021   AST 21 05/02/2021   ALKPHOS 91 05/02/2021   BILITOT 0.5 05/02/2021     Review of Systems  Constitutional:  Negative for fatigue and fever.  Respiratory:  Positive for shortness of breath. Negative for cough, chest tightness and wheezing.   Cardiovascular:  Positive for leg swelling. Negative for chest pain and orthopnea.  Neurological:  Negative for dizziness and headaches.   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 Not 017% certain which antibiotic.     Hydrochlorothiazide     hypercalcemia   Prednisone Other (See Comments)    Gets really hyper    Past Surgical History:  Procedure Laterality Date   CARPAL TUNNEL RELEASE Right    CATARACT EXTRACTION     CHOLECYSTECTOMY  2013   PARATHYROIDECTOMY  08/21/2020   removed one gland   TOTAL SHOULDER REPLACEMENT Left 2013    Social History   Tobacco Use   Smoking status: Former    Types: Cigarettes    Quit date: 07/06/1996    Years since quitting: 24.8   Smokeless tobacco: Never  Vaping Use   Vaping Use: Never used  Substance Use Topics   Alcohol use: Yes  Alcohol/week: 3.0 standard drinks    Types: 3 Glasses of wine per week   Drug use: Never     Medication list has been reviewed and updated.  Current Meds  Medication Sig   acetaminophen (TYLENOL) 500 MG tablet Take 1,000 mg by mouth every 6 (six) hours as needed.   alendronate (FOSAMAX) 70 MG tablet Take 1 tablet (70 mg total) by mouth once a week.   Calcium Carbonate-Vit D-Min (CALTRATE 600+D PLUS PO) Take 2 tablets by mouth daily at 2 PM.   colestipol (COLESTID) 1 g tablet Take 1 g by mouth 2 (two) times daily. PRN   esomeprazole (NEXIUM) 40 MG capsule Take 40  mg by mouth daily at 12 noon.   fluticasone (FLONASE) 50 MCG/ACT nasal spray Place 1 spray into both nostrils daily. (Patient taking differently: Place 1 spray into both nostrils daily as needed.)   furosemide (LASIX) 20 MG tablet Take 1 tablet (20 mg total) by mouth 2 (two) times daily.   ketoconazole (NIZORAL) 2 % cream Apply 1 application topically 2 (two) times daily.   meclizine (ANTIVERT) 12.5 MG tablet Take 1 tablet (12.5 mg total) by mouth 3 (three) times daily as needed for dizziness.   meloxicam (MOBIC) 15 MG tablet Take 1 tablet by mouth daily.   ondansetron (ZOFRAN) 4 MG tablet Take 1 tablet (4 mg total) by mouth every 8 (eight) hours as needed for nausea or vomiting.   Polyethyl Glycol-Propyl Glycol 0.4-0.3 % SOLN Place 1 drop into both eyes as needed.    PHQ 2/9 Scores 05/02/2021 11/05/2020 10/15/2020 06/12/2020  PHQ - 2 Score 0 0 0 0  PHQ- 9 Score 0 1 0 -    GAD 7 : Generalized Anxiety Score 05/02/2021 11/05/2020 10/15/2020 06/10/2020  Nervous, Anxious, on Edge 0 0 0 0  Control/stop worrying 0 0 0 0  Worry too much - different things 0 0 0 0  Trouble relaxing 0 0 0 0  Restless 0 0 0 0  Easily annoyed or irritable 0 0 0 0  Afraid - awful might happen 0 0 0 0  Total GAD 7 Score 0 0 0 0  Anxiety Difficulty Not difficult at all Not difficult at all - Not difficult at all    BP Readings from Last 3 Encounters:  05/21/21 138/78  05/02/21 130/76  11/05/20 128/80    Physical Exam Vitals and nursing note reviewed.  Constitutional:      General: She is not in acute distress.    Appearance: Normal appearance. She is well-developed.  HENT:     Head: Normocephalic and atraumatic.  Cardiovascular:     Rate and Rhythm: Normal rate and regular rhythm.     Pulses: Normal pulses.  Pulmonary:     Effort: Pulmonary effort is normal. No respiratory distress.     Breath sounds: Normal breath sounds. No wheezing or rhonchi.  Musculoskeletal:        General: Normal range of motion.      Cervical back: Normal range of motion.     Right lower leg: Edema present.     Left lower leg: No edema.  Lymphadenopathy:     Cervical: No cervical adenopathy.  Skin:    General: Skin is warm and dry.     Capillary Refill: Capillary refill takes less than 2 seconds.     Findings: No rash.  Neurological:     General: No focal deficit present.     Mental Status: She is alert and  oriented to person, place, and time.  Psychiatric:        Mood and Affect: Mood normal.        Behavior: Behavior normal.    Wt Readings from Last 3 Encounters:  05/21/21 177 lb (80.3 kg)  05/02/21 178 lb 3.2 oz (80.8 kg)  11/05/20 174 lb (78.9 kg)    BP 138/78 (BP Location: Right Arm, Patient Position: Sitting, Cuff Size: Normal)   Pulse 94   Ht 5\' 6"  (1.676 m)   Wt 177 lb (80.3 kg)   SpO2 98%   BMI 28.57 kg/m   Assessment and Plan: 1. DOE (dyspnea on exertion) Mild ankle edema without any change in sodium intake Decreased exercise tolerance over the past few weeks without chest pain. BP normal, negative stress test last year.  If BNP elevated will refer back to Dr. Zigmund Daniel - B Nat Peptide  2. Dependent edema Increase lasix to 30 mg every AM Continue salt restriction   Partially dictated using Editor, commissioning. Any errors are unintentional.  Halina Maidens, MD [facility]  05/21/2021

## 2021-05-21 NOTE — Telephone Encounter (Signed)
Pt called in c/o her right ankle and foot being swollen with pitting edema that has been getting worse over the past 2 weeks.  Also has mild swelling in her left ankle.    This is new for her.    See triage notes.  I made her an appt with Dr. Army Melia for today at 1:40 in office visit.   She said she could make it in time for the appt.  I went over the care advice which she was already doing.  Notes sent to [facility] for Dr. Army Melia.

## 2021-05-22 DIAGNOSIS — M19011 Primary osteoarthritis, right shoulder: Secondary | ICD-10-CM | POA: Diagnosis not present

## 2021-05-22 LAB — BRAIN NATRIURETIC PEPTIDE: BNP: 3.5 pg/mL (ref 0.0–100.0)

## 2021-05-28 ENCOUNTER — Other Ambulatory Visit: Payer: Self-pay | Admitting: Internal Medicine

## 2021-05-28 DIAGNOSIS — I10 Essential (primary) hypertension: Secondary | ICD-10-CM

## 2021-05-28 NOTE — Telephone Encounter (Signed)
Requested medications are due for refill today.  yes  Requested medications are on the active medications list.  yes  Last refill. 05/02/2020  Future visit scheduled.   yes  Notes to clinic.  Per note of 05/21/2021 visit pt was to change morning dose to 30 mg. Please advise.

## 2021-06-02 DIAGNOSIS — Z87891 Personal history of nicotine dependence: Secondary | ICD-10-CM | POA: Diagnosis not present

## 2021-06-02 DIAGNOSIS — Z86718 Personal history of other venous thrombosis and embolism: Secondary | ICD-10-CM | POA: Diagnosis not present

## 2021-06-02 DIAGNOSIS — I10 Essential (primary) hypertension: Secondary | ICD-10-CM | POA: Diagnosis not present

## 2021-06-02 DIAGNOSIS — E213 Hyperparathyroidism, unspecified: Secondary | ICD-10-CM | POA: Diagnosis not present

## 2021-06-02 DIAGNOSIS — S0502XA Injury of conjunctiva and corneal abrasion without foreign body, left eye, initial encounter: Secondary | ICD-10-CM | POA: Diagnosis not present

## 2021-06-02 DIAGNOSIS — Z880 Allergy status to penicillin: Secondary | ICD-10-CM | POA: Diagnosis not present

## 2021-06-02 DIAGNOSIS — H5712 Ocular pain, left eye: Secondary | ICD-10-CM | POA: Diagnosis not present

## 2021-06-03 DIAGNOSIS — W208XXA Other cause of strike by thrown, projected or falling object, initial encounter: Secondary | ICD-10-CM | POA: Diagnosis not present

## 2021-06-03 DIAGNOSIS — S0502XA Injury of conjunctiva and corneal abrasion without foreign body, left eye, initial encounter: Secondary | ICD-10-CM | POA: Diagnosis not present

## 2021-06-16 ENCOUNTER — Ambulatory Visit: Payer: Medicare Other

## 2021-07-15 ENCOUNTER — Ambulatory Visit (INDEPENDENT_AMBULATORY_CARE_PROVIDER_SITE_OTHER): Payer: Medicare Other | Admitting: Internal Medicine

## 2021-07-15 ENCOUNTER — Other Ambulatory Visit: Payer: Self-pay

## 2021-07-15 ENCOUNTER — Other Ambulatory Visit
Admission: RE | Admit: 2021-07-15 | Discharge: 2021-07-15 | Disposition: A | Discharge status: Home / Self Care | Payer: Medicare Other | Source: Home / Self Care | Attending: Internal Medicine | Admitting: Internal Medicine

## 2021-07-15 ENCOUNTER — Encounter: Payer: Self-pay | Admitting: Internal Medicine

## 2021-07-15 ENCOUNTER — Ambulatory Visit
Admission: RE | Admit: 2021-07-15 | Discharge: 2021-07-15 | Disposition: A | Discharge status: Home / Self Care | Payer: Medicare Other | Attending: Internal Medicine | Admitting: Internal Medicine

## 2021-07-15 ENCOUNTER — Ambulatory Visit
Admission: RE | Admit: 2021-07-15 | Discharge: 2021-07-15 | Disposition: A | Discharge status: Home / Self Care | Payer: Medicare Other | Source: Ambulatory Visit | Attending: Internal Medicine | Admitting: Internal Medicine

## 2021-07-15 VITALS — BP 139/85 | HR 89 | Ht 66.0 in | Wt 171.2 lb

## 2021-07-15 DIAGNOSIS — R0789 Other chest pain: Secondary | ICD-10-CM

## 2021-07-15 DIAGNOSIS — R0602 Shortness of breath: Secondary | ICD-10-CM | POA: Insufficient documentation

## 2021-07-15 DIAGNOSIS — N3000 Acute cystitis without hematuria: Secondary | ICD-10-CM | POA: Diagnosis not present

## 2021-07-15 DIAGNOSIS — R079 Chest pain, unspecified: Secondary | ICD-10-CM | POA: Diagnosis not present

## 2021-07-15 LAB — CBC WITH DIFFERENTIAL/PLATELET
Abs Immature Granulocytes: 0.06 10*3/uL (ref 0.00–0.07)
Basophils Absolute: 0.1 10*3/uL (ref 0.0–0.1)
Basophils Relative: 1 %
Eosinophils Absolute: 0.1 10*3/uL (ref 0.0–0.5)
Eosinophils Relative: 1 %
HCT: 32.8 % — ABNORMAL LOW (ref 36.0–46.0)
Hemoglobin: 10.3 g/dL — ABNORMAL LOW (ref 12.0–15.0)
Immature Granulocytes: 1 %
Lymphocytes Relative: 28 %
Lymphs Abs: 2.1 10*3/uL (ref 0.7–4.0)
MCH: 28.2 pg (ref 26.0–34.0)
MCHC: 31.4 g/dL (ref 30.0–36.0)
MCV: 89.9 fL (ref 80.0–100.0)
Monocytes Absolute: 0.8 10*3/uL (ref 0.1–1.0)
Monocytes Relative: 11 %
Neutro Abs: 4.4 10*3/uL (ref 1.7–7.7)
Neutrophils Relative %: 58 %
Platelets: 313 10*3/uL (ref 150–400)
RBC: 3.65 MIL/uL — ABNORMAL LOW (ref 3.87–5.11)
RDW: 13.8 % (ref 11.5–15.5)
WBC: 7.5 10*3/uL (ref 4.0–10.5)
nRBC: 0 % (ref 0.0–0.2)

## 2021-07-15 LAB — COMPREHENSIVE METABOLIC PANEL
ALT: 15 U/L (ref 0–44)
AST: 19 U/L (ref 15–41)
Albumin: 4.4 g/dL (ref 3.5–5.0)
Alkaline Phosphatase: 74 U/L (ref 38–126)
Anion gap: 7 (ref 5–15)
BUN: 15 mg/dL (ref 8–23)
CO2: 25 mmol/L (ref 22–32)
Calcium: 9.3 mg/dL (ref 8.9–10.3)
Chloride: 99 mmol/L (ref 98–111)
Creatinine, Ser: 0.93 mg/dL (ref 0.44–1.00)
GFR, Estimated: 59 mL/min — ABNORMAL LOW (ref >60)
Glucose, Bld: 100 mg/dL — ABNORMAL HIGH (ref 70–99)
Potassium: 3.7 mmol/L (ref 3.5–5.1)
Sodium: 131 mmol/L — ABNORMAL LOW (ref 135–145)
Total Bilirubin: 0.6 mg/dL (ref 0.3–1.2)
Total Protein: 7.2 g/dL (ref 6.5–8.1)

## 2021-07-15 LAB — POCT URINALYSIS DIPSTICK
Bilirubin, UA: NEGATIVE
Blood, UA: NEGATIVE
Glucose, UA: NEGATIVE
Ketones, UA: NEGATIVE
Nitrite, UA: POSITIVE
Protein, UA: POSITIVE — AB
Spec Grav, UA: 1.025 (ref 1.010–1.025)
Urobilinogen, UA: 0.2 E.U./dL
pH, UA: 6 (ref 5.0–8.0)

## 2021-07-15 MED ORDER — CEFDINIR 300 MG PO CAPS: 300.0000 mg | ORAL_CAPSULE | Freq: Two times a day (BID) | ORAL | 0 refills | Status: AC

## 2021-07-15 NOTE — Progress Notes (Signed)
Date:  07/15/2021   Name:  Joanna Sanders   DOB:  02/04/1930   MRN:  [id number]   Chief Complaint: Chest Pain  Chest Pain  This is a new problem. The current episode started in the past 7 days. The onset quality is gradual. The pain is moderate. The quality of the pain is described as tightness. The pain does not radiate. Associated symptoms include a cough, malaise/fatigue, palpitations and shortness of breath. Pertinent negatives include no abdominal pain, dizziness, fever, headaches, nausea or vomiting.  Urinary Tract Infection  This is a new problem. The current episode started yesterday. The problem has been unchanged. The quality of the pain is described as burning. The pain is mild. There has been no fever. Pertinent negatives include no chills, nausea or vomiting.   Lab Results  Component Value Date   NA 136 05/02/2021   K 4.3 05/02/2021   CO2 25 05/02/2021   GLUCOSE 90 05/02/2021   BUN 19 05/02/2021   CREATININE 1.00 05/02/2021   CALCIUM 9.3 05/02/2021   EGFR 54 (L) 05/02/2021   GFRNONAA 54 (L) 05/02/2020   Lab Results  Component Value Date   CHOL 175 05/02/2021   HDL 47 05/02/2021   LDLCALC 106 (H) 05/02/2021   TRIG 120 05/02/2021   CHOLHDL 3.7 05/02/2021   Lab Results  Component Value Date   TSH 2.180 05/02/2021   Lab Results  Component Value Date   HGBA1C 5.8 (H) 04/13/2017   Lab Results  Component Value Date   WBC 5.8 05/02/2021   HGB 11.5 05/02/2021   HCT 33.4 (L) 05/02/2021   MCV 92 05/02/2021   PLT 246 05/02/2021   Lab Results  Component Value Date   ALT 16 05/02/2021   AST 21 05/02/2021   ALKPHOS 91 05/02/2021   BILITOT 0.5 05/02/2021   Lab Results  Component Value Date   VD25OH 38.3 05/02/2021     Review of Systems  Constitutional:  Positive for fatigue and malaise/fatigue. Negative for chills, fever and unexpected weight change.  Respiratory:  Positive for cough, chest tightness and shortness of breath. Negative for wheezing.    Cardiovascular:  Positive for chest pain and palpitations. Negative for leg swelling.  Gastrointestinal:  Negative for abdominal pain, nausea and vomiting.  Neurological:  Negative for dizziness, light-headedness and headaches.  Psychiatric/Behavioral:  Negative for dysphoric mood and sleep disturbance. The patient is not nervous/anxious.    Patient Active Problem List   Diagnosis Date Noted   Acute bacterial rhinosinusitis 11/01/2020   Aortic atherosclerosis ([place]) 10/15/2020   S/P parathyroidectomy ([place]) 10/15/2020   Anemia, vitamin B12 deficiency 06/24/2020   IPMN (intraductal papillary mucinous neoplasm) 06/06/2018   Postcholecystectomy diarrhea 06/06/2018   Plantar fascial fibromatosis 10/12/2017   Osteoarthritis of knee 10/12/2017   Thigh numbness 10/12/2017   OSA (obstructive sleep apnea) 09/18/2016   Chronic left shoulder pain 06/18/2016   Abdominal pain, chronic, right upper quadrant 04/29/2016   Extrahepatic biloma 04/15/2016   Age-related osteoporosis without current pathological fracture 04/06/2016   Allergic rhinitis 01/24/2015   Benign essential HTN 01/24/2015   Hyperlipidemia, mixed 01/24/2015   Gastroesophageal reflux disease 01/24/2015   Generalized OA 01/24/2015   Avitaminosis D 01/24/2015    Allergies  Allergen Reactions   Nitrofuran Derivatives Diarrhea   Amoxicillin Itching    Believes that was amoxicillin. Not 379% certain which antibiotic.     Hydrochlorothiazide     hypercalcemia   Prednisone Other (See Comments)    Gets  really hyper    Past Surgical History:  Procedure Laterality Date   CARPAL TUNNEL RELEASE Right    CATARACT EXTRACTION     CHOLECYSTECTOMY  2013   PARATHYROIDECTOMY  08/21/2020   removed one gland   TOTAL SHOULDER REPLACEMENT Left 2013    Social History   Tobacco Use   Smoking status: Former    Types: Cigarettes    Quit date: 07/06/1996    Years since quitting: 25.0   Smokeless tobacco: Never  Vaping Use   Vaping Use:  Never used  Substance Use Topics   Alcohol use: Yes    Alcohol/week: 3.0 standard drinks    Types: 3 Glasses of wine per week   Drug use: Never     Medication list has been reviewed and updated.  Current Meds  Medication Sig   acetaminophen (TYLENOL) 500 MG tablet Take 1,000 mg by mouth every 6 (six) hours as needed.   alendronate (FOSAMAX) 70 MG tablet Take 1 tablet (70 mg total) by mouth once a week.   Calcium Carbonate-Vit D-Min (CALTRATE 600+D PLUS PO) Take 2 tablets by mouth daily at 2 PM.   cefdinir (OMNICEF) 300 MG capsule Take 1 capsule (300 mg total) by mouth 2 (two) times daily for 10 days.   colestipol (COLESTID) 1 g tablet Take 1 g by mouth 2 (two) times daily. PRN   erythromycin ophthalmic ointment SMARTSIG:0.5 Inch(es) In Eye(s) 4 Times Daily   esomeprazole (NEXIUM) 40 MG capsule Take 40 mg by mouth daily at 12 noon.   fluticasone (FLONASE) 50 MCG/ACT nasal spray Place 1 spray into both nostrils daily. (Patient taking differently: Place 1 spray into both nostrils daily as needed.)   furosemide (LASIX) 20 MG tablet TAKE 1 TABLET(20 MG) BY MOUTH TWICE DAILY   ketoconazole (NIZORAL) 2 % cream Apply 1 application topically 2 (two) times daily.   meclizine (ANTIVERT) 12.5 MG tablet Take 1 tablet (12.5 mg total) by mouth 3 (three) times daily as needed for dizziness.   meloxicam (MOBIC) 15 MG tablet Take 1 tablet by mouth daily.   ondansetron (ZOFRAN) 4 MG tablet Take 1 tablet (4 mg total) by mouth every 8 (eight) hours as needed for nausea or vomiting.   Polyethyl Glycol-Propyl Glycol 0.4-0.3 % SOLN Place 1 drop into both eyes as needed.   traMADol (ULTRAM) 50 MG tablet Take 50 mg by mouth every 4 (four) hours as needed.    PHQ 2/9 Scores 07/15/2021 05/02/2021 11/05/2020 10/15/2020  PHQ - 2 Score 0 0 0 0  PHQ- 9 Score 2 0 1 0    GAD 7 : Generalized Anxiety Score 07/15/2021 05/02/2021 11/05/2020 10/15/2020  Nervous, Anxious, on Edge 0 0 0 0  Control/stop worrying 0 0 0 0  Worry  too much - different things 0 0 0 0  Trouble relaxing 0 0 0 0  Restless 0 0 0 0  Easily annoyed or irritable 0 0 0 0  Afraid - awful might happen 0 0 0 0  Total GAD 7 Score 0 0 0 0  Anxiety Difficulty Not difficult at all Not difficult at all Not difficult at all -    BP Readings from Last 3 Encounters:  07/15/21 139/85  05/21/21 138/78  05/02/21 130/76    Physical Exam Vitals and nursing note reviewed.  Constitutional:      General: She is not in acute distress.    Appearance: Normal appearance. She is well-developed.     Comments: Appears mildly fatigued  HENT:     Head: Normocephalic and atraumatic.  Cardiovascular:     Rate and Rhythm: Normal rate and regular rhythm. No extrasystoles are present.    Heart sounds: Normal heart sounds.  Pulmonary:     Effort: Pulmonary effort is normal. No respiratory distress.  Chest:     Chest wall: No tenderness or crepitus.  Abdominal:     Palpations: Abdomen is soft.     Tenderness: There is no right CVA tenderness, left CVA tenderness or guarding.  Musculoskeletal:     Cervical back: Normal range of motion.     Right lower leg: No edema.     Left lower leg: No edema.  Skin:    General: Skin is warm and dry.     Capillary Refill: Capillary refill takes less than 2 seconds.     Findings: No rash.  Neurological:     Mental Status: She is alert and oriented to person, place, and time.  Psychiatric:        Mood and Affect: Mood normal.        Behavior: Behavior normal.    Wt Readings from Last 3 Encounters:  07/15/21 171 lb 3.2 oz (77.7 kg)  05/21/21 177 lb (80.3 kg)  05/02/21 178 lb 3.2 oz (80.8 kg)    BP 139/85 (BP Location: Right Arm, Cuff Size: Normal)    Pulse 89    Ht _0  (1.676 m)    Wt 171 lb 3.2 oz (77.7 kg)    SpO2 96%    BMI 27.63 kg/m   Assessment and Plan: 1. Chest tightness Transient symptoms 2 days ago have not recurred. EKG is stable.   - EKG 12-Lead SR @ 25; old inf infarct.  2. SOB (shortness of  breath) Suspect bronchitis/pneumonia following viral infection over the holidays.  She did not test for Covid.  She travelled for several weeks around that time. Will start Cefdinir and get CXR and labs. Push fluids; go to ED if worsening. - CBC with Differential/Platelet - Comprehensive metabolic panel - DG Chest 2 View - cefdinir (OMNICEF) 300 MG capsule; Take 1 capsule (300 mg total) by mouth 2 (two) times daily for 10 days.  Dispense: 20 capsule; Refill: 0  3. Acute cystitis without hematuria Will send for culture. Cefdinir will also cover most urinary pathogens. - POCT urinalysis dipstick - Urine Culture - cefdinir (OMNICEF) 300 MG capsule; Take 1 capsule (300 mg total) by mouth 2 (two) times daily for 10 days.  Dispense: 20 capsule; Refill: 0   Partially dictated using Editor, commissioning. Any errors are unintentional.  Halina Maidens, MD [facility]  07/15/2021

## 2021-07-16 ENCOUNTER — Ambulatory Visit: Payer: Medicare Other

## 2021-07-18 LAB — URINE CULTURE

## 2021-07-24 ENCOUNTER — Telehealth: Payer: Self-pay | Admitting: Internal Medicine

## 2021-07-24 NOTE — Telephone Encounter (Signed)
Copied from [place] [id number]. Topic: Medicare AWV >> Jul 24, 2021  9:24 AM Cher Nakai R wrote: Reason for CRM:  No answer unable to leave a  message for patient to call back and schedule Medicare Annual Wellness Visit (AWV) in office.   If unable to come into the office for AWV,  please offer to do virtually or by telephone.  Last AWV: 06/12/2020  Please schedule at anytime with [facility] Health Advisor.      40 Minutes appointment   Any questions, please call me at [phone]

## 2021-07-31 DIAGNOSIS — M79605 Pain in left leg: Secondary | ICD-10-CM | POA: Diagnosis not present

## 2021-07-31 DIAGNOSIS — M19011 Primary osteoarthritis, right shoulder: Secondary | ICD-10-CM | POA: Diagnosis not present

## 2021-08-05 DIAGNOSIS — M79605 Pain in left leg: Secondary | ICD-10-CM | POA: Diagnosis not present

## 2021-09-01 DIAGNOSIS — M19011 Primary osteoarthritis, right shoulder: Secondary | ICD-10-CM | POA: Diagnosis not present

## 2021-09-04 DIAGNOSIS — E210 Primary hyperparathyroidism: Secondary | ICD-10-CM | POA: Diagnosis not present

## 2021-09-04 DIAGNOSIS — Z780 Asymptomatic menopausal state: Secondary | ICD-10-CM | POA: Diagnosis not present

## 2021-09-04 DIAGNOSIS — M810 Age-related osteoporosis without current pathological fracture: Secondary | ICD-10-CM | POA: Diagnosis not present

## 2021-09-04 DIAGNOSIS — Z87891 Personal history of nicotine dependence: Secondary | ICD-10-CM | POA: Diagnosis not present

## 2021-09-04 DIAGNOSIS — E892 Postprocedural hypoparathyroidism: Secondary | ICD-10-CM | POA: Diagnosis not present

## 2021-09-04 LAB — HM DEXA SCAN

## 2021-09-22 ENCOUNTER — Other Ambulatory Visit: Payer: Self-pay

## 2021-09-22 ENCOUNTER — Ambulatory Visit
Admission: RE | Admit: 2021-09-22 | Discharge: 2021-09-22 | Disposition: A | Discharge status: Home / Self Care | Payer: Medicare Other | Source: Ambulatory Visit | Attending: Internal Medicine | Admitting: Internal Medicine

## 2021-09-22 DIAGNOSIS — Z1231 Encounter for screening mammogram for malignant neoplasm of breast: Secondary | ICD-10-CM | POA: Diagnosis not present

## 2021-10-02 ENCOUNTER — Ambulatory Visit (INDEPENDENT_AMBULATORY_CARE_PROVIDER_SITE_OTHER): Payer: Medicare Other | Admitting: Internal Medicine

## 2021-10-02 ENCOUNTER — Encounter: Payer: Self-pay | Admitting: Internal Medicine

## 2021-10-02 VITALS — BP 118/54 | HR 81 | Ht 66.0 in | Wt 177.0 lb

## 2021-10-02 DIAGNOSIS — N644 Mastodynia: Secondary | ICD-10-CM | POA: Diagnosis not present

## 2021-10-02 MED ORDER — SULFAMETHOXAZOLE-TRIMETHOPRIM 800-160 MG PO TABS: 1.0000 | ORAL_TABLET | Freq: Two times a day (BID) | ORAL | 0 refills | Status: AC

## 2021-10-02 NOTE — Progress Notes (Signed)
? ? ?Date:  10/02/2021  ? ?Name:  Joanna Sanders   DOB:  10-22-1929   MRN:  [id number] ? ? ?Chief Complaint: Breast Pain (6 hours, Left breast, painful to touch, no redness) ? ?HPI ?Breast pain - this am her left breast was very tender to touch.  It has persisted and is painful with any pressure.  She denies injury, increase in caffeine intake, hormonal therapy.  She had a normal mammogram three weeks ago. ?Lab Results  ?Component Value Date  ? NA 131 (L) 07/15/2021  ? K 3.7 07/15/2021  ? CO2 25 07/15/2021  ? GLUCOSE 100 (H) 07/15/2021  ? BUN 15 07/15/2021  ? CREATININE 0.93 07/15/2021  ? CALCIUM 9.3 07/15/2021  ? EGFR 54 (L) 05/02/2021  ? GFRNONAA 59 (L) 07/15/2021  ? ?Lab Results  ?Component Value Date  ? CHOL 175 05/02/2021  ? HDL 47 05/02/2021  ? [place] 106 (H) 05/02/2021  ? TRIG 120 05/02/2021  ? CHOLHDL 3.7 05/02/2021  ? ?Lab Results  ?Component Value Date  ? TSH 2.180 05/02/2021  ? ?Lab Results  ?Component Value Date  ? HGBA1C 5.8 (H) 04/13/2017  ? ?Lab Results  ?Component Value Date  ? WBC 7.5 07/15/2021  ? HGB 10.3 (L) 07/15/2021  ? HCT 32.8 (L) 07/15/2021  ? MCV 89.9 07/15/2021  ? PLT 313 07/15/2021  ? ?Lab Results  ?Component Value Date  ? ALT 15 07/15/2021  ? AST 19 07/15/2021  ? ALKPHOS 74 07/15/2021  ? BILITOT 0.6 07/15/2021  ? ?Lab Results  ?Component Value Date  ? VD25OH 38.3 05/02/2021  ?  ? ?Review of Systems  ?Constitutional:  Negative for chills and fever.  ?Genitourinary:   ?     Left breast pain; no nipple discharge, no rash, no trauma  ? ?Patient Active Problem List  ? Diagnosis Date Noted  ? Acute bacterial rhinosinusitis 11/01/2020  ? Aortic atherosclerosis ([place]) 10/15/2020  ? S/P parathyroidectomy ([place]) 10/15/2020  ? Anemia, vitamin B12 deficiency 06/24/2020  ? IPMN (intraductal papillary mucinous neoplasm) 06/06/2018  ? Postcholecystectomy diarrhea 06/06/2018  ? Plantar fascial fibromatosis 10/12/2017  ? Osteoarthritis of knee 10/12/2017  ? Thigh numbness 10/12/2017  ? OSA (obstructive  sleep apnea) 09/18/2016  ? Chronic left shoulder pain 06/18/2016  ? Abdominal pain, chronic, right upper quadrant 04/29/2016  ? Extrahepatic biloma 04/15/2016  ? Age-related osteoporosis without current pathological fracture 04/06/2016  ? Allergic rhinitis 01/24/2015  ? Benign essential HTN 01/24/2015  ? Hyperlipidemia, mixed 01/24/2015  ? Gastroesophageal reflux disease 01/24/2015  ? Generalized OA 01/24/2015  ? Avitaminosis D 01/24/2015  ? ? ?Allergies  ?Allergen Reactions  ? Nitrofuran Derivatives Diarrhea  ? Amoxicillin Itching  ?  Believes that was amoxicillin. Not 009% certain which antibiotic.  ?  ? Hydrochlorothiazide   ?  hypercalcemia  ? Prednisone Other (See Comments)  ?  Gets really hyper  ? ? ?Past Surgical History:  ?Procedure Laterality Date  ? CARPAL TUNNEL RELEASE Right   ? CATARACT EXTRACTION    ? CHOLECYSTECTOMY  2013  ? PARATHYROIDECTOMY  08/21/2020  ? removed one gland  ? TOTAL SHOULDER REPLACEMENT Left 2013  ? ? ?Social History  ? ?Tobacco Use  ? Smoking status: Former  ?  Types: Cigarettes  ?  Quit date: 07/06/1996  ?  Years since quitting: 25.2  ? Smokeless tobacco: Never  ?Vaping Use  ? Vaping Use: Never used  ?Substance Use Topics  ? Alcohol use: Yes  ?  Alcohol/week: 3.0 standard drinks  ?  Types: 3 Glasses of wine per week  ? Drug use: Never  ? ? ? ?Medication list has been reviewed and updated. ? ?Current Meds  ?Medication Sig  ? acetaminophen (TYLENOL) 500 MG tablet Take 1,000 mg by mouth every 6 (six) hours as needed.  ? Calcium Carbonate-Vit D-Min (CALTRATE 600+D PLUS PO) Take 2 tablets by mouth daily at 2 PM.  ? colestipol (COLESTID) 1 g tablet Take 1 g by mouth 2 (two) times daily. PRN  ? erythromycin ophthalmic ointment SMARTSIG:0.5 Inch(es) In Eye(s) 4 Times Daily  ? esomeprazole (NEXIUM) 40 MG capsule Take 40 mg by mouth daily at 12 noon.  ? furosemide (LASIX) 20 MG tablet TAKE 1 TABLET(20 MG) BY MOUTH TWICE DAILY (Patient taking differently: as needed. 1-2 daily as needed)  ?  ketoconazole (NIZORAL) 2 % cream Apply 1 application topically 2 (two) times daily.  ? meclizine (ANTIVERT) 12.5 MG tablet Take 1 tablet (12.5 mg total) by mouth 3 (three) times daily as needed for dizziness.  ? meloxicam (MOBIC) 15 MG tablet Take 1 tablet by mouth daily.  ? ondansetron (ZOFRAN) 4 MG tablet Take 1 tablet (4 mg total) by mouth every 8 (eight) hours as needed for nausea or vomiting.  ? sulfamethoxazole-trimethoprim (BACTRIM DS) 800-160 MG tablet Take 1 tablet by mouth 2 (two) times daily for 10 days.  ? traMADol (ULTRAM) 50 MG tablet Take 50 mg by mouth every 4 (four) hours as needed.  ? [DISCONTINUED] alendronate (FOSAMAX) 70 MG tablet Take 1 tablet (70 mg total) by mouth once a week.  ? [DISCONTINUED] fluticasone (FLONASE) 50 MCG/ACT nasal spray Place 1 spray into both nostrils daily. (Patient taking differently: Place 1 spray into both nostrils daily as needed.)  ? ? ? ?  10/02/2021  ? 10:01 AM 07/15/2021  ?  1:36 PM 05/02/2021  ? 10:47 AM 11/05/2020  ? 10:51 AM  ?GAD 7 : Generalized Anxiety Score  ?Nervous, Anxious, on Edge 0 0 0 0  ?Control/stop worrying 0 0 0 0  ?Worry too much - different things 0 0 0 0  ?Trouble relaxing 0 0 0 0  ?Restless 0 0 0 0  ?Easily annoyed or irritable 0 0 0 0  ?Afraid - awful might happen 0 0 0 0  ?Total GAD 7 Score 0 0 0 0  ?Anxiety Difficulty  Not difficult at all Not difficult at all Not difficult at all  ? ? ? ?  10/02/2021  ? 10:00 AM  ?Depression screen PHQ 2/9  ?Decreased Interest 0  ?Down, Depressed, Hopeless 0  ?PHQ - 2 Score 0  ?Altered sleeping 0  ?Tired, decreased energy 0  ?Change in appetite 0  ?Feeling bad or failure about yourself  0  ?Trouble concentrating 0  ?Moving slowly or fidgety/restless 0  ?Suicidal thoughts 0  ?PHQ-9 Score 0  ?Difficult doing work/chores Not difficult at all  ? ? ?BP Readings from Last 3 Encounters:  ?10/02/21 (!) 118/54  ?07/15/21 139/85  ?05/21/21 138/78  ? ? ?Physical Exam ?Vitals and nursing note reviewed.  ?Constitutional:   ?    General: She is not in acute distress. ?   Appearance: She is well-developed.  ?HENT:  ?   Head: Normocephalic and atraumatic.  ?Cardiovascular:  ?   Rate and Rhythm: Normal rate and regular rhythm.  ?Pulmonary:  ?   Effort: Pulmonary effort is normal. No respiratory distress.  ?   Breath sounds: Normal breath sounds.  ?Chest:  ?Breasts: ?   Left:  Tenderness present. No swelling, bleeding, inverted nipple, mass or nipple discharge.  ?   Comments: Left breast very tender to palpation throughout - greater in the outer breast.  No nipple discharge.  Overall the breast may be slightly more pink in color than the right. ?No mass is appreciated. ?Skin: ?   General: Skin is warm and dry.  ?   Findings: No rash.  ?Neurological:  ?   Mental Status: She is alert and oriented to person, place, and time.  ?Psychiatric:     ?   Mood and Affect: Mood normal.     ?   Behavior: Behavior normal.  ? ? ?Wt Readings from Last 3 Encounters:  ?10/02/21 177 lb (80.3 kg)  ?07/15/21 171 lb 3.2 oz (77.7 kg)  ?05/21/21 177 lb (80.3 kg)  ? ? ?BP (!) 118/54   Pulse 81   Ht '5\' 6"'  (1.676 m)   Wt 177 lb (80.3 kg)   SpO2 96%   BMI 28.57 kg/m?  ? ?Assessment and Plan: ?1. Acute breast pain ?May be early mastitis triggered by mammogram compression. ?Recent mammogram is reassuring. ?Take Mobic daily and will cover for bacterial pathogens with Bactrim. ?Follow up if symptoms worsen. ?- sulfamethoxazole-trimethoprim (BACTRIM DS) 800-160 MG tablet; Take 1 tablet by mouth 2 (two) times daily for 10 days.  Dispense: 20 tablet; Refill: 0 ? ? ?Partially dictated using Editor, commissioning. Any errors are unintentional. ? ?Halina Maidens, MD ?[facility] ?[facility] ? ?10/02/2021 ? ? ? ? ? ?

## 2021-10-31 ENCOUNTER — Ambulatory Visit (INDEPENDENT_AMBULATORY_CARE_PROVIDER_SITE_OTHER): Payer: Medicare Other | Admitting: Internal Medicine

## 2021-10-31 ENCOUNTER — Encounter: Payer: Self-pay | Admitting: Internal Medicine

## 2021-10-31 VITALS — BP 126/74 | HR 75 | Ht 66.0 in | Wt 175.0 lb

## 2021-10-31 DIAGNOSIS — E892 Postprocedural hypoparathyroidism: Secondary | ICD-10-CM

## 2021-10-31 DIAGNOSIS — I10 Essential (primary) hypertension: Secondary | ICD-10-CM

## 2021-10-31 DIAGNOSIS — M19012 Primary osteoarthritis, left shoulder: Secondary | ICD-10-CM | POA: Diagnosis not present

## 2021-10-31 DIAGNOSIS — D649 Anemia, unspecified: Secondary | ICD-10-CM

## 2021-10-31 DIAGNOSIS — I700 Atherosclerosis of aorta: Secondary | ICD-10-CM

## 2021-10-31 NOTE — Progress Notes (Signed)
? ? ?Date:  10/31/2021  ? ?Name:  Joanna Sanders   DOB:  1929/11/22   MRN:  [id number] ? ? ?Chief Complaint: Hypertension ? ?Hypertension ?This is a chronic problem. The problem is controlled. Pertinent negatives include no chest pain, headaches, palpitations or shortness of breath. Past treatments include diuretics. Hypertensive end-organ damage includes kidney disease. There is no history of CAD/MI or CVA.  ?Anemia ?Presents for follow-up visit. There has been no abdominal pain, fever, leg swelling, light-headedness, palpitations or weight loss. (taking iron twice a day after the [employer] [facility] said her Hgb was too low to donate.  last check here 10.3)  ?There are no compliance problems.   ?Breast pain - was seen several weeks ago with left breast pain.  It gradually resolved over the following 5 days.  Now she feels well. ? ?Lab Results  ?Component Value Date  ? NA 131 (L) 07/15/2021  ? K 3.7 07/15/2021  ? CO2 25 07/15/2021  ? GLUCOSE 100 (H) 07/15/2021  ? BUN 15 07/15/2021  ? CREATININE 0.93 07/15/2021  ? CALCIUM 9.3 07/15/2021  ? EGFR 54 (L) 05/02/2021  ? GFRNONAA 59 (L) 07/15/2021  ? ?Lab Results  ?Component Value Date  ? CHOL 175 05/02/2021  ? HDL 47 05/02/2021  ? [place] 106 (H) 05/02/2021  ? TRIG 120 05/02/2021  ? CHOLHDL 3.7 05/02/2021  ? ?Lab Results  ?Component Value Date  ? TSH 2.180 05/02/2021  ? ?Lab Results  ?Component Value Date  ? HGBA1C 5.8 (H) 04/13/2017  ? ?Lab Results  ?Component Value Date  ? WBC 7.5 07/15/2021  ? HGB 10.3 (L) 07/15/2021  ? HCT 32.8 (L) 07/15/2021  ? MCV 89.9 07/15/2021  ? PLT 313 07/15/2021  ? ?Lab Results  ?Component Value Date  ? ALT 15 07/15/2021  ? AST 19 07/15/2021  ? ALKPHOS 74 07/15/2021  ? BILITOT 0.6 07/15/2021  ? ?Lab Results  ?Component Value Date  ? VD25OH 38.3 05/02/2021  ?  ? ?Review of Systems  ?Constitutional:  Negative for fatigue, fever, unexpected weight change and weight loss.  ?HENT:  Negative for nosebleeds.   ?Eyes:  Negative for visual disturbance.   ?Respiratory:  Negative for cough, chest tightness, shortness of breath and wheezing.   ?Cardiovascular:  Negative for chest pain, palpitations and leg swelling.  ?Gastrointestinal:  Negative for abdominal pain, constipation and diarrhea.  ?Musculoskeletal:  Positive for arthralgias and gait problem.  ?Neurological:  Negative for dizziness, weakness, light-headedness and headaches.  ? ?Patient Active Problem List  ? Diagnosis Date Noted  ? Aortic atherosclerosis ([place]) 10/15/2020  ? S/P parathyroidectomy ([place]) 10/15/2020  ? Anemia, vitamin B12 deficiency 06/24/2020  ? IPMN (intraductal papillary mucinous neoplasm) 06/06/2018  ? Postcholecystectomy diarrhea 06/06/2018  ? Plantar fascial fibromatosis 10/12/2017  ? Osteoarthritis of knee 10/12/2017  ? Thigh numbness 10/12/2017  ? OSA (obstructive sleep apnea) 09/18/2016  ? Chronic left shoulder pain 06/18/2016  ? Abdominal pain, chronic, right upper quadrant 04/29/2016  ? Extrahepatic biloma 04/15/2016  ? Age-related osteoporosis without current pathological fracture 04/06/2016  ? Allergic rhinitis 01/24/2015  ? Benign essential HTN 01/24/2015  ? Hyperlipidemia, mixed 01/24/2015  ? Gastroesophageal reflux disease 01/24/2015  ? Generalized OA 01/24/2015  ? Avitaminosis D 01/24/2015  ? ? ?Allergies  ?Allergen Reactions  ? Nitrofuran Derivatives Diarrhea  ? Amoxicillin Itching  ?  Believes that was amoxicillin. Not 381% certain which antibiotic.  ?  ? Hydrochlorothiazide   ?  hypercalcemia  ? Prednisone Other (See Comments)  ?  Gets really hyper  ? ? ?Past Surgical History:  ?Procedure Laterality Date  ? CARPAL TUNNEL RELEASE Right   ? CATARACT EXTRACTION    ? CHOLECYSTECTOMY  2013  ? PARATHYROIDECTOMY  08/21/2020  ? removed one gland  ? TOTAL SHOULDER REPLACEMENT Left 2013  ? ? ?Social History  ? ?Tobacco Use  ? Smoking status: Former  ?  Types: Cigarettes  ?  Quit date: 07/06/1996  ?  Years since quitting: 25.3  ? Smokeless tobacco: Never  ?Vaping Use  ? Vaping Use: Never  used  ?Substance Use Topics  ? Alcohol use: Yes  ?  Alcohol/week: 3.0 standard drinks  ?  Types: 3 Glasses of wine per week  ? Drug use: Never  ? ? ? ?Medication list has been reviewed and updated. ? ?Current Meds  ?Medication Sig  ? acetaminophen (TYLENOL) 500 MG tablet Take 1,000 mg by mouth every 6 (six) hours as needed.  ? Calcium Carbonate-Vit D-Min (CALTRATE 600+D PLUS PO) Take 2 tablets by mouth daily at 2 PM.  ? colestipol (COLESTID) 1 g tablet Take 1 g by mouth 2 (two) times daily. PRN  ? esomeprazole (NEXIUM) 40 MG capsule Take 40 mg by mouth daily at 12 noon.  ? Ferrous Sulfate (IRON PO) Take by mouth daily.  ? furosemide (LASIX) 20 MG tablet TAKE 1 TABLET(20 MG) BY MOUTH TWICE DAILY (Patient taking differently: as needed. 1-2 daily as needed)  ? meclizine (ANTIVERT) 12.5 MG tablet Take 1 tablet (12.5 mg total) by mouth 3 (three) times daily as needed for dizziness.  ? meloxicam (MOBIC) 15 MG tablet Take 1 tablet by mouth daily.  ? ondansetron (ZOFRAN) 4 MG tablet Take 1 tablet (4 mg total) by mouth every 8 (eight) hours as needed for nausea or vomiting.  ? traMADol (ULTRAM) 50 MG tablet Take 50 mg by mouth every 4 (four) hours as needed.  ? [DISCONTINUED] erythromycin ophthalmic ointment SMARTSIG:0.5 Inch(es) In Eye(s) 4 Times Daily  ? ? ? ?  10/31/2021  ?  1:39 PM 10/02/2021  ? 10:01 AM 07/15/2021  ?  1:36 PM 05/02/2021  ? 10:47 AM  ?GAD 7 : Generalized Anxiety Score  ?Nervous, Anxious, on Edge 0 0 0 0  ?Control/stop worrying 0 0 0 0  ?Worry too much - different things 0 0 0 0  ?Trouble relaxing 0 0 0 0  ?Restless 0 0 0 0  ?Easily annoyed or irritable 0 0 0 0  ?Afraid - awful might happen 0 0 0 0  ?Total GAD 7 Score 0 0 0 0  ?Anxiety Difficulty   Not difficult at all Not difficult at all  ? ? ? ?  10/31/2021  ?  1:39 PM  ?Depression screen PHQ 2/9  ?Decreased Interest 0  ?Down, Depressed, Hopeless 0  ?PHQ - 2 Score 0  ?Altered sleeping 1  ?Tired, decreased energy 1  ?Change in appetite 0  ?Feeling bad or  failure about yourself  0  ?Trouble concentrating 0  ?Moving slowly or fidgety/restless 0  ?Suicidal thoughts 0  ?PHQ-9 Score 2  ?Difficult doing work/chores Not difficult at all  ? ? ?BP Readings from Last 3 Encounters:  ?10/31/21 126/74  ?10/02/21 (!) 118/54  ?07/15/21 139/85  ? ? ?Physical Exam ?Vitals and nursing note reviewed.  ?Constitutional:   ?   General: She is not in acute distress. ?   Appearance: She is well-developed.  ?HENT:  ?   Head: Normocephalic and atraumatic.  ?Cardiovascular:  ?  Rate and Rhythm: Normal rate and regular rhythm.  ?   Heart sounds: No murmur heard. ?Pulmonary:  ?   Effort: Pulmonary effort is normal. No respiratory distress.  ?   Breath sounds: No wheezing or rhonchi.  ?Musculoskeletal:  ?   Cervical back: Normal range of motion and neck supple.  ?   Right lower leg: No edema.  ?   Left lower leg: No edema.  ?Skin: ?   General: Skin is warm and dry.  ?   Findings: No rash.  ?Neurological:  ?   Mental Status: She is alert and oriented to person, place, and time.  ?Psychiatric:     ?   Mood and Affect: Mood normal.     ?   Behavior: Behavior normal.  ? ? ?Wt Readings from Last 3 Encounters:  ?10/31/21 175 lb (79.4 kg)  ?10/02/21 177 lb (80.3 kg)  ?07/15/21 171 lb 3.2 oz (77.7 kg)  ? ? ?BP 126/74   Pulse 75   Ht '5\' 6"'  (1.676 m)   Wt 175 lb (79.4 kg)   SpO2 97%   BMI 28.25 kg/m?  ? ?Assessment and Plan: ?1. Benign essential HTN ?Clinically stable exam with well controlled BP. ?Tolerating medications without side effects at this time. ?Pt to continue current regimen and low sodium diet; benefits of regular exercise as able discussed. ? ?2. Localized primary osteoarthritis of left shoulder region ?She can continue steroid injections every three months to avoid surgery ?Continue Mobic and PRN tylenol ? ?3. Anemia, unspecified type ?Continue iron - recheck CBC ?- CBC with Differential/Platelet ? ?4. Aortic atherosclerosis ([place]) ?Declines statin - on Colestid for diarrhea which may  help ? ?5. S/P parathyroidectomy ([place]) ?Doing well. ?Last calcium was 9.3 ? ? ?Partially dictated using Editor, commissioning. Any errors are unintentional. ? ?Halina Maidens, MD ?[facility] ?[place]

## 2021-11-01 LAB — CBC WITH DIFFERENTIAL/PLATELET
Basophils Absolute: 0.1 10*3/uL (ref 0.0–0.2)
Basos: 1 %
EOS (ABSOLUTE): 0.2 10*3/uL (ref 0.0–0.4)
Eos: 3 %
Hematocrit: 32.7 % — ABNORMAL LOW (ref 34.0–46.6)
Hemoglobin: 11.4 g/dL (ref 11.1–15.9)
Immature Grans (Abs): 0 10*3/uL (ref 0.0–0.1)
Immature Granulocytes: 0 %
Lymphocytes Absolute: 2.4 10*3/uL (ref 0.7–3.1)
Lymphs: 34 %
MCH: 30.2 pg (ref 26.6–33.0)
MCHC: 34.9 g/dL (ref 31.5–35.7)
MCV: 87 fL (ref 79–97)
Monocytes Absolute: 0.8 10*3/uL (ref 0.1–0.9)
Monocytes: 11 %
Neutrophils Absolute: 3.6 10*3/uL (ref 1.4–7.0)
Neutrophils: 51 %
Platelets: 276 10*3/uL (ref 150–450)
RBC: 3.78 x10E6/uL (ref 3.77–5.28)
RDW: 15.6 % — ABNORMAL HIGH (ref 11.7–15.4)
WBC: 7 10*3/uL (ref 3.4–10.8)

## 2021-11-03 DIAGNOSIS — M19011 Primary osteoarthritis, right shoulder: Secondary | ICD-10-CM | POA: Diagnosis not present

## 2021-11-18 DIAGNOSIS — Z961 Presence of intraocular lens: Secondary | ICD-10-CM | POA: Diagnosis not present

## 2021-11-18 DIAGNOSIS — Z83511 Family history of glaucoma: Secondary | ICD-10-CM | POA: Diagnosis not present

## 2021-11-18 DIAGNOSIS — H518 Other specified disorders of binocular movement: Secondary | ICD-10-CM | POA: Diagnosis not present

## 2021-11-18 DIAGNOSIS — H532 Diplopia: Secondary | ICD-10-CM | POA: Diagnosis not present

## 2021-11-19 ENCOUNTER — Telehealth: Payer: Self-pay | Admitting: Internal Medicine

## 2021-11-19 NOTE — Telephone Encounter (Signed)
Copied from [place] [phone]. Topic: Medicare AWV ?>> Nov 19, 2021 11:11 AM Cher Nakai R wrote: ?Reason for CRM:  ?Left message for patient to call back and schedule Medicare Annual Wellness Visit (AWV) in office.  ? ?If unable to come into the office for AWV,  please offer to do virtually or by telephone. ? ?Last AWV: 06/12/2020 ? ?Please schedule at anytime with [facility] Health Advisor.     ? ?30 minute appointment for Virtual or phone ?45 minute appointment for in office or Initial virtual/phone ? ?Any questions, please call me at [phone] ?

## 2021-12-08 ENCOUNTER — Ambulatory Visit (INDEPENDENT_AMBULATORY_CARE_PROVIDER_SITE_OTHER): Payer: Medicare Other

## 2021-12-08 DIAGNOSIS — Z0000 Encounter for general adult medical examination without abnormal findings: Secondary | ICD-10-CM | POA: Diagnosis not present

## 2021-12-08 NOTE — Patient Instructions (Signed)
Joanna Sanders , Thank you for taking time to come for your Medicare Wellness Visit. I appreciate your ongoing commitment to your health goals. Please review the following plan we discussed and let me know if I can assist you in the future.   Screening recommendations/referrals: Colonoscopy: no longer required Mammogram: done 09/22/21 Bone Density: done 09/04/21 Recommended yearly ophthalmology/optometry visit for glaucoma screening and checkup Recommended yearly dental visit for hygiene and checkup  Vaccinations: Influenza vaccine: done 05/02/21 Pneumococcal vaccine: done 03/20/14 Tdap vaccine: done 09/26/15 Shingles vaccine: done 06/23/18 & 09/08/18   Covid-19:done 07/17/19, 08/07/19, 04/25/20, 04/30/21  Advanced directives: Please bring a copy of your health care power of attorney and living will to the office at your convenience.   Conditions/risks identified: Keep up the great work!  Next appointment: Follow up in one year for your annual wellness visit    Preventive Care 65 Years and Older, Female Preventive care refers to lifestyle choices and visits with your health care provider that can promote health and wellness. What does preventive care include? A yearly physical exam. This is also called an annual well check. Dental exams once or twice a year. Routine eye exams. Ask your health care provider how often you should have your eyes checked. Personal lifestyle choices, including: Daily care of your teeth and gums. Regular physical activity. Eating a healthy diet. Avoiding tobacco and drug use. Limiting alcohol use. Practicing safe sex. Taking low-dose aspirin every day. Taking vitamin and mineral supplements as recommended by your health care provider. What happens during an annual well check? The services and screenings done by your health care provider during your annual well check will depend on your age, overall health, lifestyle risk factors, and family history of  disease. Counseling  Your health care provider may ask you questions about your: Alcohol use. Tobacco use. Drug use. Emotional well-being. Home and relationship well-being. Sexual activity. Eating habits. History of falls. Memory and ability to understand (cognition). Work and work Statistician. Reproductive health. Screening  You may have the following tests or measurements: Height, weight, and BMI. Blood pressure. Lipid and cholesterol levels. These may be checked every 5 years, or more frequently if you are over 82 years old. Skin check. Lung cancer screening. You may have this screening every year starting at age 52 if you have a 30-pack-year history of smoking and currently smoke or have quit within the past 15 years. Fecal occult blood test (FOBT) of the stool. You may have this test every year starting at age 63. Flexible sigmoidoscopy or colonoscopy. You may have a sigmoidoscopy every 5 years or a colonoscopy every 10 years starting at age 9. Hepatitis C blood test. Hepatitis B blood test. Sexually transmitted disease (STD) testing. Diabetes screening. This is done by checking your blood sugar (glucose) after you have not eaten for a while (fasting). You may have this done every 1-3 years. Bone density scan. This is done to screen for osteoporosis. You may have this done starting at age 44. Mammogram. This may be done every 1-2 years. Talk to your health care provider about how often you should have regular mammograms. Talk with your health care provider about your test results, treatment options, and if necessary, the need for more tests. Vaccines  Your health care provider may recommend certain vaccines, such as: Influenza vaccine. This is recommended every year. Tetanus, diphtheria, and acellular pertussis (Tdap, Td) vaccine. You may need a Td booster every 10 years. Zoster vaccine. You may need this  after age 69. Pneumococcal 13-valent conjugate (PCV13) vaccine. One  dose is recommended after age 55. Pneumococcal polysaccharide (PPSV23) vaccine. One dose is recommended after age 54. Talk to your health care provider about which screenings and vaccines you need and how often you need them. This information is not intended to replace advice given to you by your health care provider. Make sure you discuss any questions you have with your health care provider. Document Released: 07/19/2015 Document Revised: 03/11/2016 Document Reviewed: 04/23/2015 Elsevier Interactive Patient Education  2017 [place] Prevention in the Home Falls can cause injuries. They can happen to people of all ages. There are many things you can do to make your home safe and to help prevent falls. What can I do on the outside of my home? Regularly fix the edges of walkways and driveways and fix any cracks. Remove anything that might make you trip as you walk through a door, such as a raised step or threshold. Trim any bushes or trees on the path to your home. Use bright outdoor lighting. Clear any walking paths of anything that might make someone trip, such as rocks or tools. Regularly check to see if handrails are loose or broken. Make sure that both sides of any steps have handrails. Any raised decks and porches should have guardrails on the edges. Have any leaves, snow, or ice cleared regularly. Use sand or salt on walking paths during winter. Clean up any spills in your garage right away. This includes oil or grease spills. What can I do in the bathroom? Use night lights. Install grab bars by the toilet and in the tub and shower. Do not use towel bars as grab bars. Use non-skid mats or decals in the tub or shower. If you need to sit down in the shower, use a plastic, non-slip stool. Keep the floor dry. Clean up any water that spills on the floor as soon as it happens. Remove soap buildup in the tub or shower regularly. Attach bath mats securely with double-sided  non-slip rug tape. Do not have throw rugs and other things on the floor that can make you trip. What can I do in the bedroom? Use night lights. Make sure that you have a light by your bed that is easy to reach. Do not use any sheets or blankets that are too big for your bed. They should not hang down onto the floor. Have a firm chair that has side arms. You can use this for support while you get dressed. Do not have throw rugs and other things on the floor that can make you trip. What can I do in the kitchen? Clean up any spills right away. Avoid walking on wet floors. Keep items that you use a lot in easy-to-reach places. If you need to reach something above you, use a strong step stool that has a grab bar. Keep electrical cords out of the way. Do not use floor polish or wax that makes floors slippery. If you must use wax, use non-skid floor wax. Do not have throw rugs and other things on the floor that can make you trip. What can I do with my stairs? Do not leave any items on the stairs. Make sure that there are handrails on both sides of the stairs and use them. Fix handrails that are broken or loose. Make sure that handrails are as long as the stairways. Check any carpeting to make sure that it is firmly attached to the  stairs. Fix any carpet that is loose or worn. Avoid having throw rugs at the top or bottom of the stairs. If you do have throw rugs, attach them to the floor with carpet tape. Make sure that you have a light switch at the top of the stairs and the bottom of the stairs. If you do not have them, ask someone to add them for you. What else can I do to help prevent falls? Wear shoes that: Do not have high heels. Have rubber bottoms. Are comfortable and fit you well. Are closed at the toe. Do not wear sandals. If you use a stepladder: Make sure that it is fully opened. Do not climb a closed stepladder. Make sure that both sides of the stepladder are locked into place. Ask  someone to hold it for you, if possible. Clearly mark and make sure that you can see: Any grab bars or handrails. First and last steps. Where the edge of each step is. Use tools that help you move around (mobility aids) if they are needed. These include: Canes. Walkers. Scooters. Crutches. Turn on the lights when you go into a dark area. Replace any light bulbs as soon as they burn out. Set up your furniture so you have a clear path. Avoid moving your furniture around. If any of your floors are uneven, fix them. If there are any pets around you, be aware of where they are. Review your medicines with your doctor. Some medicines can make you feel dizzy. This can increase your chance of falling. Ask your doctor what other things that you can do to help prevent falls. This information is not intended to replace advice given to you by your health care provider. Make sure you discuss any questions you have with your health care provider. Document Released: 04/18/2009 Document Revised: 11/28/2015 Document Reviewed: 07/27/2014 Elsevier Interactive Patient Education  2017 [employer].

## 2021-12-08 NOTE — Progress Notes (Signed)
Subjective:   Joanna Sanders is a 88 y.o. female who presents for Medicare Annual (Subsequent) preventive examination.  Virtual Visit via Telephone Note  I connected with  Joanna Sanders on 12/08/21 at  1:00 PM EDT by telephone and verified that I am speaking with the correct person using two identifiers.  Location: Patient: home Provider: [facility] Persons participating in the virtual visit: [place]   I discussed the limitations, risks, security and privacy concerns of performing an evaluation and management service by telephone and the availability of in person appointments. The patient expressed understanding and agreed to proceed.  Interactive audio and video telecommunications were attempted between this nurse and patient, however failed, due to patient having technical difficulties OR patient did not have access to video capability.  We continued and completed visit with audio only.  Some vital signs may be absent or patient reported.   Clemetine Marker, LPN   Review of Systems     Cardiac Risk Factors include: advanced age (>29mn, >>74women)     Objective:    Today's Vitals   12/08/21 1307  PainSc: 4    There is no height or weight on file to calculate BMI.     12/08/2021    1:13 PM 06/12/2020   10:29 AM 04/26/2019    2:24 PM 02/22/2017    2:34 PM 04/04/2015   11:01 AM 01/25/2015   10:22 AM  Advanced Directives  Does Patient Have a Medical Advance Directive? Yes Yes Yes Yes Yes Yes  Type of AParamedicof [place]Living will [place]Living will HP[place]Living will HOhkay OwingehLiving will HCarthageLiving will HNew MorganLiving will;Out of facility DNR (pink MOST or yellow form)  Copy of HViera [place] Chart? No - copy requested No - copy requested No - copy requested No - copy requested  No - copy requested     Current Medications (verified) Outpatient Encounter Medications as of 12/08/2021  Medication Sig   acetaminophen (TYLENOL) 500 MG tablet Take 1,000 mg by mouth every 6 (six) hours as needed.   Calcium Carbonate-Vit D-Min (CALTRATE 600+D PLUS PO) Take 2 tablets by mouth daily at 2 PM.   colestipol (COLESTID) 1 g tablet Take 1 g by mouth 2 (two) times daily. PRN   esomeprazole (NEXIUM) 40 MG capsule Take 40 mg by mouth daily at 12 noon.   Ferrous Sulfate (IRON PO) Take by mouth daily.   furosemide (LASIX) 20 MG tablet TAKE 1 TABLET(20 MG) BY MOUTH TWICE DAILY (Patient taking differently: as needed. 1-2 daily as needed)   ketoconazole (NIZORAL) 2 % cream Apply 1 application. topically 2 (two) times daily.   meclizine (ANTIVERT) 12.5 MG tablet Take 1 tablet (12.5 mg total) by mouth 3 (three) times daily as needed for dizziness.   meloxicam (MOBIC) 15 MG tablet Take 1 tablet by mouth daily.   ondansetron (ZOFRAN) 4 MG tablet Take 1 tablet (4 mg total) by mouth every 8 (eight) hours as needed for nausea or vomiting.   traMADol (ULTRAM) 50 MG tablet Take 50 mg by mouth every 4 (four) hours as needed.   No facility-administered encounter medications on file as of 12/08/2021.    Allergies (verified) Nitrofuran derivatives, Amoxicillin, Hydrochlorothiazide, and Prednisone   History: Past Medical History:  Diagnosis Date   COVID-19 10/19/2020   GERD (gastroesophageal reflux disease)    Hyperlipidemia    Hypertension  Knee joint effusion 10/12/2017   Sleep apnea    Past Surgical History:  Procedure Laterality Date   CARPAL TUNNEL RELEASE Right    CATARACT EXTRACTION     CHOLECYSTECTOMY  2013   PARATHYROIDECTOMY  08/21/2020   removed one gland   TOTAL SHOULDER REPLACEMENT Left 2013   Family History  Problem Relation Age of Onset   Hypertension Mother    Leukemia Mother    Heart failure Father    Breast cancer Daughter 51   Social History   Socioeconomic History   Marital  status: Widowed    Spouse name: Not on file   Number of children: Not on file   Years of education: Not on file   Highest education level: Not on file  Occupational History   Occupation: Retired  Tobacco Use   Smoking status: Former    Types: Cigarettes    Quit date: 07/06/1996    Years since quitting: 25.4   Smokeless tobacco: Never  Vaping Use   Vaping Use: Never used  Substance and Sexual Activity   Alcohol use: Yes    Alcohol/week: 3.0 standard drinks    Types: 3 Glasses of wine per week   Drug use: Never   Sexual activity: Not Currently    Partners: Male  Other Topics Concern   Not on file  Social History Narrative   Pt lives alone   Social Determinants of Health   Financial Resource Strain: Low Risk    Difficulty of Paying Living Expenses: Not hard at all  Food Insecurity: No Food Insecurity   Worried About Charity fundraiser in the Last Year: Never true   [place] in the Last Year: Never true  Transportation Needs: No Transportation Needs   Lack of Transportation (Medical): No   Lack of Transportation (Non-Medical): No  Physical Activity: Inactive   Days of Exercise per Week: 0 days   Minutes of Exercise per Session: 0 min  Stress: No Stress Concern Present   Feeling of Stress : Not at all  Social Connections: Moderately Integrated   Frequency of Communication with Friends and Family: More than three times a week   Frequency of Social Gatherings with Friends and Family: More than three times a week   Attends Religious Services: More than 4 times per year   Active Member of [employer] or Organizations: Yes   Attends Archivist Meetings: More than 4 times per year   Marital Status: Widowed    Tobacco Counseling Counseling given: Not Answered   Clinical Intake:  Pre-visit preparation completed: Yes  Pain : 0-10 Pain Score: 4  Pain Type: Chronic pain Pain Location: Shoulder Pain Orientation: Right Pain Descriptors / Indicators: Aching,  Sore Pain Onset: More than a month ago Pain Frequency: Constant  Pain Location: Knee (right, 7/10)  Nutritional Risks: None Diabetes: No  How often do you need to have someone help you when you read instructions, pamphlets, or other written materials from your doctor or pharmacy?: 1 - Never    Interpreter Needed?: No  Information entered by :: Clemetine Marker LPN   Activities of Daily Living    12/08/2021    1:14 PM 10/31/2021    1:39 PM  In your present state of health, do you have any difficulty performing the following activities:  Hearing? 1 1  Vision? 0 0  Difficulty concentrating or making decisions? 0 0  Walking or climbing stairs? 0 0  Dressing or bathing? 0 0  Doing errands, shopping? 0 0  Preparing Food and eating ? N   Using the Toilet? N   In the past six months, have you accidently leaked urine? N   Do you have problems with loss of bowel control? N   Managing your Medications? N   Managing your Finances? N   Housekeeping or managing your Housekeeping? N     Patient Care Team: Glean Hess, MD as PCP - General (Internal Medicine) Gale Journey Gwen Her, MD as Referring Physician (Endocrinology) Rosine Beat, PA (Pulmonary Disease) Raynelle Jan., MD as Referring Physician (Gastroenterology)  Indicate any recent Medical Services you may have received from other than [facility] providers in the past year (date may be approximate).     Assessment:   This is a routine wellness examination for [place].  Hearing/Vision screen Hearing Screening - Comments::  Pt wears hearing aids  Vision Screening - Comments:: Annual vision screenings [facility] with Dr. Malcolm Metro  Dietary issues and exercise activities discussed: Current Exercise Habits: The patient does not participate in regular exercise at present, Exercise limited by: orthopedic condition(s)   Goals Addressed   None    Depression Screen    12/08/2021    1:12 PM 10/31/2021    1:39 PM  10/02/2021   10:00 AM 07/15/2021    1:36 PM 05/02/2021   10:47 AM 11/05/2020   10:51 AM 10/15/2020   11:05 AM  PHQ 2/9 Scores  PHQ - 2 Score 0 0 0 0 0 0 0  PHQ- 9 Score 2 2 0 2 0 1 0    Fall Risk    12/08/2021    1:14 PM 10/31/2021    1:39 PM 10/02/2021   10:01 AM 07/15/2021    1:36 PM 05/02/2021   10:47 AM  [place] in the past year? 0 0 0 0 0  Number falls in past yr: 0 0 0 1 0  Injury with Fall? 0 0 0 0 0  Risk for fall due to : No Fall Risks No Fall Risks No Fall Risks History of fall(s) No Fall Risks  Follow up Falls prevention discussed Falls evaluation completed Falls evaluation completed Falls evaluation completed Falls evaluation completed    [place]:  Any stairs in or around the home? Yes  If so, are there any without handrails? No  Home free of loose throw rugs in walkways, pet beds, electrical cords, etc? Yes  Adequate lighting in your home to reduce risk of falls? Yes   ASSISTIVE DEVICES UTILIZED TO PREVENT FALLS:  Life alert? No  Use of a cane, walker or w/c? No  Grab bars in the bathroom? No  Shower chair or bench in shower? Yes  Elevated toilet seat or a handicapped toilet? Yes   TIMED UP AND GO:  Was the test performed? No . Telephonic visit  Cognitive Function: Normal cognitive status assessed by direct observation by this Nurse Health Advisor. No abnormalities found.          06/12/2020   10:33 AM 04/26/2019    2:28 PM 04/12/2018   10:30 AM 02/22/2017    2:43 PM 04/06/2016   10:55 AM  6CIT Screen  What Year? 0 points 0 points 0 points 0 points 0 points  What month? 0 points 0 points 0 points 0 points 0 points  What time? 0 points 0 points 0 points 0 points 0 points  Count back from  20 0 points 0 points 0 points 0 points 0 points  Months in reverse 0 points 0 points 0 points 0 points 0 points  Repeat phrase 0 points 0 points 0 points 0 points 0 points  Total Score 0 points 0 points 0 points 0 points 0  points    Immunizations Immunization History  Administered Date(s) Administered   Fluad Quad(high Dose 65+) 04/10/2019, 04/08/2020   Influenza, High Dose Seasonal PF 04/13/2017, 04/12/2018, 04/08/2020   Influenza,inj,Quad PF,6+ Mos 04/06/2016   Influenza-Unspecified 02/18/2015, 05/02/2021   PFIZER Comirnaty(Gray Top)Covid-19 Tri-Sucrose Vaccine 07/18/2019, 08/18/2019, 04/25/2020   PFIZER(Purple Top)SARS-COV-2 Vaccination 07/17/2019, 08/07/2019, 04/25/2020   Pfizer Covid-19 Vaccine Bivalent Booster 5y-11y 04/30/2021   Pneumococcal Conjugate-13 03/20/2014   Pneumococcal Polysaccharide-23 07/07/2005   Tdap 09/26/2015   Zoster Recombinat (Shingrix) 06/23/2018, 09/08/2018   Zoster, Live 07/08/2007    TDAP status: Up to date  Flu Vaccine status: Up to date  Pneumococcal vaccine status: Up to date  Covid-19 vaccine status: Completed vaccines  Qualifies for Shingles Vaccine? Yes   Zostavax completed Yes   Shingrix Completed?: Yes  Screening Tests Health Maintenance  Topic Date Due   INFLUENZA VACCINE  02/03/2022   MAMMOGRAM  09/23/2022   TETANUS/TDAP  09/25/2025   Pneumonia Vaccine 7+ Years old  Completed   DEXA SCAN  Completed   COVID-19 Vaccine  Completed   Zoster Vaccines- Shingrix  Completed   HPV VACCINES  Aged Out   COLONOSCOPY (Pts 45-42yr Insurance coverage will need to be confirmed)  Discontinued    Health Maintenance  There are no preventive care reminders to display for this patient.   Colorectal cancer screening: No longer required.   Mammogram status: Completed 09/22/21. Repeat every year  Bone Density status: Completed 09/04/21. Results reflect: Bone density results: OSTEOPOROSIS. Repeat every 2 years.  Lung Cancer Screening: (Low Dose CT Chest recommended if Age 86-80years, 30 pack-year currently smoking OR have quit w/in 15years.) does not qualify.   Additional Screening:  Hepatitis C Screening: does not qualify  Vision Screening: Recommended  annual ophthalmology exams for early detection of glaucoma and other disorders of the eye. Is the patient up to date with their annual eye exam?  Yes  Who is the provider or what is the name of the office in which the patient attends annual eye exams? Dr. DMalcolm Metro Dental Screening: Recommended annual dental exams for proper oral hygiene  Community Resource Referral / Chronic Care Management: CRR required this visit?  No   CCM required this visit?  No      Plan:     I have personally reviewed and noted the following in the patient's chart:   Medical and social history Use of alcohol, tobacco or illicit drugs  Current medications and supplements including opioid prescriptions.  Functional ability and status Nutritional status Physical activity Advanced directives List of other physicians Hospitalizations, surgeries, and ER visits in previous 12 months Vitals Screenings to include cognitive, depression, and falls Referrals and appointments  In addition, I have reviewed and discussed with patient certain preventive protocols, quality metrics, and best practice recommendations. A written personalized care plan for preventive services as well as general preventive health recommendations were provided to patient.     KClemetine Marker LPN   [id number]  Nurse Notes: none

## 2021-12-23 DIAGNOSIS — M1711 Unilateral primary osteoarthritis, right knee: Secondary | ICD-10-CM | POA: Diagnosis not present

## 2022-01-12 DIAGNOSIS — M1711 Unilateral primary osteoarthritis, right knee: Secondary | ICD-10-CM | POA: Diagnosis not present

## 2022-01-19 DIAGNOSIS — M25511 Pain in right shoulder: Secondary | ICD-10-CM | POA: Diagnosis not present

## 2022-02-04 DIAGNOSIS — L298 Other pruritus: Secondary | ICD-10-CM | POA: Diagnosis not present

## 2022-02-04 DIAGNOSIS — B353 Tinea pedis: Secondary | ICD-10-CM | POA: Diagnosis not present

## 2022-02-04 DIAGNOSIS — L578 Other skin changes due to chronic exposure to nonionizing radiation: Secondary | ICD-10-CM | POA: Diagnosis not present

## 2022-02-04 DIAGNOSIS — Z872 Personal history of diseases of the skin and subcutaneous tissue: Secondary | ICD-10-CM | POA: Diagnosis not present

## 2022-02-10 DIAGNOSIS — M1711 Unilateral primary osteoarthritis, right knee: Secondary | ICD-10-CM | POA: Diagnosis not present

## 2022-02-10 DIAGNOSIS — M25561 Pain in right knee: Secondary | ICD-10-CM | POA: Diagnosis not present

## 2022-02-16 DIAGNOSIS — M1711 Unilateral primary osteoarthritis, right knee: Secondary | ICD-10-CM | POA: Diagnosis not present

## 2022-03-05 DIAGNOSIS — M1711 Unilateral primary osteoarthritis, right knee: Secondary | ICD-10-CM | POA: Diagnosis not present

## 2022-03-23 DIAGNOSIS — G8929 Other chronic pain: Secondary | ICD-10-CM | POA: Diagnosis not present

## 2022-03-23 DIAGNOSIS — M25561 Pain in right knee: Secondary | ICD-10-CM | POA: Diagnosis not present

## 2022-03-30 DIAGNOSIS — M25511 Pain in right shoulder: Secondary | ICD-10-CM | POA: Diagnosis not present

## 2022-04-03 DIAGNOSIS — G8929 Other chronic pain: Secondary | ICD-10-CM | POA: Diagnosis not present

## 2022-04-03 DIAGNOSIS — M25561 Pain in right knee: Secondary | ICD-10-CM | POA: Diagnosis not present

## 2022-04-08 DIAGNOSIS — M25561 Pain in right knee: Secondary | ICD-10-CM | POA: Diagnosis not present

## 2022-04-08 DIAGNOSIS — G8929 Other chronic pain: Secondary | ICD-10-CM | POA: Diagnosis not present

## 2022-04-22 DIAGNOSIS — G8929 Other chronic pain: Secondary | ICD-10-CM | POA: Diagnosis not present

## 2022-04-22 DIAGNOSIS — M25561 Pain in right knee: Secondary | ICD-10-CM | POA: Diagnosis not present

## 2022-05-05 ENCOUNTER — Encounter: Payer: Medicare Other | Admitting: Internal Medicine

## 2022-05-07 ENCOUNTER — Ambulatory Visit (INDEPENDENT_AMBULATORY_CARE_PROVIDER_SITE_OTHER): Payer: Medicare Other | Admitting: Internal Medicine

## 2022-05-07 ENCOUNTER — Encounter: Payer: Self-pay | Admitting: Internal Medicine

## 2022-05-07 VITALS — BP 124/72 | HR 89 | Ht 66.0 in | Wt 170.0 lb

## 2022-05-07 DIAGNOSIS — Z23 Encounter for immunization: Secondary | ICD-10-CM

## 2022-05-07 DIAGNOSIS — R7303 Prediabetes: Secondary | ICD-10-CM

## 2022-05-07 DIAGNOSIS — I10 Essential (primary) hypertension: Secondary | ICD-10-CM

## 2022-05-07 DIAGNOSIS — E782 Mixed hyperlipidemia: Secondary | ICD-10-CM

## 2022-05-07 DIAGNOSIS — D518 Other vitamin B12 deficiency anemias: Secondary | ICD-10-CM

## 2022-05-07 DIAGNOSIS — R300 Dysuria: Secondary | ICD-10-CM | POA: Diagnosis not present

## 2022-05-07 DIAGNOSIS — Z0000 Encounter for general adult medical examination without abnormal findings: Secondary | ICD-10-CM | POA: Diagnosis not present

## 2022-05-07 DIAGNOSIS — M810 Age-related osteoporosis without current pathological fracture: Secondary | ICD-10-CM

## 2022-05-07 DIAGNOSIS — K219 Gastro-esophageal reflux disease without esophagitis: Secondary | ICD-10-CM

## 2022-05-07 LAB — POCT URINALYSIS DIPSTICK
Bilirubin, UA: NEGATIVE
Blood, UA: NEGATIVE
Glucose, UA: NEGATIVE
Ketones, UA: NEGATIVE
Nitrite, UA: POSITIVE
Protein, UA: NEGATIVE
Spec Grav, UA: 1.015 (ref 1.010–1.025)
Urobilinogen, UA: 0.2 E.U./dL
pH, UA: 6 (ref 5.0–8.0)

## 2022-05-07 NOTE — Progress Notes (Signed)
Date:  05/07/2022   Name:  Joanna Sanders   DOB:  Mar 08, 1930   MRN:  [id number]   Chief Complaint: Annual Exam Joanna Sanders is a 88 y.o. female who presents today for her Complete Annual Exam. She feels fairly well. She reports exercising walking. She reports she is sleeping fairly well. Breast complaints none. [place] - [place] luggage and fell onto the carpet on her knees, also hit the bridge of her nose.  Since then her forehead is tender and she has a mild headache off and on.  No vision change, confusion, nausea, weakness.  Mammogram: 09/2021 DEXA: 09/2021 Colonoscopy: 2017 discontinued  Health Maintenance Due  Topic Date Due   COVID-19 Vaccine (8 - Pfizer risk series) 06/25/2021    Immunization History  Administered Date(s) Administered   Fluad Quad(high Dose 65+) 04/10/2019, 04/08/2020, 05/07/2022   Influenza, High Dose Seasonal PF 04/13/2017, 04/12/2018, 04/08/2020   Influenza,inj,Quad PF,6+ Mos 04/06/2016   Influenza-Unspecified 02/18/2015, 05/02/2021   PFIZER Comirnaty(Gray Top)Covid-19 Tri-Sucrose Vaccine 07/18/2019, 08/18/2019, 04/25/2020   PFIZER(Purple Top)SARS-COV-2 Vaccination 07/17/2019, 08/07/2019, 04/25/2020   Pfizer Covid-19 Vaccine Bivalent Booster 5y-11y 04/30/2021   Pneumococcal Conjugate-13 03/20/2014   Pneumococcal Polysaccharide-23 07/07/2005   Tdap 09/26/2015   Zoster Recombinat (Shingrix) 06/23/2018, 09/08/2018   Zoster, Live 07/08/2007    Hypertension This is a chronic problem. The problem is controlled. Associated symptoms include neck pain. Pertinent negatives include no chest pain, headaches, palpitations or shortness of breath. Past treatments include diuretics. The current treatment provides significant improvement. Hypertensive end-organ damage includes kidney disease. There is no history of CAD/MI or CVA.  Gastroesophageal Reflux She reports no abdominal pain, no chest pain, no coughing, no  heartburn or no wheezing. This is a recurrent problem. The problem occurs rarely. Pertinent negatives include no fatigue (but decreased energy). She has tried a PPI for the symptoms. The treatment provided significant relief.  Diabetes She presents for her follow-up diabetic visit. Diabetes type: prediabetes. Pertinent negatives for hypoglycemia include no dizziness, headaches, nervousness/anxiousness or tremors. Pertinent negatives for diabetes include no chest pain and no fatigue (but decreased energy). Pertinent negatives for diabetic complications include no CVA.  Arthritis - in shoulder and knees.  Getting cortisone injections every 3 months from [facility].  DEXA monitored by Endo.  BS being monitored here.  Lab Results  Component Value Date   NA 131 (L) 07/15/2021   K 3.7 07/15/2021   CO2 25 07/15/2021   GLUCOSE 100 (H) 07/15/2021   BUN 15 07/15/2021   CREATININE 0.93 07/15/2021   CALCIUM 9.3 07/15/2021   EGFR 54 (L) 05/02/2021   GFRNONAA 59 (L) 07/15/2021   Lab Results  Component Value Date   CHOL 175 05/02/2021   HDL 47 05/02/2021   LDLCALC 106 (H) 05/02/2021   TRIG 120 05/02/2021   CHOLHDL 3.7 05/02/2021   Lab Results  Component Value Date   TSH 2.180 05/02/2021   Lab Results  Component Value Date   HGBA1C 5.8 (H) 04/13/2017   Lab Results  Component Value Date   WBC 7.0 10/31/2021   HGB 11.4 10/31/2021   HCT 32.7 (L) 10/31/2021   MCV 87 10/31/2021   PLT 276 10/31/2021   Lab Results  Component Value Date   ALT 15 07/15/2021   AST 19 07/15/2021   ALKPHOS 74 07/15/2021   BILITOT 0.6 07/15/2021   Lab Results  Component Value Date   VD25OH 38.3 05/02/2021  Review of Systems  Constitutional:  Negative for chills, fatigue (but decreased energy) and fever.  HENT:  Negative for congestion, hearing loss, trouble swallowing and voice change.   Eyes:  Negative for visual disturbance.  Respiratory:  Negative for cough, chest tightness, shortness of breath  and wheezing.   Cardiovascular:  Negative for chest pain, palpitations and leg swelling.  Gastrointestinal:  Positive for constipation. Negative for abdominal pain, diarrhea, heartburn and vomiting.  Genitourinary:  Negative for dysuria, frequency, genital sores, vaginal bleeding and vaginal discharge.  Musculoskeletal:  Positive for arthralgias, gait problem and neck pain. Negative for joint swelling.  Skin:  Negative for color change and rash.  Neurological:  Negative for dizziness, tremors, light-headedness and headaches.  Hematological:  Negative for adenopathy. Does not bruise/bleed easily.  Psychiatric/Behavioral:  Negative for dysphoric mood and sleep disturbance. The patient is not nervous/anxious.     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 Not 161% certain which antibiotic.     Hydrochlorothiazide     hypercalcemia   Prednisone Other (See Comments)    Gets really hyper    Past Surgical History:  Procedure Laterality Date    CARPAL TUNNEL RELEASE Right    CATARACT EXTRACTION     CHOLECYSTECTOMY  2013   PARATHYROIDECTOMY  08/21/2020   removed one gland   TOTAL SHOULDER REPLACEMENT Left 2013    Social History   Tobacco Use   Smoking status: Former    Types: Cigarettes    Quit date: 07/06/1996    Years since quitting: 25.8   Smokeless tobacco: Never  Vaping Use   Vaping Use: Never used  Substance Use Topics   Alcohol use: Yes    Alcohol/week: 3.0 standard drinks of alcohol    Types: 3 Glasses of wine per week   Drug use: Never     Medication list has been reviewed and updated.  Current Meds  Medication Sig   acetaminophen (TYLENOL) 500 MG tablet Take 1,000 mg by mouth every 6 (six) hours as needed.   Calcium Carbonate-Vit D-Min (CALTRATE 600+D PLUS PO) Take 2 tablets by mouth daily at 2 PM.   colestipol (COLESTID) 1 g tablet Take 1 g by mouth 2 (two) times daily. PRN   esomeprazole (NEXIUM) 40 MG capsule Take 40 mg by mouth daily at 12 noon.   Ferrous Sulfate (IRON PO) Take by mouth daily.   furosemide (LASIX) 20 MG tablet TAKE 1 TABLET(20 MG) BY MOUTH TWICE DAILY (Patient taking differently: as needed. 1-2 daily as needed)   ketoconazole (NIZORAL) 2 % cream Apply 1 application. topically 2 (two) times daily.   meclizine (ANTIVERT) 12.5 MG tablet  Take 1 tablet (12.5 mg total) by mouth 3 (three) times daily as needed for dizziness.   meloxicam (MOBIC) 15 MG tablet Take 1 tablet by mouth daily.   ondansetron (ZOFRAN) 4 MG tablet Take 1 tablet (4 mg total) by mouth every 8 (eight) hours as needed for nausea or vomiting.   traMADol (ULTRAM) 50 MG tablet Take 50 mg by mouth every 4 (four) hours as needed.       05/07/2022   10:23 AM 10/31/2021    1:39 PM 10/02/2021   10:01 AM 07/15/2021    1:36 PM  GAD 7 : Generalized Anxiety Score  Nervous, Anxious, on Edge 0 0 0 0  Control/stop worrying 0 0 0 0  Worry too much - different things 0 0 0 0  Trouble relaxing 0 0 0 0  Restless 0 0 0 0  Easily  annoyed or irritable 0 0 0 0  Afraid - awful might happen 0 0 0 0  Total GAD 7 Score 0 0 0 0  Anxiety Difficulty Not difficult at all   Not difficult at all       05/07/2022   10:23 AM 12/08/2021    1:12 PM 10/31/2021    1:39 PM  Depression screen PHQ 2/9  Decreased Interest 0 0 0  Down, Depressed, Hopeless 1 0 0  PHQ - 2 Score 1 0 0  Altered sleeping 0 1 1  Tired, decreased energy _0 Change in appetite 2 0 0  Feeling bad or failure about yourself  0 0 0  Trouble concentrating 0 0 0  Moving slowly or fidgety/restless 0 0 0  Suicidal thoughts 0 0 0  PHQ-9 Score _1 Difficult doing work/chores Somewhat difficult Not difficult at all Not difficult at all    BP Readings from Last 3 Encounters:  05/07/22 124/72  10/31/21 126/74  10/02/21 (!) 118/54    Physical Exam Vitals and nursing note reviewed.  Constitutional:      General: She is not in acute distress.    Appearance: She is well-developed.  HENT:     Head: Normocephalic and atraumatic. No raccoon eyes, abrasion or contusion.     Jaw: There is normal jaw occlusion.      Comments: Mild tenderness    Right Ear: Tympanic membrane and ear canal normal.     Left Ear: Tympanic membrane and ear canal normal.     Nose:     Right Sinus: No maxillary sinus tenderness.     Left Sinus: No maxillary sinus tenderness.  Eyes:     General: No scleral icterus.       Right eye: No discharge.        Left eye: No discharge.     Extraocular Movements: Extraocular movements intact.     Conjunctiva/sclera: Conjunctivae normal.  Neck:     Thyroid: No thyromegaly.     Vascular: No carotid bruit.  Cardiovascular:     Rate and Rhythm: Normal rate and regular rhythm.     Pulses: Normal pulses.     Heart sounds: Normal heart sounds.  Pulmonary:     Effort: Pulmonary effort is normal. No respiratory distress.     Breath sounds: No wheezing.  Chest:  Breasts:    Right: No mass, nipple discharge, skin change or tenderness.      Left: No mass, nipple discharge, skin change or tenderness.  Abdominal:     General: Bowel sounds are normal.  Palpations: Abdomen is soft.     Tenderness: There is no abdominal tenderness.  Musculoskeletal:     Cervical back: Normal range of motion. No erythema.     Right lower leg: No edema.     Left lower leg: No edema.  Lymphadenopathy:     Cervical: No cervical adenopathy.  Skin:    General: Skin is warm and dry.     Findings: No rash.  Neurological:     Mental Status: She is alert and oriented to person, place, and time.     Cranial Nerves: No cranial nerve deficit.     Sensory: No sensory deficit.     Deep Tendon Reflexes: Reflexes are normal and symmetric.  Psychiatric:        Attention and Perception: Attention normal.        Mood and Affect: Mood normal.     Wt Readings from Last 3 Encounters:  05/07/22 170 lb (77.1 kg)  10/31/21 175 lb (79.4 kg)  10/02/21 177 lb (80.3 kg)    BP 124/72 (BP Location: Right Arm, Patient Position: Sitting, Cuff Size: Normal)   Pulse 89   Ht 5' 6" (1.676 m)   Wt 170 lb (77.1 kg)   SpO2 98%   BMI 27.44 kg/m   Assessment and Plan: 1. Annual physical exam Normal exam Up to date on screenings and immunizations. Recent fall with minimal injury.  May have caused some flare of OA of the neck - continue tylenol and heat/ice. Off and on UTI like symptoms - UA positive so will send for culture before treating  2. Benign essential HTN Clinically stable exam with well controlled BP. Tolerating medications without side effects at this time. Pt to continue current regimen and low sodium diet; benefits of regular exercise as able discussed. - CBC with Differential/Platelet - Comprehensive metabolic panel - TSH - POCT urinalysis dipstick  3. Gastroesophageal reflux disease without esophagitis Symptoms well controlled on daily PPI No red flag signs such as weight loss, n/v, melena - CBC with Differential/Platelet  4. Age-related  osteoporosis without current pathological fracture Followed by [facility]  5. Other vitamin B12 deficiency anemia Taking only a multivitamin with B12; not separate oral or injections Recommend a lab check and will advise - Vitamin B12  6. Hyperlipidemia, mixed Continue low fat diet choices - Lipid panel  7. Prediabetes Has lost 5 lbs since last visit Appetite is decreased - recommend Premier Protein or Ensure supplement - Hemoglobin A1c  8. Need for immunization against influenza - Flu Vaccine QUAD High Dose(Fluad)   Partially dictated using Editor, commissioning. Any errors are unintentional.  Halina Maidens, MD [facility]  05/07/2022

## 2022-05-08 NOTE — Addendum Note (Signed)
Addended by: Glean Hess on: 05/08/2022 12:41 PM   Modules accepted: Level of Service

## 2022-05-11 ENCOUNTER — Telehealth: Payer: Self-pay | Admitting: Internal Medicine

## 2022-05-11 NOTE — Telephone Encounter (Signed)
Copied from [place] [id number]. Topic: General - Inquiry >> May 11, 2022 10:44 AM Penni Bombard wrote: Reason for CRM: Pt called about her lab results from Thursday.

## 2022-05-11 NOTE — Telephone Encounter (Signed)
Noted  KP 

## 2022-05-12 ENCOUNTER — Other Ambulatory Visit: Payer: Self-pay

## 2022-05-12 ENCOUNTER — Other Ambulatory Visit: Payer: Self-pay | Admitting: Internal Medicine

## 2022-05-12 DIAGNOSIS — N3000 Acute cystitis without hematuria: Secondary | ICD-10-CM

## 2022-05-12 LAB — URINE CULTURE

## 2022-05-12 MED ORDER — CEFUROXIME AXETIL 250 MG PO TABS
250.0000 mg | ORAL_TABLET | Freq: Two times a day (BID) | ORAL | 0 refills | Status: AC
Start: 2022-05-12 — End: 2022-05-22

## 2022-05-14 LAB — CBC WITH DIFFERENTIAL/PLATELET
Basophils Absolute: 0.1 10*3/uL (ref 0.0–0.2)
Basos: 1 %
EOS (ABSOLUTE): 0.1 10*3/uL (ref 0.0–0.4)
Eos: 2 %
Hematocrit: 35.9 % (ref 34.0–46.6)
Hemoglobin: 12.4 g/dL (ref 11.1–15.9)
Immature Grans (Abs): 0.1 10*3/uL (ref 0.0–0.1)
Immature Granulocytes: 1 %
Lymphocytes Absolute: 2.3 10*3/uL (ref 0.7–3.1)
Lymphs: 35 %
MCH: 31.9 pg (ref 26.6–33.0)
MCHC: 34.5 g/dL (ref 31.5–35.7)
MCV: 92 fL (ref 79–97)
Monocytes Absolute: 0.7 10*3/uL (ref 0.1–0.9)
Monocytes: 11 %
Neutrophils Absolute: 3.3 10*3/uL (ref 1.4–7.0)
Neutrophils: 50 %
Platelets: 290 10*3/uL (ref 150–450)
RBC: 3.89 x10E6/uL (ref 3.77–5.28)
RDW: 12.9 % (ref 11.7–15.4)
WBC: 6.5 10*3/uL (ref 3.4–10.8)

## 2022-05-14 LAB — TSH: TSH: 1.51 u[IU]/mL (ref 0.450–4.500)

## 2022-05-14 LAB — LIPID PANEL
Chol/HDL Ratio: 3.7 ratio (ref 0.0–4.4)
Cholesterol, Total: 209 mg/dL — ABNORMAL HIGH (ref 100–199)
HDL: 56 mg/dL (ref >39)
LDL Chol Calc (NIH): 131 mg/dL — ABNORMAL HIGH (ref 0–99)
Triglycerides: 126 mg/dL (ref 0–149)
VLDL Cholesterol Cal: 22 mg/dL (ref 5–40)

## 2022-05-14 LAB — HEMOGLOBIN A1C
Est. average glucose Bld gHb Est-mCnc: 120 mg/dL
Hgb A1c MFr Bld: 5.8 % — ABNORMAL HIGH (ref 4.8–5.6)

## 2022-05-14 LAB — COMPREHENSIVE METABOLIC PANEL
ALT: 14 IU/L (ref 0–32)
AST: 19 IU/L (ref 0–40)
Albumin/Globulin Ratio: 2.1 (ref 1.2–2.2)
Albumin: 4.7 g/dL — ABNORMAL HIGH (ref 3.6–4.6)
Alkaline Phosphatase: 92 IU/L (ref 44–121)
BUN/Creatinine Ratio: 13 (ref 12–28)
BUN: 13 mg/dL (ref 10–36)
Bilirubin Total: 0.5 mg/dL (ref 0.0–1.2)
CO2: 22 mmol/L (ref 20–29)
Calcium: 10 mg/dL (ref 8.7–10.3)
Chloride: 100 mmol/L (ref 96–106)
Creatinine, Ser: 1.02 mg/dL — ABNORMAL HIGH (ref 0.57–1.00)
Globulin, Total: 2.2 g/dL (ref 1.5–4.5)
Glucose: 110 mg/dL — ABNORMAL HIGH (ref 70–99)
Potassium: 4.3 mmol/L (ref 3.5–5.2)
Sodium: 137 mmol/L (ref 134–144)
Total Protein: 6.9 g/dL (ref 6.0–8.5)
eGFR: 52 mL/min/{1.73_m2} — ABNORMAL LOW (ref >59)

## 2022-05-14 LAB — VITAMIN B12: Vitamin B-12: 455 pg/mL (ref 232–1245)

## 2022-06-18 DIAGNOSIS — M19011 Primary osteoarthritis, right shoulder: Secondary | ICD-10-CM | POA: Diagnosis not present

## 2022-07-08 ENCOUNTER — Encounter: Payer: Self-pay | Admitting: Internal Medicine

## 2022-07-08 ENCOUNTER — Ambulatory Visit (INDEPENDENT_AMBULATORY_CARE_PROVIDER_SITE_OTHER): Payer: BLUE CROSS/BLUE SHIELD | Admitting: Internal Medicine

## 2022-07-08 VITALS — BP 129/72 | HR 94 | Temp 98.2°F | Ht 66.0 in | Wt 165.2 lb

## 2022-07-08 DIAGNOSIS — R6889 Other general symptoms and signs: Secondary | ICD-10-CM

## 2022-07-08 DIAGNOSIS — R0602 Shortness of breath: Secondary | ICD-10-CM

## 2022-07-08 DIAGNOSIS — R509 Fever, unspecified: Secondary | ICD-10-CM | POA: Diagnosis not present

## 2022-07-08 DIAGNOSIS — R051 Acute cough: Secondary | ICD-10-CM

## 2022-07-08 LAB — POC COVID19 BINAXNOW: SARS Coronavirus 2 Ag: NEGATIVE

## 2022-07-08 MED ORDER — GUAIFENESIN-CODEINE 100-10 MG/5ML PO SYRP: 5.0000 mL | ORAL_SOLUTION | Freq: Three times a day (TID) | ORAL | 0 refills | Status: DC | PRN

## 2022-07-08 MED ORDER — AZITHROMYCIN 250 MG PO TABS: ORAL_TABLET | ORAL | 0 refills | Status: AC

## 2022-07-08 NOTE — Progress Notes (Signed)
Date:  07/08/2022   Name:  Joanna Sanders   DOB:  10-04-1929   MRN:  [id number]   Chief Complaint: Sinusitis  Sinusitis This is a new problem. Episode onset: 4 days. The maximum temperature recorded prior to her arrival was 100.4 - 100.9 F. Associated symptoms include chills, congestion, coughing, headaches, shortness of breath and a sore throat.    Lab Results  Component Value Date   NA 137 05/07/2022   K 4.3 05/07/2022   CO2 22 05/07/2022   GLUCOSE 110 (H) 05/07/2022   BUN 13 05/07/2022   CREATININE 1.02 (H) 05/07/2022   CALCIUM 10.0 05/07/2022   EGFR 52 (L) 05/07/2022   GFRNONAA 59 (L) 07/15/2021   Lab Results  Component Value Date   CHOL 209 (H) 05/07/2022   HDL 56 05/07/2022   LDLCALC 131 (H) 05/07/2022   TRIG 126 05/07/2022   CHOLHDL 3.7 05/07/2022   Lab Results  Component Value Date   TSH 1.510 05/07/2022   Lab Results  Component Value Date   HGBA1C 5.8 (H) 05/07/2022   Lab Results  Component Value Date   WBC 6.5 05/07/2022   HGB 12.4 05/07/2022   HCT 35.9 05/07/2022   MCV 92 05/07/2022   PLT 290 05/07/2022   Lab Results  Component Value Date   ALT 14 05/07/2022   AST 19 05/07/2022   ALKPHOS 92 05/07/2022   BILITOT 0.5 05/07/2022   Lab Results  Component Value Date   VD25OH 38.3 05/02/2021     Review of Systems  Constitutional:  Positive for chills, fatigue and fever.  HENT:  Positive for congestion and sore throat.   Respiratory:  Positive for cough and shortness of breath. Negative for wheezing.   Cardiovascular:  Negative for chest pain, palpitations and leg swelling.  Gastrointestinal:  Negative for abdominal pain, constipation, diarrhea and vomiting.  Neurological:  Positive for headaches. Negative for dizziness.    Patient Active Problem List   Diagnosis Date Noted   Aortic atherosclerosis ([place]) 10/15/2020   S/P parathyroidectomy ([place]) 10/15/2020   Anemia, vitamin B12 deficiency 06/24/2020   IPMN (intraductal papillary  mucinous neoplasm) 06/06/2018   Postcholecystectomy diarrhea 06/06/2018   Plantar fascial fibromatosis 10/12/2017   Osteoarthritis of knee 10/12/2017   Thigh numbness 10/12/2017   OSA (obstructive sleep apnea) 09/18/2016   Localized primary osteoarthritis of left shoulder region 06/18/2016   Abdominal pain, chronic, right upper quadrant 04/29/2016   Extrahepatic biloma 04/15/2016   Age-related osteoporosis without current pathological fracture 04/06/2016   Allergic rhinitis 01/24/2015   Benign essential HTN 01/24/2015   Hyperlipidemia, mixed 01/24/2015   Gastroesophageal reflux disease 01/24/2015   Generalized OA 01/24/2015   Avitaminosis D 01/24/2015    Allergies  Allergen Reactions   Nitrofuran Derivatives Diarrhea   Amoxicillin Itching    Believes that was amoxicillin. Not 737% certain which antibiotic.     Hydrochlorothiazide     hypercalcemia   Prednisone Other (See Comments)    Gets really hyper    Past Surgical History:  Procedure Laterality Date   CARPAL TUNNEL RELEASE Right    CATARACT EXTRACTION     CHOLECYSTECTOMY  2013   PARATHYROIDECTOMY  08/21/2020   removed one gland   TOTAL SHOULDER REPLACEMENT Left 2013    Social History   Tobacco Use   Smoking status: Former    Types: Cigarettes    Quit date: 07/06/1996    Years since quitting: 26.0   Smokeless tobacco: Never  Vaping Use  Vaping Use: Never used  Substance Use Topics   Alcohol use: Yes    Alcohol/week: 3.0 standard drinks of alcohol    Types: 3 Glasses of wine per week   Drug use: Never     Medication list has been reviewed and updated.  Current Meds  Medication Sig   acetaminophen (TYLENOL) 500 MG tablet Take 1,000 mg by mouth every 6 (six) hours as needed.   azithromycin (ZITHROMAX Z-PAK) 250 MG tablet UAD   Calcium Carbonate-Vit D-Min (CALTRATE 600+D PLUS PO) Take 2 tablets by mouth daily at 2 PM.   colestipol (COLESTID) 1 g tablet Take 1 g by mouth 2 (two) times daily. PRN    esomeprazole (NEXIUM) 40 MG capsule Take 40 mg by mouth daily at 12 noon.   Ferrous Sulfate (IRON PO) Take by mouth daily.   furosemide (LASIX) 20 MG tablet TAKE 1 TABLET(20 MG) BY MOUTH TWICE DAILY (Patient taking differently: as needed. 1-2 daily as needed)   guaiFENesin-codeine (ROBITUSSIN AC) 100-10 MG/5ML syrup Take 5 mLs by mouth 3 (three) times daily as needed for cough.   ketoconazole (NIZORAL) 2 % cream Apply 1 application. topically 2 (two) times daily.   meclizine (ANTIVERT) 12.5 MG tablet Take 1 tablet (12.5 mg total) by mouth 3 (three) times daily as needed for dizziness.   meloxicam (MOBIC) 15 MG tablet Take 1 tablet by mouth daily.   ondansetron (ZOFRAN) 4 MG tablet Take 1 tablet (4 mg total) by mouth every 8 (eight) hours as needed for nausea or vomiting.   traMADol (ULTRAM) 50 MG tablet Take 50 mg by mouth every 4 (four) hours as needed.       07/08/2022    9:51 AM 05/07/2022   10:23 AM 10/31/2021    1:39 PM 10/02/2021   10:01 AM  GAD 7 : Generalized Anxiety Score  Nervous, Anxious, on Edge 0 0 0 0  Control/stop worrying 0 0 0 0  Worry too much - different things 0 0 0 0  Trouble relaxing 0 0 0 0  Restless 0 0 0 0  Easily annoyed or irritable 0 0 0 0  Afraid - awful might happen 0 0 0 0  Total GAD 7 Score 0 0 0 0  Anxiety Difficulty Not difficult at all Not difficult at all         07/08/2022    9:51 AM 05/07/2022   10:23 AM 12/08/2021    1:12 PM  Depression screen PHQ 2/9  Decreased Interest 0 0 0  Down, Depressed, Hopeless 0 1 0  PHQ - 2 Score 0 1 0  Altered sleeping 0 0 1  Tired, decreased energy 0 2 1  Change in appetite 0 2 0  Feeling bad or failure about yourself  0 0 0  Trouble concentrating 0 0 0  Moving slowly or fidgety/restless 0 0 0  Suicidal thoughts 0 0 0  PHQ-9 Score 0 5 2  Difficult doing work/chores Not difficult at all Somewhat difficult Not difficult at all    BP Readings from Last 3 Encounters:  07/08/22 129/72  05/07/22 124/72  10/31/21  126/74    Physical Exam Constitutional:      Appearance: She is ill-appearing.  HENT:     Right Ear: Tympanic membrane normal.     Left Ear: Tympanic membrane normal.     Nose:     Right Sinus: Maxillary sinus tenderness present.     Left Sinus: Maxillary sinus tenderness present.  Mouth/Throat:     Pharynx: No posterior oropharyngeal erythema.     Tonsils: No tonsillar exudate.  Cardiovascular:     Rate and Rhythm: Normal rate and regular rhythm.     Pulses: Normal pulses.  Pulmonary:     Breath sounds: Transmitted upper airway sounds present. No wheezing or rhonchi.  Musculoskeletal:     Cervical back: Normal range of motion.  Lymphadenopathy:     Cervical: No cervical adenopathy.  Neurological:     Mental Status: She is alert.     Wt Readings from Last 3 Encounters:  07/08/22 165 lb 3.2 oz (74.9 kg)  05/07/22 170 lb (77.1 kg)  10/31/21 175 lb (79.4 kg)    BP 129/72   Pulse 94   Temp 98.2 F (36.8 C) (Oral)   Ht 5' 6" (1.676 m)   Wt 165 lb 3.2 oz (74.9 kg)   SpO2 98%   BMI 26.66 kg/m   Assessment and Plan: Problem List Items Addressed This Visit   None Visit Diagnoses     Flu-like symptoms    -  Primary   Relevant Orders   POC COVID-19 BinaxNow (Completed)   Acute cough       with negative Covid likely bacterial bronchitis/sinusitis Push fluids, take tylenol for headache   Relevant Medications   guaiFENesin-codeine (ROBITUSSIN AC) 100-10 MG/5ML syrup   azithromycin (ZITHROMAX Z-PAK) 250 MG tablet        Partially dictated using Editor, commissioning. Any errors are unintentional.  Halina Maidens, MD [facility]  07/08/2022

## 2022-08-27 DIAGNOSIS — M19011 Primary osteoarthritis, right shoulder: Secondary | ICD-10-CM | POA: Diagnosis not present

## 2022-09-05 ENCOUNTER — Other Ambulatory Visit: Payer: Self-pay | Admitting: Internal Medicine

## 2022-09-05 DIAGNOSIS — I10 Essential (primary) hypertension: Secondary | ICD-10-CM

## 2022-09-07 NOTE — Telephone Encounter (Signed)
Requested Prescriptions  Pending Prescriptions Disp Refills   furosemide (LASIX) 20 MG tablet [Pharmacy Med Name: FUROSEMIDE '20MG'$  TABLETS] 180 tablet 0    Sig: Take 1 tablet (20 mg total) by mouth as needed. 1-2 daily as needed     Cardiovascular:  Diuretics - Loop Failed - 09/05/2022  6:31 AM      Failed - Cr in normal range and within 180 days    Creatinine, Ser  Date Value Ref Range Status  05/07/2022 1.02 (H) 0.57 - 1.00 mg/dL Final         Failed - Mg Level in normal range and within 180 days    No results found for: "MG"       Passed - K in normal range and within 180 days    Potassium  Date Value Ref Range Status  05/07/2022 4.3 3.5 - 5.2 mmol/L Final         Passed - Ca in normal range and within 180 days    Calcium  Date Value Ref Range Status  05/07/2022 10.0 8.7 - 10.3 mg/dL Final         Passed - Na in normal range and within 180 days    Sodium  Date Value Ref Range Status  05/07/2022 137 134 - 144 mmol/L Final         Passed - Cl in normal range and within 180 days    Chloride  Date Value Ref Range Status  05/07/2022 100 96 - 106 mmol/L Final         Passed - Last BP in normal range    BP Readings from Last 1 Encounters:  07/08/22 129/72         Passed - Valid encounter within last 6 months    Recent Outpatient Visits           2 months ago Flu-like symptoms   [place] at [facility], Jesse Sans, MD   4 months ago Annual physical exam   [place] at [facility], Jesse Sans, MD   10 months ago Benign essential HTN   [place] at [facility], Jesse Sans, MD   11 months ago Acute breast pain   [place] at [facility], Jesse Sans, MD   1 year ago Chest tightness   [place] at [facility], Jesse Sans, MD       Future Appointments              In 8 months [facility] Melia, Jesse Sans, MD [place] at [facility], [facility]

## 2022-09-10 DIAGNOSIS — M810 Age-related osteoporosis without current pathological fracture: Secondary | ICD-10-CM | POA: Diagnosis not present

## 2022-09-10 DIAGNOSIS — E892 Postprocedural hypoparathyroidism: Secondary | ICD-10-CM | POA: Diagnosis not present

## 2022-09-10 LAB — HM DEXA SCAN

## 2022-09-10 LAB — VITAMIN D 25 HYDROXY (VIT D DEFICIENCY, FRACTURES): Vit D, 25-Hydroxy: 47

## 2022-11-09 ENCOUNTER — Ambulatory Visit: Payer: Self-pay | Admitting: *Deleted

## 2022-11-09 ENCOUNTER — Ambulatory Visit (INDEPENDENT_AMBULATORY_CARE_PROVIDER_SITE_OTHER): Payer: BLUE CROSS/BLUE SHIELD | Admitting: Internal Medicine

## 2022-11-09 ENCOUNTER — Encounter: Payer: Self-pay | Admitting: Internal Medicine

## 2022-11-09 VITALS — BP 128/74 | HR 73 | Temp 97.6°F | Ht 66.0 in | Wt 167.0 lb

## 2022-11-09 DIAGNOSIS — R1013 Epigastric pain: Secondary | ICD-10-CM

## 2022-11-09 DIAGNOSIS — G8929 Other chronic pain: Secondary | ICD-10-CM

## 2022-11-09 DIAGNOSIS — Z1231 Encounter for screening mammogram for malignant neoplasm of breast: Secondary | ICD-10-CM

## 2022-11-09 NOTE — Progress Notes (Signed)
Date:  11/09/2022   Name:  Joanna Sanders   DOB:  09/22/1929   MRN:  [id number]   Chief Complaint: Abdominal Pain (X 2 week w/ nausea. Loss of appetite. Still has appendix but no gall bladder. Running low grade fever for 2 weeks.)  Abdominal Pain This is a new problem. The current episode started 1 to 4 weeks ago. The onset quality is gradual. The problem occurs daily. The problem has been unchanged. The pain is located in the epigastric region. The pain is mild. The quality of the pain is cramping and a sensation of fullness. The abdominal pain does not radiate. Associated symptoms include constipation, a fever (very low grade at times) and nausea. Pertinent negatives include no belching, diarrhea, hematochezia, vomiting or weight loss. Nothing aggravates the pain. The pain is relieved by Nothing. She has tried proton pump inhibitors (on daily nexium) for the symptoms.    Lab Results  Component Value Date   NA 137 05/07/2022   K 4.3 05/07/2022   CO2 22 05/07/2022   GLUCOSE 110 (H) 05/07/2022   BUN 13 05/07/2022   CREATININE 1.02 (H) 05/07/2022   CALCIUM 10.0 05/07/2022   EGFR 52 (L) 05/07/2022   GFRNONAA 59 (L) 07/15/2021   Lab Results  Component Value Date   CHOL 209 (H) 05/07/2022   HDL 56 05/07/2022   LDLCALC 131 (H) 05/07/2022   TRIG 126 05/07/2022   CHOLHDL 3.7 05/07/2022   Lab Results  Component Value Date   TSH 1.510 05/07/2022   Lab Results  Component Value Date   HGBA1C 5.8 (H) 05/07/2022   Lab Results  Component Value Date   WBC 6.5 05/07/2022   HGB 12.4 05/07/2022   HCT 35.9 05/07/2022   MCV 92 05/07/2022   PLT 290 05/07/2022   Lab Results  Component Value Date   ALT 14 05/07/2022   AST 19 05/07/2022   ALKPHOS 92 05/07/2022   BILITOT 0.5 05/07/2022   Lab Results  Component Value Date   VD25OH 38.3 05/02/2021     Review of Systems  Constitutional:  Positive for appetite change, fatigue and fever (very low grade at times). Negative for  chills, diaphoresis and weight loss.  Respiratory:  Negative for chest tightness and shortness of breath.   Cardiovascular:  Negative for chest pain.  Gastrointestinal:  Positive for abdominal pain, constipation and nausea. Negative for blood in stool, diarrhea, hematochezia and vomiting.  Psychiatric/Behavioral:  Negative for dysphoric mood and sleep disturbance. The patient is not nervous/anxious.     Patient Active Problem List   Diagnosis Date Noted   Aortic atherosclerosis (HCC) 10/15/2020   S/P parathyroidectomy 10/15/2020   Anemia, vitamin B12 deficiency 06/24/2020   IPMN (intraductal papillary mucinous neoplasm) 06/06/2018   Postcholecystectomy diarrhea 06/06/2018   Plantar fascial fibromatosis 10/12/2017   Osteoarthritis of knee 10/12/2017   Thigh numbness 10/12/2017   OSA (obstructive sleep apnea) 09/18/2016   Localized primary osteoarthritis of left shoulder region 06/18/2016   Abdominal pain, chronic, right upper quadrant 04/29/2016   Extrahepatic biloma 04/15/2016   Age-related osteoporosis without current pathological fracture 04/06/2016   Allergic rhinitis 01/24/2015   Benign essential HTN 01/24/2015   Hyperlipidemia, mixed 01/24/2015   Gastroesophageal reflux disease 01/24/2015   Generalized OA 01/24/2015   Avitaminosis D 01/24/2015    Allergies  Allergen Reactions   Nitrofuran Derivatives Diarrhea   Amoxicillin Itching    Believes that was amoxicillin. Not 100% certain which antibiotic.     Hydrochlorothiazide  hypercalcemia   Prednisone Other (See Comments)    Gets really hyper    Past Surgical History:  Procedure Laterality Date   CARPAL TUNNEL RELEASE Right    CATARACT EXTRACTION     CHOLECYSTECTOMY  2013   PARATHYROIDECTOMY  08/21/2020   removed one gland   TOTAL SHOULDER REPLACEMENT Left 2013    Social History   Tobacco Use   Smoking status: Former    Types: Cigarettes    Quit date: 07/06/1996    Years since quitting: 26.3   Smokeless  tobacco: Never  Vaping Use   Vaping Use: Never used  Substance Use Topics   Alcohol use: Yes    Alcohol/week: 3.0 standard drinks of alcohol    Types: 3 Glasses of wine per week   Drug use: Never     Medication list has been reviewed and updated.  Current Meds  Medication Sig   acetaminophen (TYLENOL) 500 MG tablet Take 1,000 mg by mouth every 6 (six) hours as needed.   Calcium 200 MG TABS Take by mouth.   Calcium Carbonate-Vit D-Min (CALTRATE 600+D PLUS PO) Take 2 tablets by mouth daily at 2 PM.   colestipol (COLESTID) 1 g tablet Take 1 g by mouth 2 (two) times daily. PRN   esomeprazole (NEXIUM) 40 MG capsule Take 40 mg by mouth daily at 12 noon.   Ferrous Sulfate (IRON PO) Take by mouth daily.   furosemide (LASIX) 20 MG tablet Take 1 tablet (20 mg total) by mouth as needed. 1-2 daily as needed   ketoconazole (NIZORAL) 2 % cream Apply 1 application. topically 2 (two) times daily.   Multiple Vitamin (MULTIVITAMIN) capsule Take 1 capsule by mouth daily.   Omega-3 Fatty Acids (FISH OIL) 500 MG CAPS Take by mouth.   traMADol (ULTRAM) 50 MG tablet Take 50 mg by mouth every 4 (four) hours as needed.       11/09/2022    4:58 PM 07/08/2022    9:51 AM 05/07/2022   10:23 AM 10/31/2021    1:39 PM  GAD 7 : Generalized Anxiety Score  Nervous, Anxious, on Edge 0 0 0 0  Control/stop worrying 0 0 0 0  Worry too much - different things 0 0 0 0  Trouble relaxing 0 0 0 0  Restless 0 0 0 0  Easily annoyed or irritable 0 0 0 0  Afraid - awful might happen 0 0 0 0  Total GAD 7 Score 0 0 0 0  Anxiety Difficulty Not difficult at all Not difficult at all Not difficult at all        11/09/2022    4:58 PM 07/08/2022    9:51 AM 05/07/2022   10:23 AM  Depression screen PHQ 2/9  Decreased Interest 0 0 0  Down, Depressed, Hopeless 0 0 1  PHQ - 2 Score 0 0 1  Altered sleeping 0 0 0  Tired, decreased energy 0 0 2  Change in appetite 0 0 2  Feeling bad or failure about yourself  0 0 0  Trouble  concentrating 0 0 0  Moving slowly or fidgety/restless 0 0 0  Suicidal thoughts 0 0 0  PHQ-9 Score 0 0 5  Difficult doing work/chores Not difficult at all Not difficult at all Somewhat difficult    BP Readings from Last 3 Encounters:  11/09/22 128/74  07/08/22 129/72  05/07/22 124/72    Physical Exam Vitals and nursing note reviewed.  Constitutional:      General: She  is not in acute distress.    Appearance: She is well-developed. She is not ill-appearing.  HENT:     Head: Normocephalic and atraumatic.  Pulmonary:     Effort: Pulmonary effort is normal. No respiratory distress.  Abdominal:     General: Abdomen is flat. Bowel sounds are decreased. There is no distension.     Palpations: There is no shifting dullness, hepatomegaly or splenomegaly.     Tenderness: There is abdominal tenderness in the epigastric area. There is no guarding or rebound.  Skin:    General: Skin is warm and dry.     Findings: No rash.  Neurological:     Mental Status: She is alert and oriented to person, place, and time.  Psychiatric:        Mood and Affect: Mood normal.        Behavior: Behavior normal.     Wt Readings from Last 3 Encounters:  11/09/22 167 lb (75.8 kg)  07/08/22 165 lb 3.2 oz (74.9 kg)  05/07/22 170 lb (77.1 kg)    BP 128/74   Pulse 73   Temp 97.6 F (36.4 C) (Oral)   Ht 5\' 6"  (1.676 m)   Wt 167 lb (75.8 kg)   SpO2 100%   BMI 26.95 kg/m   Assessment and Plan:  Problem List Items Addressed This Visit   None Visit Diagnoses     Chronic epigastric pain    -  Primary   for the past 2 weeks will get screening labs, H Pylori increase nexium to bid   Relevant Orders   CBC with Differential/Platelet   Amylase   Lipase   H. pylori breath test   Basic metabolic panel   Encounter for screening mammogram for breast cancer       Relevant Orders   MM 3D SCREENING MAMMOGRAM BILATERAL BREAST       No follow-ups on file.   Partially dictated using Dragon software,  any errors are not intentional.  Reubin Milan, MD [facility] [place], [place]

## 2022-11-09 NOTE — Patient Instructions (Signed)
Call [facility] to schedule your mammogram at [phone].  

## 2022-11-09 NOTE — Telephone Encounter (Signed)
  Chief Complaint: Upper abdominal pain Symptoms: 4-5/10 pain , constant  "Right below sternum" x 2 weeks. LGT 99-100. Nausea Frequency: 2 weeks Pertinent Negatives: Patient denies does not radiate, no vomiting Disposition: [] ED /[] Urgent Care (no appt availability in office) / [x] Appointment(In office/virtual)/ []  [facility]/ [] Home Care/ [] Refused Recommended Disposition /[] [facility]/ []  Follow-up with PCP Additional Notes: Appt secured for today. Care advise provided,  pt verbalizes understanding.  Reason for Disposition  [1] MILD-MODERATE pain AND [2] not relieved by antacid medicine  Answer Assessment - Initial Assessment Questions 1. LOCATION: "Where does it hurt?"      Upper 2. RADIATION: "Does the pain shoot anywhere else?" (e.g., chest, back)     No 3. ONSET: "When did the pain begin?" (e.g., minutes, hours or days ago)      2 weeks ago      5. PATTERN "Does the pain come and go, or is it constant?"    - If it comes and goes: "How long does it last?" "Do you have pain now?"     (Note: Comes and goes means the pain is intermittent. It goes away completely between bouts.)    - If constant: "Is it getting better, staying the same, or getting worse?"      (Note: Constant means the pain never goes away completely; most serious pain is constant and gets worse.)      Constant 6. SEVERITY: "How bad is the pain?"  (e.g., Scale 1-10; mild, moderate, or severe)    - MILD (1-3): Doesn't interfere with normal activities, abdomen soft and not tender to touch..     - MODERATE (4-7): Interferes with normal activities or awakens from sleep, abdomen tender to touch.     - SEVERE (8-10): Excruciating pain, doubled over, unable to do any normal activities.       4-5/10 7. RECURRENT SYMPTOM: "Have you ever had this type of stomach pain before?" If Yes, ask: "When was the last time?" and "What happened that time?"       8. AGGRAVATING FACTORS: "Does anything seem to cause  this pain?" (e.g., foods, stress, alcohol)      9. CARDIAC SYMPTOMS: "Do you have any of the following symptoms: chest pain, difficulty breathing, sweating, nausea?"      10. OTHER SYMPTOMS: "Do you have any other symptoms?" (e.g., back pain, diarrhea, fever, urination pain, vomiting)       Nausea, no appetite, LGT 99-100.  Protocols used: Abdominal Pain - Upper-A-AH

## 2022-11-11 LAB — CBC WITH DIFFERENTIAL/PLATELET
Basophils Absolute: 0.1 10*3/uL (ref 0.0–0.2)
Basos: 1 %
EOS (ABSOLUTE): 0.1 10*3/uL (ref 0.0–0.4)
Eos: 1 %
Hematocrit: 35 % (ref 34.0–46.6)
Hemoglobin: 12.1 g/dL (ref 11.1–15.9)
Immature Grans (Abs): 0 10*3/uL (ref 0.0–0.1)
Immature Granulocytes: 0 %
Lymphocytes Absolute: 2.5 10*3/uL (ref 0.7–3.1)
Lymphs: 34 %
MCH: 31.3 pg (ref 26.6–33.0)
MCHC: 34.6 g/dL (ref 31.5–35.7)
MCV: 91 fL (ref 79–97)
Monocytes Absolute: 0.8 10*3/uL (ref 0.1–0.9)
Monocytes: 11 %
Neutrophils Absolute: 3.9 10*3/uL (ref 1.4–7.0)
Neutrophils: 53 %
Platelets: 313 10*3/uL (ref 150–450)
RBC: 3.86 x10E6/uL (ref 3.77–5.28)
RDW: 12.9 % (ref 11.7–15.4)
WBC: 7.5 10*3/uL (ref 3.4–10.8)

## 2022-11-11 LAB — BASIC METABOLIC PANEL
BUN/Creatinine Ratio: 19 (ref 12–28)
BUN: 19 mg/dL (ref 10–36)
CO2: 26 mmol/L (ref 20–29)
Calcium: 10.3 mg/dL (ref 8.7–10.3)
Chloride: 102 mmol/L (ref 96–106)
Creatinine, Ser: 1.02 mg/dL — ABNORMAL HIGH (ref 0.57–1.00)
Glucose: 103 mg/dL — ABNORMAL HIGH (ref 70–99)
Potassium: 4.7 mmol/L (ref 3.5–5.2)
Sodium: 140 mmol/L (ref 134–144)
eGFR: 52 mL/min/{1.73_m2} — ABNORMAL LOW (ref >59)

## 2022-11-11 LAB — LIPASE: Lipase: 41 U/L (ref 14–85)

## 2022-11-11 LAB — AMYLASE: Amylase: 63 U/L (ref 31–110)

## 2022-11-11 LAB — H. PYLORI BREATH TEST: H pylori Breath Test: NEGATIVE

## 2022-11-20 DIAGNOSIS — H04123 Dry eye syndrome of bilateral lacrimal glands: Secondary | ICD-10-CM | POA: Diagnosis not present

## 2022-11-20 DIAGNOSIS — H5203 Hypermetropia, bilateral: Secondary | ICD-10-CM | POA: Diagnosis not present

## 2022-11-20 DIAGNOSIS — H52223 Regular astigmatism, bilateral: Secondary | ICD-10-CM | POA: Diagnosis not present

## 2022-11-20 DIAGNOSIS — H524 Presbyopia: Secondary | ICD-10-CM | POA: Diagnosis not present

## 2022-11-26 DIAGNOSIS — M19011 Primary osteoarthritis, right shoulder: Secondary | ICD-10-CM | POA: Diagnosis not present

## 2022-12-04 ENCOUNTER — Other Ambulatory Visit: Payer: Self-pay | Admitting: Internal Medicine

## 2022-12-04 DIAGNOSIS — I10 Essential (primary) hypertension: Secondary | ICD-10-CM

## 2022-12-10 ENCOUNTER — Ambulatory Visit
Admission: RE | Admit: 2022-12-10 | Discharge: 2022-12-10 | Disposition: A | Discharge status: Home / Self Care | Payer: Medicare Other | Source: Ambulatory Visit | Attending: Internal Medicine | Admitting: Internal Medicine

## 2022-12-10 DIAGNOSIS — Z1231 Encounter for screening mammogram for malignant neoplasm of breast: Secondary | ICD-10-CM | POA: Insufficient documentation

## 2022-12-16 ENCOUNTER — Ambulatory Visit (INDEPENDENT_AMBULATORY_CARE_PROVIDER_SITE_OTHER): Payer: Medicare Other

## 2022-12-16 VITALS — Ht 66.0 in | Wt 167.0 lb

## 2022-12-16 DIAGNOSIS — Z0000 Encounter for general adult medical examination without abnormal findings: Secondary | ICD-10-CM | POA: Diagnosis not present

## 2022-12-16 NOTE — Progress Notes (Signed)
I connected with  [name] Routson on 12/16/22 by a audio enabled telemedicine application and verified that I am speaking with the correct person using two identifiers.  Patient Location: Home  Provider Location: Office/Clinic  I discussed the limitations of evaluation and management by telemedicine. The patient expressed understanding and agreed to proceed.  Subjective:   Joanna Sanders is a 89 y.o. female who presents for Medicare Annual (Subsequent) preventive examination.  Review of Systems     Cardiac Risk Factors include: advanced age (>66men, >69 women);dyslipidemia;hypertension     Objective:    Today's Vitals   12/16/22 1142  Weight: 167 lb (75.8 kg)  Height: 5\' 6"  (1.676 m)   Body mass index is 26.95 kg/m.     12/16/2022   11:34 AM 12/08/2021    1:13 PM 06/12/2020   10:29 AM 04/26/2019    2:24 PM 02/22/2017    2:34 PM 04/04/2015   11:01 AM 01/25/2015   10:22 AM  Advanced Directives  Does Patient Have a Medical Advance Directive? No Yes Yes Yes Yes Yes Yes  Type of Furniture conservator/restorer;Living will Healthcare Power of [place];Living will Healthcare Power of [place];Living will Healthcare Power of [place];Living will Healthcare Power of [place];Living will Healthcare Power of [place];Living will;Out of facility DNR (pink MOST or yellow form)  Copy of Healthcare Power of Attorney in Chart?  No - copy requested No - copy requested No - copy requested No - copy requested  No - copy requested  Would patient like information on creating a medical advance directive? No - Patient declined          Current Medications (verified) Outpatient Encounter Medications as of 12/16/2022  Medication Sig   acetaminophen (TYLENOL) 500 MG tablet Take 1,000 mg by mouth every 6 (six) hours as needed.   Calcium 200 MG TABS Take by mouth.   Calcium Carbonate-Vit D-Min (CALTRATE 600+D PLUS PO) Take 2 tablets by mouth daily at 2 PM.   colestipol  (COLESTID) 1 g tablet Take 1 g by mouth 2 (two) times daily. PRN   esomeprazole (NEXIUM) 40 MG capsule Take 40 mg by mouth daily at 12 noon.   Ferrous Sulfate (IRON PO) Take by mouth daily.   furosemide (LASIX) 20 MG tablet TAKE 1 TABLET BY MOUTH AS NEEDED 1-2 DAILY AS NEEDED   ketoconazole (NIZORAL) 2 % cream Apply 1 application. topically 2 (two) times daily.   Multiple Vitamin (MULTIVITAMIN) capsule Take 1 capsule by mouth daily.   Omega-3 Fatty Acids (FISH OIL) 500 MG CAPS Take by mouth.   traMADol (ULTRAM) 50 MG tablet Take 50 mg by mouth every 4 (four) hours as needed.   No facility-administered encounter medications on file as of 12/16/2022.    Allergies (verified) Nitrofuran derivatives, Cortisone, Wound dressing adhesive, Amoxicillin, Hydrochlorothiazide, and Prednisone   History: Past Medical History:  Diagnosis Date   COVID-19 10/19/2020   GERD (gastroesophageal reflux disease)    Hyperlipidemia    Hypertension    Knee joint effusion 10/12/2017   Sleep apnea    Past Surgical History:  Procedure Laterality Date   CARPAL TUNNEL RELEASE Right    CATARACT EXTRACTION     CHOLECYSTECTOMY  2013   PARATHYROIDECTOMY  08/21/2020   removed one gland   TOTAL SHOULDER REPLACEMENT Left 2013   Family History  Problem Relation Age of Onset   Hypertension Mother    Leukemia Mother    Heart failure Father    Breast  cancer Daughter 38   Social History   Socioeconomic History   Marital status: Widowed    Spouse name: Not on file   Number of children: Not on file   Years of education: Not on file   Highest education level: Not on file  Occupational History   Occupation: Retired  Tobacco Use   Smoking status: Former    Types: Cigarettes    Quit date: 07/06/1996    Years since quitting: 26.4   Smokeless tobacco: Never  Vaping Use   Vaping Use: Never used  Substance and Sexual Activity   Alcohol use: Yes    Alcohol/week: 3.0 standard drinks of alcohol    Types: 3 Glasses  of wine per week   Drug use: Never   Sexual activity: Not Currently    Partners: Male  Other Topics Concern   Not on file  Social History Narrative   Pt lives alone   Social Determinants of Health   Financial Resource Strain: Low Risk  (12/16/2022)   Overall Financial Resource Strain (CARDIA)    Difficulty of Paying Living Expenses: Not hard at all  Food Insecurity: No Food Insecurity (12/16/2022)   Hunger Vital Sign    Worried About Running Out of Food in the Last Year: Never true    Ran Out of Food in the Last Year: Never true  Transportation Needs: No Transportation Needs (12/16/2022)   PRAPARE - Administrator, Civil Service (Medical): No    Lack of Transportation (Non-Medical): No  Physical Activity: Insufficiently Active (12/16/2022)   Exercise Vital Sign    Days of Exercise per Week: 5 days    Minutes of Exercise per Session: 20 min  Stress: No Stress Concern Present (12/16/2022)   [employer] of Occupational Health - Occupational Stress Questionnaire    Feeling of Stress : Not at all  Social Connections: Moderately Integrated (12/16/2022)   Social Connection and Isolation Panel [NHANES]    Frequency of Communication with Friends and Family: More than three times a week    Frequency of Social Gatherings with Friends and Family: More than three times a week    Attends Religious Services: More than 4 times per year    Active Member of [employer] or Organizations: Yes    Attends Banker Meetings: More than 4 times per year    Marital Status: Widowed    Tobacco Counseling Counseling given: Not Answered   Clinical Intake:  Pre-visit preparation completed: Yes  Pain : No/denies pain     Nutritional Risks: None Diabetes: No  How often do you need to have someone help you when you read instructions, pamphlets, or other written materials from your doctor or pharmacy?: 1 - Never  Diabetic?no  Interpreter Needed?: No  Information entered by  :: Kennedy Bucker, LPN   Activities of Daily Living    12/16/2022   11:35 AM  In your present state of health, do you have any difficulty performing the following activities:  Hearing? 1  Vision? 0  Difficulty concentrating or making decisions? 0  Walking or climbing stairs? 0  Dressing or bathing? 0  Doing errands, shopping? 0  Preparing Food and eating ? N  Using the Toilet? N  In the past six months, have you accidently leaked urine? N  Do you have problems with loss of bowel control? N  Managing your Medications? N  Managing your Finances? N  Housekeeping or managing your Housekeeping? N    Patient Care  Team: Reubin Milan, MD as PCP - General (Internal Medicine) Valarie Cones Ihor Austin, MD as Referring Physician (Endocrinology) Oren Binet, PA (Pulmonary Disease) Daralene Milch., MD as Referring Physician (Gastroenterology)  Indicate any recent Medical Services you may have received from other than [facility] providers in the past year (date may be approximate).     Assessment:   This is a routine wellness examination for Joanna Sanders.  Hearing/Vision screen Hearing Screening - Comments:: Wears aids Vision Screening - Comments:: Readers- Dr[facility]Dobetski at [facility]  Dietary issues and exercise activities discussed: Current Exercise Habits: Home exercise routine, Type of exercise: walking, Time (Minutes): 20, Frequency (Times/Week): 5, Weekly Exercise (Minutes/Week): 100, Intensity: Mild   Goals Addressed             This Visit's Progress    DIET - EAT MORE FRUITS AND VEGETABLES         Depression Screen    12/16/2022   11:33 AM 11/09/2022    4:58 PM 07/08/2022    9:51 AM 05/07/2022   10:23 AM 12/08/2021    1:12 PM 10/31/2021    1:39 PM 10/02/2021   10:00 AM  PHQ 2/9 Scores  PHQ - 2 Score 0 0 0 1 0 0 0  PHQ- 9 Score 0 0 0 5 2 2  0    Fall Risk    12/16/2022   11:35 AM 11/09/2022    4:58 PM 07/08/2022    9:51 AM 05/07/2022   10:23 AM 12/08/2021    1:14 PM  Fall Risk    Falls in the past year? 1 0 0 0 0  Number falls in past yr: 0 0 0 0 0  Injury with Fall? 0 0 0 0 0  Risk for fall due to : History of fall(s) No Fall Risks No Fall Risks No Fall Risks No Fall Risks  Follow up Falls evaluation completed;Falls prevention discussed Falls evaluation completed Falls evaluation completed Falls evaluation completed Falls prevention discussed    FALL RISK PREVENTION PERTAINING TO THE HOME:  Any stairs in or around the home? No  If so, are there any without handrails? No  Home free of loose throw rugs in walkways, pet beds, electrical cords, etc? Yes  Adequate lighting in your home to reduce risk of falls? Yes   ASSISTIVE DEVICES UTILIZED TO PREVENT FALLS:  Life alert? No  Use of a cane, walker or w/c? No  Grab bars in the bathroom? Yes  Shower chair or bench in shower? Yes  Elevated toilet seat or a handicapped toilet? Yes   Cognitive Function:        12/16/2022   11:38 AM 06/12/2020   10:33 AM 04/26/2019    2:28 PM 04/12/2018   10:30 AM 02/22/2017    2:43 PM  6CIT Screen  What Year? 0 points 0 points 0 points 0 points 0 points  What month? 0 points 0 points 0 points 0 points 0 points  What time? 0 points 0 points 0 points 0 points 0 points  Count back from 20 0 points 0 points 0 points 0 points 0 points  Months in reverse 0 points 0 points 0 points 0 points 0 points  Repeat phrase 0 points 0 points 0 points 0 points 0 points  Total Score 0 points 0 points 0 points 0 points 0 points    Immunizations Immunization History  Administered Date(s) Administered   Fluad Quad(high Dose 65+) 04/10/2019, 04/08/2020, 05/07/2022   Influenza, High Dose Seasonal  PF 04/13/2017, 04/12/2018, 04/08/2020   Influenza,inj,Quad PF,6+ Mos 04/06/2016   Influenza-Unspecified 02/18/2015, 05/02/2021   PFIZER Comirnaty(Gray Top)Covid-19 Tri-Sucrose Vaccine 07/18/2019, 08/18/2019, 04/25/2020   PFIZER(Purple Top)SARS-COV-2 Vaccination 07/17/2019, 08/07/2019, 04/25/2020,  05/07/2020   Pfizer Covid-19 Vaccine Bivalent Booster 5y-11y 04/30/2021   Pneumococcal Conjugate-13 03/20/2014   Pneumococcal Polysaccharide-23 07/07/2005   Tdap 09/26/2015   Zoster Recombinat (Shingrix) 06/23/2018, 09/08/2018   Zoster, Live 07/08/2007    TDAP status: Up to date  Flu Vaccine status: Up to date  Pneumococcal vaccine status: Up to date  Covid-19 vaccine status: Completed vaccines  Qualifies for Shingles Vaccine? Yes   Zostavax completed Yes   Shingrix Completed?: Yes  Screening Tests Health Maintenance  Topic Date Due   COVID-19 Vaccine (9 - 2023-24 season) 03/06/2022   INFLUENZA VACCINE  02/04/2023   MAMMOGRAM  12/10/2023   Medicare Annual Wellness (AWV)  12/16/2023   DTaP/Tdap/Td (2 - Td or Tdap) 09/25/2025   Pneumonia Vaccine 45+ Years old  Completed   DEXA SCAN  Completed   Zoster Vaccines- Shingrix  Completed   HPV VACCINES  Aged Out   Colonoscopy  Discontinued    Health Maintenance  Health Maintenance Due  Topic Date Due   COVID-19 Vaccine (9 - 2023-24 season) 03/06/2022    Colorectal cancer screening: No longer required.   Mammogram status: No longer required due to age.   Lung Cancer Screening: (Low Dose CT Chest recommended if Age 108-80 years, 30 pack-year currently smoking OR have quit w/in 15years.) does not qualify.    Additional Screening:  Hepatitis C Screening: does not qualify; Completed no  Vision Screening: Recommended annual ophthalmology exams for early detection of glaucoma and other disorders of the eye. Is the patient up to date with their annual eye exam?  Yes  Who is the provider or what is the name of the office in which the patient attends annual eye exams? Dr.Dobetski If pt is not established with a provider, would they like to be referred to a provider to establish care? No .   Dental Screening: Recommended annual dental exams for proper oral hygiene  Community Resource Referral / Chronic Care Management: CRR  required this visit?  No   CCM required this visit?  No      Plan:     I have personally reviewed and noted the following in the patient's chart:   Medical and social history Use of alcohol, tobacco or illicit drugs  Current medications and supplements including opioid prescriptions. Patient is not currently taking opioid prescriptions. Functional ability and status Nutritional status Physical activity Advanced directives List of other physicians Hospitalizations, surgeries, and ER visits in previous 12 months Vitals Screenings to include cognitive, depression, and falls Referrals and appointments  In addition, I have reviewed and discussed with patient certain preventive protocols, quality metrics, and best practice recommendations. A written personalized care plan for preventive services as well as general preventive health recommendations were provided to patient.     Hal Hope, LPN   [id number]/[id number]   Nurse Notes: none

## 2022-12-16 NOTE — Patient Instructions (Signed)
Joanna Sanders , Thank you for taking time to come for your Medicare Wellness Visit. I appreciate your ongoing commitment to your health goals. Please review the following plan we discussed and let me know if I can assist you in the future.   These are the goals we discussed:  Goals      DIET - EAT MORE FRUITS AND VEGETABLES     Weight (lb) < 165 lb (74.8 kg)     Pt states she would like to lose a few pounds over the next year        This is a list of the screening recommended for you and due dates:  Health Maintenance  Topic Date Due   COVID-19 Vaccine (9 - 2023-24 season) 03/06/2022   Flu Shot  02/04/2023   Mammogram  12/10/2023   Medicare Annual Wellness Visit  12/16/2023   DTaP/Tdap/Td vaccine (2 - Td or Tdap) 09/25/2025   Pneumonia Vaccine  Completed   DEXA scan (bone density measurement)  Completed   Zoster (Shingles) Vaccine  Completed   HPV Vaccine  Aged Out   Colon Cancer Screening  Discontinued    Advanced directives: no  Conditions/risks identified: none  Next appointment: Follow up in one year for your annual wellness visit 12/22/23 @ 10:45 am by phone   Preventive Care 65 Years and Older, Female Preventive care refers to lifestyle choices and visits with your health care provider that can promote health and wellness. What does preventive care include? A yearly physical exam. This is also called an annual well check. Dental exams once or twice a year. Routine eye exams. Ask your health care provider how often you should have your eyes checked. Personal lifestyle choices, including: Daily care of your teeth and gums. Regular physical activity. Eating a healthy diet. Avoiding tobacco and drug use. Limiting alcohol use. Practicing safe sex. Taking low-dose aspirin every day. Taking vitamin and mineral supplements as recommended by your health care provider. What happens during an annual well check? The services and screenings done by your health care provider  during your annual well check will depend on your age, overall health, lifestyle risk factors, and family history of disease. Counseling  Your health care provider may ask you questions about your: Alcohol use. Tobacco use. Drug use. Emotional well-being. Home and relationship well-being. Sexual activity. Eating habits. History of falls. Memory and ability to understand (cognition). Work and work Astronomer. Reproductive health. Screening  You may have the following tests or measurements: Height, weight, and BMI. Blood pressure. Lipid and cholesterol levels. These may be checked every 5 years, or more frequently if you are over 44 years old. Skin check. Lung cancer screening. You may have this screening every year starting at age 11 if you have a 30-pack-year history of smoking and currently smoke or have quit within the past 15 years. Fecal occult blood test (FOBT) of the stool. You may have this test every year starting at age 50. Flexible sigmoidoscopy or colonoscopy. You may have a sigmoidoscopy every 5 years or a colonoscopy every 10 years starting at age 64. Hepatitis C blood test. Hepatitis B blood test. Sexually transmitted disease (STD) testing. Diabetes screening. This is done by checking your blood sugar (glucose) after you have not eaten for a while (fasting). You may have this done every 1-3 years. Bone density scan. This is done to screen for osteoporosis. You may have this done starting at age 24. Mammogram. This may be done every 1-2 years.  Talk to your health care provider about how often you should have regular mammograms. Talk with your health care provider about your test results, treatment options, and if necessary, the need for more tests. Vaccines  Your health care provider may recommend certain vaccines, such as: Influenza vaccine. This is recommended every year. Tetanus, diphtheria, and acellular pertussis (Tdap, Td) vaccine. You may need a Td booster every  10 years. Zoster vaccine. You may need this after age 11. Pneumococcal 13-valent conjugate (PCV13) vaccine. One dose is recommended after age 31. Pneumococcal polysaccharide (PPSV23) vaccine. One dose is recommended after age 103. Talk to your health care provider about which screenings and vaccines you need and how often you need them. This information is not intended to replace advice given to you by your health care provider. Make sure you discuss any questions you have with your health care provider. Document Released: 07/19/2015 Document Revised: 03/11/2016 Document Reviewed: 04/23/2015 Elsevier Interactive Patient Education  2017 [employer].  Fall Prevention in the Home Falls can cause injuries. They can happen to people of all ages. There are many things you can do to make your home safe and to help prevent falls. What can I do on the outside of my home? Regularly fix the edges of walkways and driveways and fix any cracks. Remove anything that might make you trip as you walk through a door, such as a raised step or threshold. Trim any bushes or trees on the path to your home. Use bright outdoor lighting. Clear any walking paths of anything that might make someone trip, such as rocks or tools. Regularly check to see if handrails are loose or broken. Make sure that both sides of any steps have handrails. Any raised decks and porches should have guardrails on the edges. Have any leaves, snow, or ice cleared regularly. Use sand or salt on walking paths during winter. Clean up any spills in your garage right away. This includes oil or grease spills. What can I do in the bathroom? Use night lights. Install grab bars by the toilet and in the tub and shower. Do not use towel bars as grab bars. Use non-skid mats or decals in the tub or shower. If you need to sit down in the shower, use a plastic, non-slip stool. Keep the floor dry. Clean up any water that spills on the floor as soon as it  happens. Remove soap buildup in the tub or shower regularly. Attach bath mats securely with double-sided non-slip rug tape. Do not have throw rugs and other things on the floor that can make you trip. What can I do in the bedroom? Use night lights. Make sure that you have a light by your bed that is easy to reach. Do not use any sheets or blankets that are too big for your bed. They should not hang down onto the floor. Have a firm chair that has side arms. You can use this for support while you get dressed. Do not have throw rugs and other things on the floor that can make you trip. What can I do in the kitchen? Clean up any spills right away. Avoid walking on wet floors. Keep items that you use a lot in easy-to-reach places. If you need to reach something above you, use a strong step stool that has a grab bar. Keep electrical cords out of the way. Do not use floor polish or wax that makes floors slippery. If you must use wax, use non-skid floor wax.  Do not have throw rugs and other things on the floor that can make you trip. What can I do with my stairs? Do not leave any items on the stairs. Make sure that there are handrails on both sides of the stairs and use them. Fix handrails that are broken or loose. Make sure that handrails are as long as the stairways. Check any carpeting to make sure that it is firmly attached to the stairs. Fix any carpet that is loose or worn. Avoid having throw rugs at the top or bottom of the stairs. If you do have throw rugs, attach them to the floor with carpet tape. Make sure that you have a light switch at the top of the stairs and the bottom of the stairs. If you do not have them, ask someone to add them for you. What else can I do to help prevent falls? Wear shoes that: Do not have high heels. Have rubber bottoms. Are comfortable and fit you well. Are closed at the toe. Do not wear sandals. If you use a stepladder: Make sure that it is fully opened.  Do not climb a closed stepladder. Make sure that both sides of the stepladder are locked into place. Ask someone to hold it for you, if possible. Clearly mark and make sure that you can see: Any grab bars or handrails. First and last steps. Where the edge of each step is. Use tools that help you move around (mobility aids) if they are needed. These include: Canes. Walkers. Scooters. Crutches. Turn on the lights when you go into a dark area. Replace any light bulbs as soon as they burn out. Set up your furniture so you have a clear path. Avoid moving your furniture around. If any of your floors are uneven, fix them. If there are any pets around you, be aware of where they are. Review your medicines with your doctor. Some medicines can make you feel dizzy. This can increase your chance of falling. Ask your doctor what other things that you can do to help prevent falls. This information is not intended to replace advice given to you by your health care provider. Make sure you discuss any questions you have with your health care provider. Document Released: 04/18/2009 Document Revised: 11/28/2015 Document Reviewed: 07/27/2014 Elsevier Interactive Patient Education  2017 [employer].

## 2022-12-30 DIAGNOSIS — K080 Exfoliation of teeth due to systemic causes: Secondary | ICD-10-CM | POA: Diagnosis not present

## 2023-01-11 DIAGNOSIS — M25561 Pain in right knee: Secondary | ICD-10-CM | POA: Diagnosis not present

## 2023-02-03 DIAGNOSIS — K080 Exfoliation of teeth due to systemic causes: Secondary | ICD-10-CM | POA: Diagnosis not present

## 2023-02-08 DIAGNOSIS — Z872 Personal history of diseases of the skin and subcutaneous tissue: Secondary | ICD-10-CM | POA: Diagnosis not present

## 2023-02-08 DIAGNOSIS — B353 Tinea pedis: Secondary | ICD-10-CM | POA: Diagnosis not present

## 2023-02-08 DIAGNOSIS — L570 Actinic keratosis: Secondary | ICD-10-CM | POA: Diagnosis not present

## 2023-02-08 DIAGNOSIS — L298 Other pruritus: Secondary | ICD-10-CM | POA: Diagnosis not present

## 2023-02-08 DIAGNOSIS — L578 Other skin changes due to chronic exposure to nonionizing radiation: Secondary | ICD-10-CM | POA: Diagnosis not present

## 2023-02-25 DIAGNOSIS — M19011 Primary osteoarthritis, right shoulder: Secondary | ICD-10-CM | POA: Diagnosis not present

## 2023-05-06 DIAGNOSIS — K080 Exfoliation of teeth due to systemic causes: Secondary | ICD-10-CM | POA: Diagnosis not present

## 2023-05-11 ENCOUNTER — Ambulatory Visit (INDEPENDENT_AMBULATORY_CARE_PROVIDER_SITE_OTHER): Payer: Medicare Other | Admitting: Internal Medicine

## 2023-05-11 ENCOUNTER — Encounter: Payer: Self-pay | Admitting: Internal Medicine

## 2023-05-11 VITALS — BP 122/64 | HR 83 | Ht 66.0 in | Wt 173.0 lb

## 2023-05-11 DIAGNOSIS — Z0000 Encounter for general adult medical examination without abnormal findings: Secondary | ICD-10-CM | POA: Diagnosis not present

## 2023-05-11 DIAGNOSIS — Z23 Encounter for immunization: Secondary | ICD-10-CM | POA: Diagnosis not present

## 2023-05-11 DIAGNOSIS — G4733 Obstructive sleep apnea (adult) (pediatric): Secondary | ICD-10-CM | POA: Diagnosis not present

## 2023-05-11 DIAGNOSIS — K219 Gastro-esophageal reflux disease without esophagitis: Secondary | ICD-10-CM | POA: Diagnosis not present

## 2023-05-11 DIAGNOSIS — E782 Mixed hyperlipidemia: Secondary | ICD-10-CM | POA: Diagnosis not present

## 2023-05-11 DIAGNOSIS — M810 Age-related osteoporosis without current pathological fracture: Secondary | ICD-10-CM | POA: Diagnosis not present

## 2023-05-11 DIAGNOSIS — I10 Essential (primary) hypertension: Secondary | ICD-10-CM

## 2023-05-11 DIAGNOSIS — D518 Other vitamin B12 deficiency anemias: Secondary | ICD-10-CM

## 2023-05-11 DIAGNOSIS — Z1231 Encounter for screening mammogram for malignant neoplasm of breast: Secondary | ICD-10-CM

## 2023-05-11 DIAGNOSIS — I700 Atherosclerosis of aorta: Secondary | ICD-10-CM

## 2023-05-11 DIAGNOSIS — E892 Postprocedural hypoparathyroidism: Secondary | ICD-10-CM | POA: Insufficient documentation

## 2023-05-11 NOTE — Assessment & Plan Note (Addendum)
Not currently on supplements Complains of fatigue but no chest pain or shortness of breath Recommend starting an oral B12 daily

## 2023-05-11 NOTE — Progress Notes (Signed)
Date:  05/11/2023   Name:  Joanna Sanders   DOB:  July 20, 1929   MRN:  [id number]   Chief Complaint: Annual Exam [name] Joanna Sanders is a 89 y.o. female who presents today for her Complete Annual Exam. She feels well. She reports exercising - some. She reports she is sleeping poorly. Breast complaints - none.  She complains of fatigue - legs feel heavy but no chest pain or shortness of breath, no palpitations or lightheadedness.  Mammogram: 12/2022 DEXA: 09/2022 osteopenia Colonoscopy: aged out   Health Maintenance Due  Topic Date Due   COVID-19 Vaccine (9 - 2023-24 season) 03/07/2023    Immunization History  Administered Date(s) Administered   Fluad Quad(high Dose 65+) 04/10/2019, 04/08/2020, 05/07/2022   Fluad Trivalent(High Dose 65+) 05/11/2023   Influenza, High Dose Seasonal PF 04/13/2017, 04/12/2018, 04/08/2020   Influenza,inj,Quad PF,6+ Mos 04/06/2016   Influenza-Unspecified 02/18/2015, 05/02/2021   PFIZER Comirnaty(Gray Top)Covid-19 Tri-Sucrose Vaccine 07/18/2019, 08/18/2019, 04/25/2020   PFIZER(Purple Top)SARS-COV-2 Vaccination 07/17/2019, 08/07/2019, 04/25/2020, 05/07/2020   Pfizer Covid-19 Vaccine Bivalent Booster 5y-11y 04/30/2021   Pneumococcal Conjugate-13 03/20/2014   Pneumococcal Polysaccharide-23 07/07/2005   Tdap 09/26/2015   Zoster Recombinant(Shingrix) 06/23/2018, 09/08/2018   Zoster, Live 07/08/2007     Hypertension This is a chronic problem. The problem is controlled. Pertinent negatives include no chest pain, headaches, palpitations or shortness of breath. Past treatments include diuretics.  Gastroesophageal Reflux She complains of heartburn. She reports no abdominal pain, no chest pain, no coughing or no wheezing. This is a recurrent problem. The problem occurs occasionally. Pertinent negatives include no fatigue. She has tried a PPI for the symptoms. The treatment provided significant relief.  Hyperlipidemia This is a chronic problem. Recent lipid  tests were reviewed and are variable. Pertinent negatives include no chest pain or shortness of breath. Current antihyperlipidemic treatment includes diet change and bile acid sequestrants. The current treatment provides moderate improvement of lipids.  OSA - on CPap but not using nightly.  She does have fatigue and might benefit from more regular use of CPAP.   Review of Systems  Constitutional:  Negative for chills, fatigue and fever.  HENT:  Negative for congestion, hearing loss, tinnitus, trouble swallowing and voice change.   Eyes:  Negative for visual disturbance.  Respiratory:  Negative for cough, chest tightness, shortness of breath and wheezing.   Cardiovascular:  Negative for chest pain, palpitations and leg swelling.  Gastrointestinal:  Positive for heartburn. Negative for abdominal pain, constipation, diarrhea and vomiting.  Endocrine: Negative for polydipsia and polyuria.  Genitourinary:  Negative for dysuria, frequency, genital sores, vaginal bleeding and vaginal discharge.  Musculoskeletal:  Negative for arthralgias, gait problem and joint swelling.  Skin:  Negative for color change and rash.  Neurological:  Negative for dizziness, tremors, light-headedness and headaches.  Hematological:  Negative for adenopathy. Does not bruise/bleed easily.  Psychiatric/Behavioral:  Negative for dysphoric mood and sleep disturbance. The patient is not nervous/anxious.      Lab Results  Component Value Date   NA 140 11/09/2022   K 4.7 11/09/2022   CO2 26 11/09/2022   GLUCOSE 103 (H) 11/09/2022   BUN 19 11/09/2022   CREATININE 1.02 (H) 11/09/2022   CALCIUM 10.3 11/09/2022   EGFR 52 (L) 11/09/2022   GFRNONAA 59 (L) 07/15/2021   Lab Results  Component Value Date   CHOL 209 (H) 05/07/2022   HDL 56 05/07/2022   LDLCALC 131 (H) 05/07/2022   TRIG 126 05/07/2022   CHOLHDL 3.7 05/07/2022  Lab Results  Component Value Date   TSH 1.510 05/07/2022   Lab Results  Component Value Date    HGBA1C 5.8 (H) 05/07/2022   Lab Results  Component Value Date   WBC 7.5 11/09/2022   HGB 12.1 11/09/2022   HCT 35.0 11/09/2022   MCV 91 11/09/2022   PLT 313 11/09/2022   Lab Results  Component Value Date   ALT 14 05/07/2022   AST 19 05/07/2022   ALKPHOS 92 05/07/2022   BILITOT 0.5 05/07/2022   Lab Results  Component Value Date   VD25OH 47 09/10/2022     Patient Active Problem List   Diagnosis Date Noted   Aortic atherosclerosis (HCC) 10/15/2020   S/P parathyroidectomy 10/15/2020   Anemia, vitamin B12 deficiency 06/24/2020   IPMN (intraductal papillary mucinous neoplasm) 06/06/2018   Postcholecystectomy diarrhea 06/06/2018   Plantar fascial fibromatosis 10/12/2017   Osteoarthritis of knee 10/12/2017   OSA (obstructive sleep apnea) 09/18/2016   Localized primary osteoarthritis of left shoulder region 06/18/2016   Abdominal pain, chronic, right upper quadrant 04/29/2016   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 Not 100% certain which antibiotic.     Hydrochlorothiazide     hypercalcemia   Prednisone Other (See Comments)    Gets really hyper    Past Surgical History:  Procedure Laterality Date   CARPAL TUNNEL RELEASE Right    CATARACT EXTRACTION     CHOLECYSTECTOMY  2013   PARATHYROIDECTOMY  08/21/2020   removed one gland   TOTAL SHOULDER REPLACEMENT Left 2013    Social History   Tobacco Use   Smoking status: Former    Current packs/day: 0.00    Types: Cigarettes    Quit date: 07/06/1996    Years since quitting:  26.8   Smokeless tobacco: Never  Vaping Use   Vaping status: Never Used  Substance Use Topics   Alcohol use: Yes    Alcohol/week: 3.0 standard drinks of alcohol    Types: 3 Glasses of wine per week   Drug use: Never     Medication list has been reviewed and updated.  Current Meds  Medication Sig   acetaminophen (TYLENOL) 500 MG tablet Take 1,000 mg by mouth every 6 (six) hours as needed.   Calcium 200 MG TABS Take by mouth.   Calcium Carbonate-Vit D-Min (CALTRATE 600+D PLUS PO) Take 2 tablets by mouth daily at 2 PM.   colestipol (COLESTID) 1 g tablet Take 1 g by mouth 2 (two) times daily. PRN   esomeprazole (NEXIUM) 40 MG capsule Take 40 mg by mouth daily at 12 noon.   Ferrous Sulfate (IRON PO) Take by mouth daily.   furosemide (LASIX) 20 MG tablet TAKE 1 TABLET BY MOUTH AS NEEDED 1-2 DAILY AS NEEDED   ketoconazole (NIZORAL) 2 % cream Apply 1 application. topically 2 (two) times daily.   Multiple Vitamin (MULTIVITAMIN) capsule Take 1 capsule by mouth daily.   Omega-3 Fatty Acids (FISH OIL) 500 MG CAPS Take by mouth.   traMADol (ULTRAM) 50 MG tablet Take 50 mg by mouth every 4 (four) hours as needed.  05/11/2023   10:50 AM 11/09/2022    4:58 PM 07/08/2022    9:51 AM 05/07/2022   10:23 AM  GAD 7 : Generalized Anxiety Score  Nervous, Anxious, on Edge 0 0 0 0  Control/stop worrying 0 0 0 0  Worry too much - different things 0 0 0 0  Trouble relaxing 0 0 0 0  Restless 0 0 0 0  Easily annoyed or irritable 0 0 0 0  Afraid - awful might happen 0 0 0 0  Total GAD 7 Score 0 0 0 0  Anxiety Difficulty Not difficult at all Not difficult at all Not difficult at all Not difficult at all       05/11/2023   10:50 AM 12/16/2022   11:33 AM 11/09/2022    4:58 PM  Depression screen PHQ 2/9  Decreased Interest 0 0 0  Down, Depressed, Hopeless 0 0 0  PHQ - 2 Score 0 0 0  Altered sleeping 1 0 0  Tired, decreased energy 2 0 0  Change in appetite 0 0 0  Feeling bad or failure about  yourself  0 0 0  Trouble concentrating 0 0 0  Moving slowly or fidgety/restless 0 0 0  Suicidal thoughts 0 0 0  PHQ-9 Score 3 0 0  Difficult doing work/chores  Not difficult at all Not difficult at all    BP Readings from Last 3 Encounters:  05/11/23 122/64  11/09/22 128/74  07/08/22 129/72    Physical Exam Vitals and nursing note reviewed.  Constitutional:      General: She is not in acute distress.    Appearance: She is well-developed.  HENT:     Head: Normocephalic and atraumatic.     Right Ear: Tympanic membrane and ear canal normal.     Left Ear: Tympanic membrane and ear canal normal.     Nose:     Right Sinus: No maxillary sinus tenderness.     Left Sinus: No maxillary sinus tenderness.  Eyes:     General: No scleral icterus.       Right eye: No discharge.        Left eye: No discharge.     Conjunctiva/sclera: Conjunctivae normal.  Neck:     Thyroid: No thyromegaly.     Vascular: No carotid bruit.  Cardiovascular:     Rate and Rhythm: Normal rate and regular rhythm.     Pulses: Normal pulses.          Dorsalis pedis pulses are 2+ on the right side and 2+ on the left side.       Posterior tibial pulses are 2+ on the right side and 2+ on the left side.     Heart sounds: Normal heart sounds.  Pulmonary:     Effort: Pulmonary effort is normal. No respiratory distress.     Breath sounds: No wheezing.  Abdominal:     General: Bowel sounds are normal.     Palpations: Abdomen is soft.     Tenderness: There is no abdominal tenderness.  Musculoskeletal:     Cervical back: Normal range of motion. No erythema.     Right lower leg: No edema.     Left lower leg: No edema.  Lymphadenopathy:     Cervical: No cervical adenopathy.  Skin:    General: Skin is warm and dry.     Capillary Refill: Capillary refill takes less than 2 seconds.     Findings: No rash.  Neurological:  General: No focal deficit present.     Mental Status: She is alert and oriented to person,  place, and time.     Cranial Nerves: No cranial nerve deficit.     Sensory: No sensory deficit.     Deep Tendon Reflexes: Reflexes are normal and symmetric.  Psychiatric:        Attention and Perception: Attention normal.        Mood and Affect: Mood normal.        Behavior: Behavior normal.     Wt Readings from Last 3 Encounters:  05/11/23 173 lb (78.5 kg)  12/16/22 167 lb (75.8 kg)  11/09/22 167 lb (75.8 kg)    BP 122/64   Pulse 83   Ht 5\' 6"  (1.676 m)   Wt 173 lb (78.5 kg)   SpO2 99%   BMI 27.92 kg/m   Assessment and Plan:  Problem List Items Addressed This Visit       Unprioritized   Aortic atherosclerosis (HCC) (Chronic)   Benign essential HTN (Chronic)    Controlled BP with normal exam. Current regimen is lasix. Will continue same medications; encourage continued reduced sodium diet.       Relevant Orders   CBC with Differential/Platelet   Comprehensive metabolic panel   TSH   Urinalysis, Routine w reflex microscopic   Hyperlipidemia, mixed (Chronic)    Managed with diet and lifestyle changes. Lab Results  Component Value Date   LDLCALC 131 (H) 05/07/2022         Relevant Orders   Lipid panel   Gastroesophageal reflux disease (Chronic)    Reflux symptoms are minimal on current therapy - Nexium. No red flag signs such as weight loss, n/v, melena       Relevant Orders   CBC with Differential/Platelet   Age-related osteoporosis without current pathological fracture (Chronic)    Followed by [facility]. Recent Vitamin D = 47 DEXA 09/2022 - osteopenia PTH 82      OSA (obstructive sleep apnea) (Chronic)    CPAP prescribed about 7 years ago but unable to tolerate the mask. She does have a nasal pillow device. Evening somnolence, no headaches or mood disorder.  Also fatigue with prolonged walking. Encourage another trial of nasal pillows - aim for 4 hours per night and try for at least a month      Anemia, vitamin B12 deficiency (Chronic)     Not currently on supplements Complains of fatigue but no chest pain or shortness of breath Recommend starting an oral B12 daily      Other Visit Diagnoses     Annual physical exam    -  Primary   normal exam for age continue activity as tolerated healthy diet choices   Encounter for screening mammogram for breast cancer       due in June 2025   Need for influenza vaccination       Relevant Orders   Flu Vaccine Trivalent High Dose (Fluad) (Completed)       Return in about 4 months (around 09/08/2023) for HTN.    Reubin Milan, MD [facility] [place]

## 2023-05-11 NOTE — Assessment & Plan Note (Signed)
Controlled BP with normal exam. Current regimen is lasix. Will continue same medications; encourage continued reduced sodium diet.

## 2023-05-11 NOTE — Assessment & Plan Note (Addendum)
Followed by [facility]. Recent Vitamin D = 47 DEXA 09/2022 - osteopenia PTH 82

## 2023-05-11 NOTE — Assessment & Plan Note (Addendum)
CPAP prescribed about 7 years ago but unable to tolerate the mask. She does have a nasal pillow device. Evening somnolence, no headaches or mood disorder.  Also fatigue with prolonged walking. Encourage another trial of nasal pillows - aim for 4 hours per night and try for at least a month

## 2023-05-11 NOTE — Assessment & Plan Note (Signed)
Managed with diet and lifestyle changes. Lab Results  Component Value Date   LDLCALC 131 (H) 05/07/2022

## 2023-05-11 NOTE — Assessment & Plan Note (Signed)
Reflux symptoms are minimal on current therapy - Nexium. No red flag signs such as weight loss, n/v, melena

## 2023-05-11 NOTE — Patient Instructions (Signed)
Start taking a B12 oral supplement daily.

## 2023-05-12 LAB — CBC WITH DIFFERENTIAL/PLATELET
Basophils Absolute: 0.1 10*3/uL (ref 0.0–0.2)
Basos: 1 %
EOS (ABSOLUTE): 0.1 10*3/uL (ref 0.0–0.4)
Eos: 2 %
Hematocrit: 36.2 % (ref 34.0–46.6)
Hemoglobin: 12.1 g/dL (ref 11.1–15.9)
Immature Grans (Abs): 0 10*3/uL (ref 0.0–0.1)
Immature Granulocytes: 1 %
Lymphocytes Absolute: 1.9 10*3/uL (ref 0.7–3.1)
Lymphs: 31 %
MCH: 32 pg (ref 26.6–33.0)
MCHC: 33.4 g/dL (ref 31.5–35.7)
MCV: 96 fL (ref 79–97)
Monocytes Absolute: 0.7 10*3/uL (ref 0.1–0.9)
Monocytes: 11 %
Neutrophils Absolute: 3.4 10*3/uL (ref 1.4–7.0)
Neutrophils: 54 %
Platelets: 235 10*3/uL (ref 150–450)
RBC: 3.78 x10E6/uL (ref 3.77–5.28)
RDW: 13.2 % (ref 11.7–15.4)
WBC: 6.2 10*3/uL (ref 3.4–10.8)

## 2023-05-12 LAB — URINALYSIS, ROUTINE W REFLEX MICROSCOPIC
Bilirubin, UA: NEGATIVE
Glucose, UA: NEGATIVE
Ketones, UA: NEGATIVE
Leukocytes,UA: NEGATIVE
Nitrite, UA: NEGATIVE
Protein,UA: NEGATIVE
RBC, UA: NEGATIVE
Specific Gravity, UA: 1.016 (ref 1.005–1.030)
Urobilinogen, Ur: 0.2 mg/dL (ref 0.2–1.0)
pH, UA: 8 — ABNORMAL HIGH (ref 5.0–7.5)

## 2023-05-12 LAB — COMPREHENSIVE METABOLIC PANEL
ALT: 15 [IU]/L (ref 0–32)
AST: 21 [IU]/L (ref 0–40)
Albumin: 4.5 g/dL (ref 3.6–4.6)
Alkaline Phosphatase: 92 [IU]/L (ref 44–121)
BUN/Creatinine Ratio: 19 (ref 12–28)
BUN: 20 mg/dL (ref 10–36)
Bilirubin Total: 0.5 mg/dL (ref 0.0–1.2)
CO2: 25 mmol/L (ref 20–29)
Calcium: 9.7 mg/dL (ref 8.7–10.3)
Chloride: 101 mmol/L (ref 96–106)
Creatinine, Ser: 1.04 mg/dL — ABNORMAL HIGH (ref 0.57–1.00)
Globulin, Total: 2.1 g/dL (ref 1.5–4.5)
Glucose: 122 mg/dL — ABNORMAL HIGH (ref 70–99)
Potassium: 4.2 mmol/L (ref 3.5–5.2)
Sodium: 138 mmol/L (ref 134–144)
Total Protein: 6.6 g/dL (ref 6.0–8.5)
eGFR: 51 mL/min/{1.73_m2} — ABNORMAL LOW (ref >59)

## 2023-05-12 LAB — LIPID PANEL
Chol/HDL Ratio: 4.2 ratio (ref 0.0–4.4)
Cholesterol, Total: 200 mg/dL — ABNORMAL HIGH (ref 100–199)
HDL: 48 mg/dL (ref >39)
LDL Chol Calc (NIH): 115 mg/dL — ABNORMAL HIGH (ref 0–99)
Triglycerides: 210 mg/dL — ABNORMAL HIGH (ref 0–149)
VLDL Cholesterol Cal: 37 mg/dL (ref 5–40)

## 2023-05-12 LAB — TSH: TSH: 1.31 u[IU]/mL (ref 0.450–4.500)

## 2023-05-23 ENCOUNTER — Other Ambulatory Visit: Payer: Self-pay | Admitting: Internal Medicine

## 2023-05-23 DIAGNOSIS — I10 Essential (primary) hypertension: Secondary | ICD-10-CM

## 2023-05-24 NOTE — Telephone Encounter (Signed)
Requested Prescriptions  Pending Prescriptions Disp Refills   furosemide (LASIX) 20 MG tablet [Pharmacy Med Name: FUROSEMIDE 20MG  TABLETS] 180 tablet 1    Sig: TAKE 1 TABLET BY MOUTH EVERY DAY, TAKE 1 TO 2 DAILY AS NEEDED     Cardiovascular:  Diuretics - Loop Failed - 05/23/2023  8:15 AM      Failed - Cr in normal range and within 180 days    Creatinine, Ser  Date Value Ref Range Status  05/11/2023 1.04 (H) 0.57 - 1.00 mg/dL Final         Failed - Mg Level in normal range and within 180 days    No results found for: "MG"       Passed - K in normal range and within 180 days    Potassium  Date Value Ref Range Status  05/11/2023 4.2 3.5 - 5.2 mmol/L Final         Passed - Ca in normal range and within 180 days    Calcium  Date Value Ref Range Status  05/11/2023 9.7 8.7 - 10.3 mg/dL Final         Passed - Na in normal range and within 180 days    Sodium  Date Value Ref Range Status  05/11/2023 138 134 - 144 mmol/L Final         Passed - Cl in normal range and within 180 days    Chloride  Date Value Ref Range Status  05/11/2023 101 96 - 106 mmol/L Final         Passed - Last BP in normal range    BP Readings from Last 1 Encounters:  05/11/23 122/64         Passed - Valid encounter within last 6 months    Recent Outpatient Visits           1 week ago Annual physical exam   [place] [facility] at [facility], Nyoka Cowden, MD   6 months ago Chronic epigastric pain   [place] [facility] at [facility], Nyoka Cowden, MD   10 months ago Flu-like symptoms   [place] [facility] at [facility], Nyoka Cowden, MD   1 year ago Annual physical exam   [facility] at [facility], Nyoka Cowden, MD   1 year ago Benign essential HTN   [place] [facility] at [facility], Nyoka Cowden, MD       Future Appointments              In 3 months Judithann Graves, Nyoka Cowden, MD [facility] at [facility], [facility]   In 11 months Judithann Graves, Nyoka Cowden, MD [facility] at [facility], [facility]

## 2023-05-27 DIAGNOSIS — M19011 Primary osteoarthritis, right shoulder: Secondary | ICD-10-CM | POA: Diagnosis not present

## 2023-06-11 DIAGNOSIS — M79604 Pain in right leg: Secondary | ICD-10-CM | POA: Diagnosis not present

## 2023-06-11 DIAGNOSIS — M67873 Other specified disorders of tendon, right ankle and foot: Secondary | ICD-10-CM | POA: Diagnosis not present

## 2023-07-09 DIAGNOSIS — M7661 Achilles tendinitis, right leg: Secondary | ICD-10-CM | POA: Diagnosis not present

## 2023-07-09 DIAGNOSIS — M6281 Muscle weakness (generalized): Secondary | ICD-10-CM | POA: Diagnosis not present

## 2023-07-14 DIAGNOSIS — M6281 Muscle weakness (generalized): Secondary | ICD-10-CM | POA: Diagnosis not present

## 2023-07-14 DIAGNOSIS — M7661 Achilles tendinitis, right leg: Secondary | ICD-10-CM | POA: Diagnosis not present

## 2023-07-27 DIAGNOSIS — M6281 Muscle weakness (generalized): Secondary | ICD-10-CM | POA: Diagnosis not present

## 2023-07-27 DIAGNOSIS — M7661 Achilles tendinitis, right leg: Secondary | ICD-10-CM | POA: Diagnosis not present

## 2023-08-19 ENCOUNTER — Ambulatory Visit: Payer: Self-pay

## 2023-08-19 NOTE — Telephone Encounter (Signed)
  Chief Complaint: stye Symptoms: stye L lower eyelid, redness, pus at times Frequency: 5 days  Pertinent Negatives: Patient denies pain or blurred vision  Disposition: [] ED /[] Urgent Care (no appt availability in office) / [x] Appointment(In office/virtual)/ []  [place] [facility]/ [] Home Care/ [] Refused Recommended Disposition /[] [place] [facility]/ []  Follow-up with PCP Additional Notes: pt states she went to daughter's house over the weekend and noted the stye. Daughter had new tube of eye ointment and used while there and did help some but was unable to bring home so had been doing compresses and says helps for short period. Eye worse in the morning. Scheduled OV for tomorrow at 1020 with PCP. Care advice given and pt verbalized understanding.   Summary: stye on eye   Pt has a stye on left eye that is not going away. Pt has had it for about 5 days and has been doing cold compressions about 3/4 times a day. Pt denies pain.  Pt requesting rx to help with stye.         Reason for Disposition  [1] After 5 days of treatment per [facility] Advice AND [2] not better  Answer Assessment - Initial Assessment Questions 1. LOCATION: "Which eye has the sty?" "Upper or lower eyelid?"     Left lower eyelid  2. SIZE: "How big is it?" (Note: standard pencil eraser is 6 mm)     Grain of rice  3. EYELID: "Is the eyelid swollen?" If Yes, ask: "How much?"     no 4. REDNESS: "Has the redness spread onto the eyelid?"     slight 5. ONSET: "When did you notice the sty?"     5 days  6. VISION: "Do you have blurred vision?"      no 7. PAIN: "Is it painful?" If Yes, ask: "How bad is the pain?"  (Scale 1-10; or mild, moderate, severe)     No pain  9. OTHER SYMPTOMS: "Do you have any other symptoms?" (e.g., fever)     no  Protocols used: Sty-A-AH

## 2023-08-20 ENCOUNTER — Encounter: Payer: Self-pay | Admitting: Internal Medicine

## 2023-08-20 ENCOUNTER — Ambulatory Visit: Payer: Medicare Other | Admitting: Internal Medicine

## 2023-08-20 VITALS — BP 134/62 | HR 69 | Ht 66.0 in | Wt 175.0 lb

## 2023-08-20 DIAGNOSIS — H00015 Hordeolum externum left lower eyelid: Secondary | ICD-10-CM

## 2023-08-20 MED ORDER — ERYTHROMYCIN 5 MG/GM OP OINT: 1.0000 | TOPICAL_OINTMENT | Freq: Every day | OPHTHALMIC | 0 refills | Status: DC

## 2023-08-20 NOTE — Progress Notes (Signed)
Date:  08/20/2023   Name:  Joanna Sanders   DOB:  1929/09/07   MRN:  [id number]   Chief Complaint: Eye Problem (X6 days,Stye left eye, redness, getting a little better)  Eye Pain  The left eye is affected. This is a new problem. The current episode started in the past 7 days. The problem occurs constantly. The problem has been gradually improving. There was no injury mechanism. The pain is mild. Pertinent negatives include no eye discharge, fever, itching or photophobia.    Review of Systems  Constitutional:  Negative for chills, fatigue and fever.  Eyes:  Positive for pain. Negative for photophobia, discharge and itching.  Respiratory:  Negative for chest tightness and shortness of breath.   Musculoskeletal:  Positive for arthralgias and gait problem.  Neurological:  Negative for dizziness and headaches.  Psychiatric/Behavioral:  Negative for sleep disturbance.      Lab Results  Component Value Date   NA 138 05/11/2023   K 4.2 05/11/2023   CO2 25 05/11/2023   GLUCOSE 122 (H) 05/11/2023   BUN 20 05/11/2023   CREATININE 1.04 (H) 05/11/2023   CALCIUM 9.7 05/11/2023   EGFR 51 (L) 05/11/2023   GFRNONAA 59 (L) 07/15/2021   Lab Results  Component Value Date   CHOL 200 (H) 05/11/2023   HDL 48 05/11/2023   LDLCALC 115 (H) 05/11/2023   TRIG 210 (H) 05/11/2023   CHOLHDL 4.2 05/11/2023   Lab Results  Component Value Date   TSH 1.310 05/11/2023   Lab Results  Component Value Date   HGBA1C 5.8 (H) 05/07/2022   Lab Results  Component Value Date   WBC 6.2 05/11/2023   HGB 12.1 05/11/2023   HCT 36.2 05/11/2023   MCV 96 05/11/2023   PLT 235 05/11/2023   Lab Results  Component Value Date   ALT 15 05/11/2023   AST 21 05/11/2023   ALKPHOS 92 05/11/2023   BILITOT 0.5 05/11/2023   Lab Results  Component Value Date   VD25OH 47 09/10/2022     Patient Active Problem List   Diagnosis Date Noted   Aortic atherosclerosis (HCC) 10/15/2020   S/P parathyroidectomy  10/15/2020   Anemia, vitamin B12 deficiency 06/24/2020   IPMN (intraductal papillary mucinous neoplasm) 06/06/2018   Postcholecystectomy diarrhea 06/06/2018   Plantar fascial fibromatosis 10/12/2017   Osteoarthritis of knee 10/12/2017   OSA (obstructive sleep apnea) 09/18/2016   Localized primary osteoarthritis of left shoulder region 06/18/2016   Abdominal pain, chronic, right upper quadrant 04/29/2016   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 Not 100% certain which antibiotic.     Hydrochlorothiazide     hypercalcemia   Prednisone Other (See Comments)    Gets really hyper    Past Surgical History:  Procedure Laterality Date   CARPAL TUNNEL RELEASE Right    CATARACT EXTRACTION     CHOLECYSTECTOMY  2013   PARATHYROIDECTOMY  08/21/2020   removed one gland   TOTAL SHOULDER REPLACEMENT Left 2013    Social History   Tobacco Use   Smoking status: Former    Current  packs/day: 0.00    Types: Cigarettes    Quit date: 07/06/1996    Years since quitting: 27.1   Smokeless tobacco: Never  Vaping Use   Vaping status: Never Used  Substance Use Topics   Alcohol use: Yes    Alcohol/week: 3.0 standard drinks of alcohol    Types: 3 Glasses of wine per week   Drug use: Never     Medication list has been reviewed and updated.  Current Meds  Medication Sig   acetaminophen (TYLENOL) 500 MG tablet Take 1,000 mg by mouth every 6 (six) hours as needed.   Calcium 200 MG TABS Take by mouth.   Calcium Carbonate-Vit D-Min (CALTRATE 600+D PLUS PO)  Take 2 tablets by mouth daily at 2 PM.   colestipol (COLESTID) 1 g tablet Take 1 g by mouth 2 (two) times daily. PRN   erythromycin ophthalmic ointment Place 1 Application into the left eye at bedtime.   esomeprazole (NEXIUM) 40 MG capsule Take 40 mg by mouth daily at 12 noon.   Ferrous Sulfate (IRON PO) Take by mouth daily.   furosemide (LASIX) 20 MG tablet TAKE 1 TABLET BY MOUTH EVERY DAY, TAKE 1 TO 2 DAILY AS NEEDED   ketoconazole (NIZORAL) 2 % cream Apply 1 application. topically 2 (two) times daily.   Multiple Vitamin (MULTIVITAMIN) capsule Take 1 capsule by mouth daily.   Omega-3 Fatty Acids (FISH OIL) 500 MG CAPS Take by mouth.   traMADol (ULTRAM) 50 MG tablet Take 50 mg by mouth every 4 (four) hours as needed.       08/20/2023   10:29 AM 05/11/2023   10:50 AM 11/09/2022    4:58 PM 07/08/2022    9:51 AM  GAD 7 : Generalized Anxiety Score  Nervous, Anxious, on Edge 0 0 0 0  Control/stop worrying 0 0 0 0  Worry too much - different things 0 0 0 0  Trouble relaxing 0 0 0 0  Restless 0 0 0 0  Easily annoyed or irritable 0 0 0 0  Afraid - awful might happen 0 0 0 0  Total GAD 7 Score 0 0 0 0  Anxiety Difficulty Not difficult at all Not difficult at all Not difficult at all Not difficult at all       08/20/2023   10:29 AM 05/11/2023   10:50 AM 12/16/2022   11:33 AM  Depression screen PHQ 2/9  Decreased Interest 0 0 0  Down, Depressed, Hopeless 0 0 0  PHQ - 2 Score 0 0 0  Altered sleeping  1 0  Tired, decreased energy  2 0  Change in appetite  0 0  Feeling bad or failure about yourself   0 0  Trouble concentrating  0 0  Moving slowly or fidgety/restless  0 0  Suicidal thoughts  0 0  PHQ-9 Score  3 0  Difficult doing work/chores   Not difficult at all    BP Readings from Last 3 Encounters:  08/20/23 134/62  05/11/23 122/64  11/09/22 128/74    Physical Exam Vitals and nursing note reviewed.  Constitutional:      General: She is not in acute distress.    Appearance:  She is well-developed.  HENT:     Head: Normocephalic and atraumatic.  Eyes:     General: Lids are everted, no foreign bodies appreciated.     Extraocular Movements: Extraocular movements intact.   Pulmonary:     Effort: Pulmonary effort is normal. No respiratory distress.  Skin:    General: Skin is warm and dry.     Findings: No rash.  Neurological:     Mental Status: She is alert and oriented to person, place, and time.  Psychiatric:        Mood and Affect: Mood normal.        Behavior: Behavior normal.     Wt Readings from Last 3 Encounters:  08/20/23 175 lb (79.4 kg)  05/11/23 173 lb (78.5 kg)  12/16/22 167 lb (75.8 kg)    BP 134/62   Pulse 69   Ht 5\' 6"  (1.676 m)   Wt 175 lb (79.4 kg)   SpO2 97%   BMI 28.25 kg/m   Assessment and Plan:  Problem List Items Addressed This Visit   None Visit Diagnoses       Hordeolum externum of left lower eyelid    -  Primary   Relevant Medications   erythromycin ophthalmic ointment       No follow-ups on file.    Reubin Milan, MD [facility] [place]

## 2023-09-02 DIAGNOSIS — M25511 Pain in right shoulder: Secondary | ICD-10-CM | POA: Diagnosis not present

## 2023-09-02 DIAGNOSIS — K080 Exfoliation of teeth due to systemic causes: Secondary | ICD-10-CM | POA: Diagnosis not present

## 2023-09-13 ENCOUNTER — Ambulatory Visit (INDEPENDENT_AMBULATORY_CARE_PROVIDER_SITE_OTHER): Payer: Medicare Other | Admitting: Internal Medicine

## 2023-09-13 ENCOUNTER — Encounter: Payer: Self-pay | Admitting: Internal Medicine

## 2023-09-13 VITALS — BP 124/75 | HR 81 | Temp 97.9°F | Ht 66.0 in | Wt 172.0 lb

## 2023-09-13 DIAGNOSIS — J029 Acute pharyngitis, unspecified: Secondary | ICD-10-CM | POA: Diagnosis not present

## 2023-09-13 DIAGNOSIS — I10 Essential (primary) hypertension: Secondary | ICD-10-CM

## 2023-09-13 DIAGNOSIS — Z1231 Encounter for screening mammogram for malignant neoplasm of breast: Secondary | ICD-10-CM

## 2023-09-13 NOTE — Progress Notes (Signed)
 Date:  09/13/2023   Name:  Joanna Sanders   DOB:  1929/11/13   MRN:  [id number]   Chief Complaint: Hypertension and Sore Throat (Patient said she started yesterday with a sore throat, no fever, no chills, just feeling cold)  Hypertension This is a chronic problem. The problem is controlled. Pertinent negatives include no chest pain or headaches. Past treatments include diuretics. The current treatment provides moderate improvement.  Sore Throat  This is a new problem. The problem has been unchanged. Neither side of throat is experiencing more pain than the other. There has been no fever. Pertinent negatives include no coughing, ear pain, headaches, hoarse voice or trouble swallowing. She has tried nothing for the symptoms.    Review of Systems  Constitutional:  Negative for chills, fatigue and fever.  HENT:  Positive for sore throat. Negative for ear pain, hoarse voice and trouble swallowing.   Respiratory:  Negative for cough, chest tightness and wheezing.   Cardiovascular:  Negative for chest pain.  Neurological:  Negative for headaches.     Lab Results  Component Value Date   NA 138 05/11/2023   K 4.2 05/11/2023   CO2 25 05/11/2023   GLUCOSE 122 (H) 05/11/2023   BUN 20 05/11/2023   CREATININE 1.04 (H) 05/11/2023   CALCIUM 9.7 05/11/2023   EGFR 51 (L) 05/11/2023   GFRNONAA 59 (L) 07/15/2021   Lab Results  Component Value Date   CHOL 200 (H) 05/11/2023   HDL 48 05/11/2023   LDLCALC 115 (H) 05/11/2023   TRIG 210 (H) 05/11/2023   CHOLHDL 4.2 05/11/2023   Lab Results  Component Value Date   TSH 1.310 05/11/2023   Lab Results  Component Value Date   HGBA1C 5.8 (H) 05/07/2022   Lab Results  Component Value Date   WBC 6.2 05/11/2023   HGB 12.1 05/11/2023   HCT 36.2 05/11/2023   MCV 96 05/11/2023   PLT 235 05/11/2023   Lab Results  Component Value Date   ALT 15 05/11/2023   AST 21 05/11/2023   ALKPHOS 92 05/11/2023   BILITOT 0.5 05/11/2023   Lab  Results  Component Value Date   VD25OH 47 09/10/2022     Patient Active Problem List   Diagnosis Date Noted   Aortic atherosclerosis (HCC) 10/15/2020   S/P parathyroidectomy 10/15/2020   Anemia, vitamin B12 deficiency 06/24/2020   IPMN (intraductal papillary mucinous neoplasm) 06/06/2018   Postcholecystectomy diarrhea 06/06/2018   Plantar fascial fibromatosis 10/12/2017   Osteoarthritis of knee 10/12/2017   OSA (obstructive sleep apnea) 09/18/2016   Localized primary osteoarthritis of left shoulder region 06/18/2016   Abdominal pain, chronic, right upper quadrant 04/29/2016   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 Not 100% certain which antibiotic.     Hydrochlorothiazide     hypercalcemia   Prednisone Other (See Comments)    Gets really hyper    Past Surgical History:  Procedure Laterality Date   CARPAL TUNNEL RELEASE Right    CATARACT EXTRACTION     CHOLECYSTECTOMY  2013   PARATHYROIDECTOMY  08/21/2020  removed one gland   TOTAL SHOULDER REPLACEMENT Left 2013    Social History   Tobacco Use   Smoking status: Former    Current packs/day: 0.00    Types: Cigarettes    Quit date: 07/06/1996    Years since quitting: 27.2   Smokeless tobacco: Never  Vaping Use   Vaping status: Never Used  Substance Use Topics   Alcohol use: Yes    Alcohol/week: 3.0 standard drinks of alcohol    Types: 3 Glasses of wine per week   Drug use: Never     Medication list has been reviewed and updated.  Current Meds  Medication Sig    Calcium 200 MG TABS Take by mouth.   colestipol (COLESTID) 1 g tablet Take 1 g by mouth 2 (two) times daily. PRN   erythromycin ophthalmic ointment Place 1 Application into the left eye at bedtime.   esomeprazole (NEXIUM) 40 MG capsule Take 40 mg by mouth daily at 12 noon.   Ferrous Sulfate (IRON PO) Take by mouth daily.   furosemide (LASIX) 20 MG tablet TAKE 1 TABLET BY MOUTH EVERY DAY, TAKE 1 TO 2 DAILY AS NEEDED   Multiple Vitamin (MULTIVITAMIN) capsule Take 1 capsule by mouth daily.   Omega-3 Fatty Acids (FISH OIL) 500 MG CAPS Take by mouth.   traMADol (ULTRAM) 50 MG tablet Take 50 mg by mouth every 4 (four) hours as needed.       09/13/2023   11:06 AM 08/20/2023   10:29 AM 05/11/2023   10:50 AM 11/09/2022    4:58 PM  GAD 7 : Generalized Anxiety Score  Nervous, Anxious, on Edge 0 0 0 0  Control/stop worrying 0 0 0 0  Worry too much - different things 0 0 0 0  Trouble relaxing 0 0 0 0  Restless 0 0 0 0  Easily annoyed or irritable 0 0 0 0  Afraid - awful might happen 0 0 0 0  Total GAD 7 Score 0 0 0 0  Anxiety Difficulty Not difficult at all Not difficult at all Not difficult at all Not difficult at all       09/13/2023   11:05 AM 08/20/2023   10:29 AM 05/11/2023   10:50 AM  Depression screen PHQ 2/9  Decreased Interest 0 0 0  Down, Depressed, Hopeless 0 0 0  PHQ - 2 Score 0 0 0  Altered sleeping 0  1  Tired, decreased energy 1  2  Change in appetite 0  0  Feeling bad or failure about yourself  0  0  Trouble concentrating 0  0  Moving slowly or fidgety/restless 0  0  Suicidal thoughts 0  0  PHQ-9 Score 1  3  Difficult doing work/chores Not difficult at all      BP Readings from Last 3 Encounters:  09/13/23 124/75  08/20/23 134/62  05/11/23 122/64    Physical Exam Vitals and nursing note reviewed.  Constitutional:      General: She is not in acute distress.    Appearance: She is well-developed.  HENT:     Head: Normocephalic and atraumatic.     Nose: No  congestion.     Mouth/Throat:     Pharynx: No pharyngeal swelling, oropharyngeal exudate or posterior oropharyngeal erythema.  Eyes:     Conjunctiva/sclera: Conjunctivae normal.  Cardiovascular:     Rate and Rhythm: Normal rate and regular rhythm.     Heart sounds: No murmur heard. Pulmonary:  Effort: Pulmonary effort is normal. No respiratory distress.     Breath sounds: No wheezing or rhonchi.  Musculoskeletal:     Cervical back: Normal range of motion.  Skin:    General: Skin is warm and dry.     Findings: No rash.  Neurological:     Mental Status: She is alert and oriented to person, place, and time.  Psychiatric:        Mood and Affect: Mood normal.        Behavior: Behavior normal.     Wt Readings from Last 3 Encounters:  09/13/23 172 lb (78 kg)  08/20/23 175 lb (79.4 kg)  05/11/23 173 lb (78.5 kg)    BP 124/75   Pulse 81   Temp 97.9 F (36.6 C)   Ht 5\' 6"  (1.676 m)   Wt 172 lb (78 kg)   SpO2 96%   BMI 27.76 kg/m   Assessment and Plan:  Problem List Items Addressed This Visit       Unprioritized   Benign essential HTN - Primary (Chronic)   Controlled BP with normal exam. Current regimen is PRN lasxi. Will continue same medications; encourage continued reduced sodium diet.       Other Visit Diagnoses       Sore throat       no symptoms to suggest need for antibiotics or Flu/Covid testing tylenol and warm liquids as needed Zyrtec prn PND     Encounter for screening mammogram for breast cancer       Relevant Orders   MM 3D SCREENING MAMMOGRAM BILATERAL BREAST       No follow-ups on file.    Reubin Milan, MD [facility] [place]

## 2023-09-13 NOTE — Assessment & Plan Note (Signed)
 Controlled BP with normal exam. Current regimen is PRN lasxi. Will continue same medications; encourage continued reduced sodium diet.

## 2023-09-13 NOTE — Patient Instructions (Addendum)
 Call [facility] to schedule your mammogram at [phone].  Take Zyrtec as needed for drainage  Warm liquids and tylenol if needed for sore throat

## 2023-09-16 DIAGNOSIS — M810 Age-related osteoporosis without current pathological fracture: Secondary | ICD-10-CM | POA: Diagnosis not present

## 2023-09-16 DIAGNOSIS — Z9889 Other specified postprocedural states: Secondary | ICD-10-CM | POA: Diagnosis not present

## 2023-09-16 DIAGNOSIS — Z9089 Acquired absence of other organs: Secondary | ICD-10-CM | POA: Diagnosis not present

## 2023-09-20 DIAGNOSIS — M19071 Primary osteoarthritis, right ankle and foot: Secondary | ICD-10-CM | POA: Diagnosis not present

## 2023-09-20 DIAGNOSIS — M65871 Other synovitis and tenosynovitis, right ankle and foot: Secondary | ICD-10-CM | POA: Diagnosis not present

## 2023-09-20 DIAGNOSIS — M21371 Foot drop, right foot: Secondary | ICD-10-CM | POA: Diagnosis not present

## 2023-09-30 DIAGNOSIS — M25571 Pain in right ankle and joints of right foot: Secondary | ICD-10-CM | POA: Diagnosis not present

## 2023-10-11 DIAGNOSIS — M21371 Foot drop, right foot: Secondary | ICD-10-CM | POA: Diagnosis not present

## 2023-10-11 DIAGNOSIS — M65871 Other synovitis and tenosynovitis, right ankle and foot: Secondary | ICD-10-CM | POA: Diagnosis not present

## 2023-10-11 DIAGNOSIS — S96811A Strain of other specified muscles and tendons at ankle and foot level, right foot, initial encounter: Secondary | ICD-10-CM | POA: Diagnosis not present

## 2023-11-09 DIAGNOSIS — S96811A Strain of other specified muscles and tendons at ankle and foot level, right foot, initial encounter: Secondary | ICD-10-CM | POA: Diagnosis not present

## 2023-11-11 DIAGNOSIS — K080 Exfoliation of teeth due to systemic causes: Secondary | ICD-10-CM | POA: Diagnosis not present

## 2023-11-22 DIAGNOSIS — H5203 Hypermetropia, bilateral: Secondary | ICD-10-CM | POA: Diagnosis not present

## 2023-11-22 DIAGNOSIS — H04123 Dry eye syndrome of bilateral lacrimal glands: Secondary | ICD-10-CM | POA: Diagnosis not present

## 2023-11-22 DIAGNOSIS — H524 Presbyopia: Secondary | ICD-10-CM | POA: Diagnosis not present

## 2023-11-22 DIAGNOSIS — H52223 Regular astigmatism, bilateral: Secondary | ICD-10-CM | POA: Diagnosis not present

## 2023-12-01 DIAGNOSIS — L570 Actinic keratosis: Secondary | ICD-10-CM | POA: Diagnosis not present

## 2023-12-01 DIAGNOSIS — L821 Other seborrheic keratosis: Secondary | ICD-10-CM | POA: Diagnosis not present

## 2023-12-09 DIAGNOSIS — M25511 Pain in right shoulder: Secondary | ICD-10-CM | POA: Diagnosis not present

## 2023-12-13 DIAGNOSIS — K080 Exfoliation of teeth due to systemic causes: Secondary | ICD-10-CM | POA: Diagnosis not present

## 2023-12-16 DIAGNOSIS — M66271 Spontaneous rupture of extensor tendons, right ankle and foot: Secondary | ICD-10-CM | POA: Diagnosis not present

## 2023-12-16 DIAGNOSIS — M21371 Foot drop, right foot: Secondary | ICD-10-CM | POA: Diagnosis not present

## 2023-12-16 DIAGNOSIS — Z9181 History of falling: Secondary | ICD-10-CM | POA: Diagnosis not present

## 2023-12-16 DIAGNOSIS — S96811A Strain of other specified muscles and tendons at ankle and foot level, right foot, initial encounter: Secondary | ICD-10-CM | POA: Diagnosis not present

## 2023-12-22 ENCOUNTER — Ambulatory Visit: Payer: Medicare Other | Admitting: Emergency Medicine

## 2023-12-22 VITALS — Ht 66.0 in | Wt 171.0 lb

## 2023-12-22 DIAGNOSIS — Z0000 Encounter for general adult medical examination without abnormal findings: Secondary | ICD-10-CM | POA: Diagnosis not present

## 2023-12-22 NOTE — Patient Instructions (Signed)
 Joanna Sanders , Thank you for taking time out of your busy schedule to complete your Annual Wellness Visit with me. I enjoyed our conversation and look forward to speaking with you again next year. I, as well as your care team,  appreciate your ongoing commitment to your health goals. Please review the following plan we discussed and let me know if I can assist you in the future. Your Game plan/ To Do List    Referrals: None  Follow up Visits: Next Medicare AWV with our clinical staff: Not scheduled per patient's request   Have you seen your provider in the last 6 months (3 months if uncontrolled diabetes)? Yes Next Office Visit with your provider: 05/12/24 @ 10:40am with Dr. Gala Jubilee  Clinician Recommendations: Dr Gala Jubilee placed an order for a mammogram on 09/13/23, please call [phone] to schedule at your earliest convenience.  Aim for 30 minutes of exercise or brisk walking, 6-8 glasses of water, and 5 servings of fruits and vegetables each day.       This is a list of the screening recommended for you and due dates:  Health Maintenance  Topic Date Due   COVID-19 Vaccine (9 - 2024-25 season) 03/07/2023   DEXA scan (bone density measurement)  09/05/2023   Mammogram  12/10/2023   Flu Shot  02/04/2024   Medicare Annual Wellness Visit  12/21/2024   DTaP/Tdap/Td vaccine (2 - Td or Tdap) 09/25/2025   Pneumococcal Vaccine for age over 47  Completed   Zoster (Shingles) Vaccine  Completed   HPV Vaccine  Aged Out   Meningitis B Vaccine  Aged Out   Colon Cancer Screening  Discontinued    Advanced directives: (Copy Requested) Please bring a copy of your health care power of attorney and living will to the office to be added to your chart at your convenience. You can mail to [facility] [address] [place], [place] [address] or email to [email or web address] Advance Care Planning is important because it:  [x]  Makes sure you receive the medical care that is  consistent with your values, goals, and preferences  [x]  It provides guidance to your family and loved ones and reduces their decisional burden about whether or not they are making the right decisions based on your wishes.  Follow the link provided in your after visit summary or read over the paperwork we have mailed to you to help you started getting your Advance Directives in place. If you need assistance in completing these, please reach out to us  so that we can help you!  See attachments for Preventive Care and Fall Prevention Tips.   Fall Prevention in the Home, Adult Falls can cause injuries and affect people of all ages. There are many simple things that you can do to make your home safe and to help prevent falls. If you need it, ask for help making these changes. What actions can I take to prevent falls? General information Use good lighting in all rooms. Make sure to: Replace any light bulbs that burn out. Turn on lights if it is dark and use night-lights. Keep items that you use often in easy-to-reach places. Lower the shelves around your home if needed. Move furniture so that there are clear paths around it. Do not keep throw rugs or other things on the floor that can make you trip. If any of your floors are uneven, fix them. Add color or contrast paint or tape to clearly mark and help you see:  Grab bars or handrails. First and last steps of staircases. Where the edge of each step is. If you use a ladder or stepladder: Make sure that it is fully opened. Do not climb a closed ladder. Make sure the sides of the ladder are locked in place. Have someone hold the ladder while you use it. Know where your pets are as you move through your home. What can I do in the bathroom?     Keep the floor dry. Clean up any water that is on the floor right away. Remove soap buildup in the bathtub or shower. Buildup makes bathtubs and showers slippery. Use non-skid mats or decals on the  floor of the bathtub or shower. Attach bath mats securely with double-sided, non-slip rug tape. If you need to sit down while you are in the shower, use a non-slip stool. Install grab bars by the toilet and in the bathtub and shower. Do not use towel bars as grab bars. What can I do in the bedroom? Make sure that you have a light by your bed that is easy to reach. Do not use any sheets or blankets on your bed that hang to the floor. Have a firm bench or chair with side arms that you can use for support when you get dressed. What can I do in the kitchen? Clean up any spills right away. If you need to reach something above you, use a sturdy step stool that has a grab bar. Keep electrical cables out of the way. Do not use floor polish or wax that makes floors slippery. What can I do with my stairs? Do not leave anything on the stairs. Make sure that you have a light switch at the top and the bottom of the stairs. Have them installed if you do not have them. Make sure that there are handrails on both sides of the stairs. Fix handrails that are broken or loose. Make sure that handrails are as long as the staircases. Install non-slip stair treads on all stairs in your home if they do not have carpet. Avoid having throw rugs at the top or bottom of stairs, or secure the rugs with carpet tape to prevent them from moving. Choose a carpet design that does not hide the edge of steps on the stairs. Make sure that carpet is firmly attached to the stairs. Fix any carpet that is loose or worn. What can I do on the outside of my home? Use bright outdoor lighting. Repair the edges of walkways and driveways and fix any cracks. Clear paths of anything that can make you trip, such as tools or rocks. Add color or contrast paint or tape to clearly mark and help you see high doorway thresholds. Trim any bushes or trees on the main path into your home. Check that handrails are securely fastened and in good repair.  Both sides of all steps should have handrails. Install guardrails along the edges of any raised decks or porches. Have leaves, snow, and ice cleared regularly. Use sand, salt, or ice melt on walkways during winter months if you live where there is ice and snow. In the garage, clean up any spills right away, including grease or oil spills. What other actions can I take? Review your medicines with your health care provider. Some medicines can make you confused or feel dizzy. This can increase your chance of falling. Wear closed-toe shoes that fit well and support your feet. Wear shoes that have rubber soles and low  heels. Use a cane, walker, scooter, or crutches that help you move around if needed. Talk with your provider about other ways that you can decrease your risk of falls. This may include seeing a physical therapist to learn to do exercises to improve movement and strength. Where to find more information Centers for Disease Control and Prevention, STEADI: TonerPromos.no [employer] on Aging: BaseRingTones.pl National Institute on Aging: BaseRingTones.pl Contact a health care provider if: You are afraid of falling at home. You feel weak, drowsy, or dizzy at home. You fall at home. Get help right away if you: Lose consciousness or have trouble moving after a fall. Have a fall that causes a head injury. These symptoms may be an emergency. Get help right away. Call 911. Do not wait to see if the symptoms will go away. Do not drive yourself to the hospital. This information is not intended to replace advice given to you by your health care provider. Make sure you discuss any questions you have with your health care provider. Document Revised: 02/23/2022 Document Reviewed: 02/23/2022 Elsevier Patient Education  2024 [employer].

## 2023-12-22 NOTE — Progress Notes (Signed)
 Subjective:   Joanna Sanders is a 89 y.o. who presents for a Medicare Wellness preventive visit.  As a reminder, Annual Wellness Visits don't include a physical exam, and some assessments may be limited, especially if this visit is performed virtually. We may recommend an in-person follow-up visit with your provider if needed.  Visit Complete: Virtual I connected with  Raymund Calix Sciulli on 12/22/23 by a audio enabled telemedicine application and verified that I am speaking with the correct person using two identifiers.  Patient Location: Home  Provider Location: Home Office  I discussed the limitations of evaluation and management by telemedicine. The patient expressed understanding and agreed to proceed.  Vital Signs: Because this visit was a virtual/telehealth visit, some criteria may be missing or patient reported. Any vitals not documented were not able to be obtained and vitals that have been documented are patient reported.  VideoDeclined- This patient declined Librarian, academic. Therefore the visit was completed with audio only.  Persons Participating in Visit: Patient.  AWV Questionnaire: No: Patient Medicare AWV questionnaire was not completed prior to this visit.  Cardiac Risk Factors include: advanced age (>37men, >58 women);hypertension;dyslipidemia;Other (see comment), Risk factor comments: OSA (no cpap)     Objective:    Today's Vitals   12/22/23 1131  Weight: 171 lb (77.6 kg)  Height: 5' 6 (1.676 m)  PainSc: 2    Body mass index is 27.6 kg/m.     12/22/2023   11:48 AM 12/16/2022   11:34 AM 12/08/2021    1:13 PM 06/12/2020   10:29 AM 04/26/2019    2:24 PM 02/22/2017    2:34 PM 04/04/2015   11:01 AM  Advanced Directives  Does Patient Have a Medical Advance Directive? Yes No Yes Yes Yes Yes  Yes   Type of Estate agent of [place];Living will  Healthcare Power of [place];Living will Healthcare Power of  [place];Living will Healthcare Power of [place];Living will Healthcare Power of [place];Living will Healthcare Power of [place];Living will   Does patient want to make changes to medical advance directive? No - Patient declined        Copy of Healthcare Power of Attorney in Chart? No - copy requested  No - copy requested No - copy requested No - copy requested No - copy requested    Would patient like information on creating a medical advance directive?  No - Patient declined          Data saved with a previous flowsheet row definition    Current Medications (verified) Outpatient Encounter Medications as of 12/22/2023  Medication Sig   acetaminophen (TYLENOL) 500 MG tablet Take 1,000 mg by mouth every 6 (six) hours as needed.   Calcium Carbonate-Vit D-Min (CALTRATE 600+D PLUS PO) Take 2 tablets by mouth daily at 2 PM.   colestipol (COLESTID) 1 g tablet Take 1 g by mouth 2 (two) times daily. PRN (Patient taking differently: Take 1 g by mouth 2 (two) times daily. prn)   esomeprazole (NEXIUM) 40 MG capsule Take 40 mg by mouth daily at 12 noon.   Ferrous Sulfate (IRON PO) Take by mouth daily.   furosemide  (LASIX ) 20 MG tablet TAKE 1 TABLET BY MOUTH EVERY DAY, TAKE 1 TO 2 DAILY AS NEEDED   meloxicam (MOBIC) 15 MG tablet Take 15 mg by mouth daily as needed.   Multiple Vitamin (MULTIVITAMIN) capsule Take 1 capsule by mouth daily.   Omega-3 Fatty Acids (FISH OIL) 500 MG CAPS Take  by mouth.   traMADol  (ULTRAM ) 50 MG tablet Take 50 mg by mouth every 4 (four) hours as needed.   Calcium 200 MG TABS Take by mouth.   erythromycin  ophthalmic ointment Place 1 Application into the left eye at bedtime. (Patient not taking: Reported on 12/22/2023)   No facility-administered encounter medications on file as of 12/22/2023.    Allergies (verified) Nitrofuran derivatives, Cortisone, Hydrocodone, Hydrocortisone, Wound dressing adhesive, Amoxicillin , Hydrochlorothiazide, and Prednisone    History: Past Medical  History:  Diagnosis Date   COVID-19 10/19/2020   GERD (gastroesophageal reflux disease)    Hyperlipidemia    Hypertension    Knee joint effusion 10/12/2017   Sleep apnea    Past Surgical History:  Procedure Laterality Date   CARPAL TUNNEL RELEASE Right    CATARACT EXTRACTION     CHOLECYSTECTOMY  2013   PARATHYROIDECTOMY  08/21/2020   removed one gland   TOTAL SHOULDER REPLACEMENT Left 2013   Family History  Problem Relation Age of Onset   Hypertension Mother    Leukemia Mother    Heart failure Father    Breast cancer Daughter 50   Social History   Socioeconomic History   Marital status: Widowed    Spouse name: Not on file   Number of children: 2   Years of education: Not on file   Highest education level: Not on file  Occupational History   Occupation: Retired  Tobacco Use   Smoking status: Former    Current packs/day: 0.00    Average packs/day: 0.3 packs/day for 44.0 years (11.0 ttl pk-yrs)    Types: Cigarettes    Start date: 31    Quit date: 07/06/1996    Years since quitting: 27.4   Smokeless tobacco: Never  Vaping Use   Vaping status: Never Used  Substance and Sexual Activity   Alcohol use: Yes    Alcohol/week: 1.0 standard drink of alcohol    Types: 1 Glasses of wine per week    Comment: 1 glass of wine once per week   Drug use: Never   Sexual activity: Not Currently    Partners: Male  Other Topics Concern   Not on file  Social History Narrative   Pt lives alone   Social Drivers of Health   Financial Resource Strain: Low Risk  (12/22/2023)   Overall Financial Resource Strain (CARDIA)    Difficulty of Paying Living Expenses: Not hard at all  Food Insecurity: No Food Insecurity (12/22/2023)   Hunger Vital Sign    Worried About Running Out of Food in the Last Year: Never true    Ran Out of Food in the Last Year: Never true  Transportation Needs: No Transportation Needs (12/22/2023)   PRAPARE - Administrator, Civil Service (Medical): No     Lack of Transportation (Non-Medical): No  Physical Activity: Inactive (12/22/2023)   Exercise Vital Sign    Days of Exercise per Week: 0 days    Minutes of Exercise per Session: 0 min  Stress: No Stress Concern Present (12/22/2023)   [employer] of Occupational Health - Occupational Stress Questionnaire    Feeling of Stress: Not at all  Social Connections: Moderately Isolated (12/22/2023)   Social Connection and Isolation Panel    Frequency of Communication with Friends and Family: More than three times a week    Frequency of Social Gatherings with Friends and Family: More than three times a week    Attends Religious Services: Never    Active Member  of Clubs or Organizations: Yes    Attends Banker Meetings: More than 4 times per year    Marital Status: Widowed    Tobacco Counseling Counseling given: No    Clinical Intake:  Pre-visit preparation completed: Yes  Pain : 0-10 Pain Score: 2  Pain Type: Chronic pain Pain Location: Ankle Pain Orientation: Right Pain Descriptors / Indicators: Aching     BMI - recorded: 27.6 Nutritional Status: BMI 25 -29 Overweight Nutritional Risks: None Diabetes: No  Lab Results  Component Value Date   HGBA1C 5.8 (H) 05/07/2022   HGBA1C 5.8 (H) 04/13/2017     How often do you need to have someone help you when you read instructions, pamphlets, or other written materials from your doctor or pharmacy?: 1 - Never  Interpreter Needed?: No  Information entered by :: Jaunita Messier, CMA   Activities of Daily Living     12/22/2023   11:34 AM  In your present state of health, do you have any difficulty performing the following activities:  Hearing? 1  Comment wearing hearing aids  Vision? 0  Difficulty concentrating or making decisions? 0  Walking or climbing stairs? 1  Comment uses cane and ankle brace if needed  Dressing or bathing? 0  Doing errands, shopping? 0  Preparing Food and eating ? N  Using the Toilet? N   In the past six months, have you accidently leaked urine? Y  Comment wears panty liner as needed  Do you have problems with loss of bowel control? N  Managing your Medications? N  Managing your Finances? N  Housekeeping or managing your Housekeeping? N    Patient Care Team: Sheron Dixons, MD as PCP - General (Internal Medicine) Emilie Harden Joselyn Nicely, MD as Referring Physician (Endocrinology) Rex Castor, MD as Referring Physician (Orthopedic Surgery) Loni Rm, MD as Referring Physician (Orthopedic Surgery) Marit Sides Linn Rich, OD (Optometry) Cacchio, Patrick B, PA-C (Endocrinology)  I have updated your Care Teams any recent Medical Services you may have received from other providers in the past year.     Assessment:   This is a routine wellness examination for [place].  Hearing/Vision screen Hearing Screening - Comments:: Wears hearing aids Vision Screening - Comments:: Gets routine eye exams, [facility], [place]   Goals Addressed             This Visit's Progress    Patient Stated       Lose a few lbs       Depression Screen     12/22/2023   11:45 AM 09/13/2023   11:05 AM 08/20/2023   10:29 AM 05/11/2023   10:50 AM 12/16/2022   11:33 AM 11/09/2022    4:58 PM 07/08/2022    9:51 AM  PHQ 2/9 Scores  PHQ - 2 Score 0 0 0 0 0 0 0  PHQ- 9 Score 0 1  3 0 0 0    Fall Risk     12/22/2023   11:51 AM 09/13/2023   11:05 AM 08/20/2023   10:29 AM 05/11/2023   10:50 AM 12/16/2022   11:35 AM  Fall Risk   Falls in the past year? 0 0 0 0 1  Number falls in past yr: 0 0 0 0 0  Injury with Fall? 0 0 0 0 0  Risk for fall due to : Impaired balance/gait;Impaired mobility No Fall Risks No Fall Risks No Fall Risks History of fall(s)  Follow up Education provided;Falls evaluation  completed;Falls prevention discussed Falls evaluation completed Falls evaluation completed Falls evaluation completed Falls evaluation completed;Falls prevention discussed    MEDICARE RISK  AT HOME:  Medicare Risk at Home Any stairs in or around the home?: Yes If so, are there any without handrails?: No Home free of loose throw rugs in walkways, pet beds, electrical cords, etc?: Yes Adequate lighting in your home to reduce risk of falls?: Yes Life alert?: No Use of a cane, walker or w/c?: Yes (uses cane and ankle brace prn) Grab bars in the bathroom?: No Shower chair or bench in shower?: Yes Elevated toilet seat or a handicapped toilet?: Yes  TIMED UP AND GO:  Was the test performed?  No  Cognitive Function: 6CIT completed        12/22/2023   11:52 AM 12/16/2022   11:38 AM 06/12/2020   10:33 AM 04/26/2019    2:28 PM 04/12/2018   10:30 AM  6CIT Screen  What Year? 0 points 0 points 0 points 0 points 0 points  What month? 0 points 0 points 0 points 0 points 0 points  What time? 0 points 0 points 0 points 0 points 0 points  Count back from 20 0 points 0 points 0 points 0 points 0 points  Months in reverse 0 points 0 points 0 points 0 points 0 points  Repeat phrase 0 points 0 points 0 points 0 points 0 points  Total Score 0 points 0 points 0 points 0 points 0 points    Immunizations Immunization History  Administered Date(s) Administered   Fluad Quad(high Dose 65+) 04/10/2019, 04/08/2020, 05/07/2022   Fluad Trivalent(High Dose 65+) 05/11/2023   Influenza, High Dose Seasonal PF 04/13/2017, 04/12/2018, 04/08/2020   Influenza,inj,Quad PF,6+ Mos 04/06/2016   Influenza-Unspecified 02/18/2015, 05/02/2021   PFIZER Comirnaty(Gray Top)Covid-19 Tri-Sucrose Vaccine 07/18/2019, 08/18/2019, 04/25/2020   PFIZER(Purple Top)SARS-COV-2 Vaccination 07/17/2019, 08/07/2019, 04/25/2020, 05/07/2020   Pfizer Covid-19 Vaccine Bivalent Booster 5y-11y 04/30/2021   Pneumococcal Conjugate-13 03/20/2014   Pneumococcal Polysaccharide-23 07/07/2005   Tdap 09/26/2015   Zoster Recombinant(Shingrix) 06/23/2018, 09/08/2018   Zoster, Live 07/08/2007    Screening Tests Health Maintenance   Topic Date Due   COVID-19 Vaccine (9 - 2024-25 season) 03/07/2023   DEXA SCAN  09/05/2023   MAMMOGRAM  12/10/2023   INFLUENZA VACCINE  02/04/2024   Medicare Annual Wellness (AWV)  12/21/2024   DTaP/Tdap/Td (2 - Td or Tdap) 09/25/2025   Pneumococcal Vaccine: 50+ Years  Completed   Zoster Vaccines- Shingrix  Completed   HPV VACCINES  Aged Out   Meningococcal B Vaccine  Aged Out   Colonoscopy  Discontinued    Health Maintenance  Health Maintenance Due  Topic Date Due   COVID-19 Vaccine (9 - 2024-25 season) 03/07/2023   DEXA SCAN  09/05/2023   MAMMOGRAM  12/10/2023   Health Maintenance Items Addressed: See Nurse Notes at the end of this note  Additional Screening:  Vision Screening: Recommended annual ophthalmology exams for early detection of glaucoma and other disorders of the eye. Would you like a referral to an eye doctor? No    Dental Screening: Recommended annual dental exams for proper oral hygiene  Community Resource Referral / Chronic Care Management: CRR required this visit?  No   CCM required this visit?  No   Plan:    I have personally reviewed and noted the following in the patient's chart:   Medical and social history Use of alcohol, tobacco or illicit drugs  Current medications and supplements including opioid prescriptions. Patient  is currently taking opioid prescriptions. Information provided to patient regarding non-opioid alternatives. Patient advised to discuss non-opioid treatment plan with their provider. Functional ability and status Nutritional status Physical activity Advanced directives List of other physicians Hospitalizations, surgeries, and ER visits in previous 12 months Vitals Screenings to include cognitive, depression, and falls Referrals and appointments  In addition, I have reviewed and discussed with patient certain preventive protocols, quality metrics, and best practice recommendations. A written personalized care plan for  preventive services as well as general preventive health recommendations were provided to patient.   Jaunita Messier, CMA   12/22/2023   After Visit Summary: (Mail) Due to this being a telephonic visit, the after visit summary with patients personalized plan was offered to patient via mail   Notes:  Gave phone# to schedule MMG (ordered 09/13/23) DEXA followed by Endocrinology at [facility] Covid vaccine Patient declined to make AWV appointment for 2026. Patient states that she thinks it is a duplication of services and that she doesn't need it.

## 2024-02-09 ENCOUNTER — Ambulatory Visit
Admission: RE | Admit: 2024-02-09 | Discharge: 2024-02-09 | Disposition: A | Discharge status: Home / Self Care | Source: Ambulatory Visit | Attending: Internal Medicine | Admitting: Internal Medicine

## 2024-02-09 DIAGNOSIS — Z1231 Encounter for screening mammogram for malignant neoplasm of breast: Secondary | ICD-10-CM | POA: Insufficient documentation

## 2024-03-30 DIAGNOSIS — M19011 Primary osteoarthritis, right shoulder: Secondary | ICD-10-CM | POA: Diagnosis not present

## 2024-04-19 DIAGNOSIS — K080 Exfoliation of teeth due to systemic causes: Secondary | ICD-10-CM | POA: Diagnosis not present

## 2024-05-12 ENCOUNTER — Ambulatory Visit (INDEPENDENT_AMBULATORY_CARE_PROVIDER_SITE_OTHER): Payer: Self-pay | Admitting: Internal Medicine

## 2024-05-12 ENCOUNTER — Encounter: Payer: Self-pay | Admitting: Internal Medicine

## 2024-05-12 VITALS — BP 116/60 | HR 76 | Temp 97.8°F | Ht 66.0 in | Wt 168.8 lb

## 2024-05-12 DIAGNOSIS — K219 Gastro-esophageal reflux disease without esophagitis: Secondary | ICD-10-CM

## 2024-05-12 DIAGNOSIS — Z23 Encounter for immunization: Secondary | ICD-10-CM | POA: Diagnosis not present

## 2024-05-12 DIAGNOSIS — Z0000 Encounter for general adult medical examination without abnormal findings: Secondary | ICD-10-CM | POA: Diagnosis not present

## 2024-05-12 DIAGNOSIS — I10 Essential (primary) hypertension: Secondary | ICD-10-CM | POA: Diagnosis not present

## 2024-05-12 DIAGNOSIS — E782 Mixed hyperlipidemia: Secondary | ICD-10-CM

## 2024-05-12 DIAGNOSIS — M810 Age-related osteoporosis without current pathological fracture: Secondary | ICD-10-CM

## 2024-05-12 DIAGNOSIS — R7303 Prediabetes: Secondary | ICD-10-CM

## 2024-05-12 DIAGNOSIS — G4733 Obstructive sleep apnea (adult) (pediatric): Secondary | ICD-10-CM | POA: Diagnosis not present

## 2024-05-12 NOTE — Assessment & Plan Note (Signed)
 Currently taking Nexium  with minimal reflux symptoms. Patient denies red flag symptoms - no melena, weight loss, dysphagia. Will maintain current management.

## 2024-05-12 NOTE — Assessment & Plan Note (Signed)
 Followed by [facility] Dr. Allen DEXA done 2024 - will be due next year PTH and calcium in March were normal.

## 2024-05-12 NOTE — Assessment & Plan Note (Signed)
 Managed with diet only. Lab Results  Component Value Date   LDLCALC 115 (H) 05/11/2023

## 2024-05-12 NOTE — Progress Notes (Signed)
 Date:  05/12/2024   Name:  Joanna Sanders   DOB:  June 25, 1930   MRN:  [id number]   Chief Complaint: Annual Exam Joanna Sanders is a 89 y.o. female who presents today for her Complete Annual Exam. She feels well. She reports exercising daily. She reports she is sleeping fairly well. Breast complaints none.  Health Maintenance  Topic Date Due   COVID-19 Vaccine (9 - 2025-26 season) 03/06/2024   DEXA scan (bone density measurement)  09/09/2024   Medicare Annual Wellness Visit  12/21/2024   Breast Cancer Screening  02/08/2025   DTaP/Tdap/Td vaccine (2 - Td or Tdap) 09/25/2025   Pneumococcal Vaccine for age over 16  Completed   Flu Shot  Completed   Zoster (Shingles) Vaccine  Completed   Meningitis B Vaccine  Aged Out   Colon Cancer Screening  Discontinued    Hypertension This is a chronic problem. The problem is controlled. Pertinent negatives include no chest pain, headaches, palpitations or shortness of breath. Past treatments include diuretics. The current treatment provides significant improvement.  Gastroesophageal Reflux She complains of heartburn. She reports no abdominal pain, no chest pain, no coughing or no wheezing. This is a recurrent problem. The problem occurs occasionally. Pertinent negatives include no fatigue. She has tried a PPI for the symptoms.    Review of Systems  Constitutional:  Negative for fatigue and unexpected weight change.  HENT:  Negative for trouble swallowing.   Eyes:  Negative for visual disturbance.  Respiratory:  Negative for cough, chest tightness, shortness of breath and wheezing.   Cardiovascular:  Negative for chest pain, palpitations and leg swelling.  Gastrointestinal:  Positive for heartburn. Negative for abdominal pain, constipation and diarrhea.  Genitourinary:  Negative for frequency and urgency.  Musculoskeletal:  Negative for myalgias. Arthralgias: right shoulder OA and right ankle tendon disruption. Skin:  Negative for color  change and rash.  Neurological:  Negative for dizziness, weakness, light-headedness and headaches.  Psychiatric/Behavioral:  Negative for dysphoric mood and sleep disturbance. The patient is not nervous/anxious.      Lab Results  Component Value Date   NA 138 05/11/2023   K 4.2 05/11/2023   CO2 25 05/11/2023   GLUCOSE 122 (H) 05/11/2023   BUN 20 05/11/2023   CREATININE 1.04 (H) 05/11/2023   CALCIUM 9.7 05/11/2023   EGFR 51 (L) 05/11/2023   GFRNONAA 59 (L) 07/15/2021   Lab Results  Component Value Date   CHOL 200 (H) 05/11/2023   HDL 48 05/11/2023   LDLCALC 115 (H) 05/11/2023   TRIG 210 (H) 05/11/2023   CHOLHDL 4.2 05/11/2023   Lab Results  Component Value Date   TSH 1.310 05/11/2023   Lab Results  Component Value Date   HGBA1C 5.8 (H) 05/07/2022   Lab Results  Component Value Date   WBC 6.2 05/11/2023   HGB 12.1 05/11/2023   HCT 36.2 05/11/2023   MCV 96 05/11/2023   PLT 235 05/11/2023   Lab Results  Component Value Date   ALT 15 05/11/2023   AST 21 05/11/2023   ALKPHOS 92 05/11/2023   BILITOT 0.5 05/11/2023   Lab Results  Component Value Date   VD25OH 47 09/10/2022     Patient Active Problem List   Diagnosis Date Noted   Aortic atherosclerosis 10/15/2020   S/P parathyroidectomy 10/15/2020   Anemia, vitamin B12 deficiency 06/24/2020   IPMN (intraductal papillary mucinous neoplasm) 06/06/2018   Postcholecystectomy diarrhea 06/06/2018   Plantar fascial fibromatosis 10/12/2017  Osteoarthritis of knee 10/12/2017   OSA (obstructive sleep apnea) 09/18/2016   Abdominal pain, chronic, right upper quadrant 04/29/2016   Extrahepatic biloma 04/15/2016   Age-related osteoporosis without current pathological fracture 04/06/2016   Allergic rhinitis 01/24/2015   Benign essential HTN 01/24/2015   Hyperlipidemia, mixed 01/24/2015   Gastroesophageal reflux disease 01/24/2015   Avitaminosis D 01/24/2015    Allergies  Allergen Reactions   Nitrofuran Derivatives  Diarrhea   Cortisone Other (See Comments)   Hydrocodone Itching    hydrocodone   Hydrocortisone Other (See Comments)    hydrocortisone   Wound Dressing Adhesive Other (See Comments)   Amoxicillin  Itching    Believes that was amoxicillin . Not 100% certain which antibiotic.     Hydrochlorothiazide     hypercalcemia   Prednisone  Other (See Comments)    Gets really hyper    Past Surgical History:  Procedure Laterality Date   CARPAL TUNNEL RELEASE Right    CATARACT EXTRACTION     CHOLECYSTECTOMY  2013   PARATHYROIDECTOMY  08/21/2020   removed one gland   TOTAL SHOULDER REPLACEMENT Left 2013    Social History   Tobacco Use   Smoking status: Former    Current packs/day: 0.00    Average packs/day: 0.3 packs/day for 44.0 years (11.0 ttl pk-yrs)    Types: Cigarettes    Start date: 58    Quit date: 07/06/1996    Years since quitting: 27.8   Smokeless tobacco: Never  Vaping Use   Vaping status: Never Used  Substance Use Topics   Alcohol use: Yes    Alcohol/week: 1.0 standard drink of alcohol    Types: 1 Glasses of wine per week    Comment: 1 glass of wine once per week   Drug use: Never     Medication list has been reviewed and updated.  Current Meds  Medication Sig   acetaminophen (TYLENOL) 500 MG tablet Take 1,000 mg by mouth every 6 (six) hours as needed.   Calcium 200 MG TABS Take by mouth.   Calcium Carbonate-Vit D-Min (CALTRATE 600+D PLUS PO) Take 2 tablets by mouth daily at 2 PM.   colestipol (COLESTID) 1 g tablet Take 1 g by mouth 2 (two) times daily. PRN (Patient taking differently: Take 1 g by mouth 2 (two) times daily. prn)   esomeprazole (NEXIUM) 40 MG capsule Take 40 mg by mouth daily at 12 noon.   Ferrous Sulfate (IRON PO) Take by mouth daily.   furosemide  (LASIX ) 20 MG tablet TAKE 1 TABLET BY MOUTH EVERY DAY, TAKE 1 TO 2 DAILY AS NEEDED   meloxicam (MOBIC) 15 MG tablet Take 15 mg by mouth daily as needed.   Multiple Vitamin (MULTIVITAMIN) capsule Take 1  capsule by mouth daily.   Omega-3 Fatty Acids (FISH OIL) 500 MG CAPS Take by mouth.   traMADol  (ULTRAM ) 50 MG tablet Take 50 mg by mouth every 4 (four) hours as needed.   [DISCONTINUED] erythromycin  ophthalmic ointment Place 1 Application into the left eye at bedtime.       05/12/2024   10:39 AM 09/13/2023   11:06 AM 08/20/2023   10:29 AM 05/11/2023   10:50 AM  GAD 7 : Generalized Anxiety Score  Nervous, Anxious, on Edge 0 0 0 0  Control/stop worrying 0 0 0 0  Worry too much - different things 1 0 0 0  Trouble relaxing 0 0 0 0  Restless 0 0 0 0  Easily annoyed or irritable 0 0 0 0  Afraid - awful might happen 0 0 0 0  Total GAD 7 Score 1 0 0 0  Anxiety Difficulty Not difficult at all Not difficult at all Not difficult at all Not difficult at all       05/12/2024   10:38 AM 12/22/2023   11:45 AM 09/13/2023   11:05 AM  Depression screen PHQ 2/9  Decreased Interest 0 0 0  Down, Depressed, Hopeless 0 0 0  PHQ - 2 Score 0 0 0  Altered sleeping 1 0 0  Tired, decreased energy 1 0 1  Change in appetite 0 0 0  Feeling bad or failure about yourself  0 0 0  Trouble concentrating 0 0 0  Moving slowly or fidgety/restless 0 0 0  Suicidal thoughts 0 0 0  PHQ-9 Score 2 0  1   Difficult doing work/chores Not difficult at all  Not difficult at all     Data saved with a previous flowsheet row definition    BP Readings from Last 3 Encounters:  05/12/24 116/60  09/13/23 124/75  08/20/23 134/62    Physical Exam Vitals and nursing note reviewed.  Constitutional:      General: She is not in acute distress.    Appearance: She is well-developed.  HENT:     Head: Normocephalic and atraumatic.     Right Ear: Tympanic membrane and ear canal normal.     Left Ear: Tympanic membrane and ear canal normal.     Nose:     Right Sinus: No maxillary sinus tenderness.     Left Sinus: No maxillary sinus tenderness.  Eyes:     General: No scleral icterus.       Right eye: No discharge.        Left  eye: No discharge.     Conjunctiva/sclera: Conjunctivae normal.  Neck:     Thyroid : No thyromegaly.     Vascular: No carotid bruit.  Cardiovascular:     Rate and Rhythm: Normal rate and regular rhythm.     Pulses: Normal pulses.     Heart sounds: Normal heart sounds.  Pulmonary:     Effort: Pulmonary effort is normal. No respiratory distress.     Breath sounds: No wheezing.  Abdominal:     General: Bowel sounds are normal.     Palpations: Abdomen is soft.     Tenderness: There is no abdominal tenderness.  Musculoskeletal:     Right shoulder: Tenderness present. Decreased range of motion.     Cervical back: Normal range of motion. No erythema.     Right lower leg: No edema.     Left lower leg: No edema.     Right ankle: Decreased range of motion (posterior splint in place).  Lymphadenopathy:     Cervical: No cervical adenopathy.  Skin:    General: Skin is warm and dry.     Capillary Refill: Capillary refill takes less than 2 seconds.     Findings: No rash.  Neurological:     Mental Status: She is alert and oriented to person, place, and time.     Cranial Nerves: No cranial nerve deficit.     Sensory: No sensory deficit.     Deep Tendon Reflexes: Reflexes are normal and symmetric.  Psychiatric:        Attention and Perception: Attention normal.        Mood and Affect: Mood normal.     Wt Readings from Last 3 Encounters:  05/12/24 168 lb  12.8 oz (76.6 kg)  12/22/23 171 lb (77.6 kg)  09/13/23 172 lb (78 kg)    BP 116/60   Pulse 76   Temp 97.8 F (36.6 C) (Oral)   Ht 5' 6 (1.676 m)   Wt 168 lb 12.8 oz (76.6 kg)   SpO2 98%   BMI 27.25 kg/m   Assessment and Plan:  Problem List Items Addressed This Visit       Unprioritized   Benign essential HTN (Chronic)   Well controlled blood pressure today. Current regimen is lifestyle changes and prn furosemide . No medication side effects noted.        Relevant Orders   CBC with Differential/Platelet    Comprehensive metabolic panel with GFR   TSH   Urinalysis, Routine w reflex microscopic   Hyperlipidemia, mixed (Chronic)   Managed with diet only. Lab Results  Component Value Date   LDLCALC 115 (H) 05/11/2023         Relevant Orders   Lipid panel   Gastroesophageal reflux disease (Chronic)   Currently taking Nexium with minimal reflux symptoms. Patient denies red flag symptoms - no melena, weight loss, dysphagia. Will maintain current management.       Relevant Orders   CBC with Differential/Platelet   Age-related osteoporosis without current pathological fracture (Chronic)   Followed by [facility] Dr. Allen DEXA done 2024 - will be due next year PTH and calcium in March were normal.      OSA (obstructive sleep apnea) (Chronic)   Relevant Orders   CBC with Differential/Platelet   Other Visit Diagnoses       Annual physical exam    -  Primary   MM and DEXA up to date CRC screening d/c'd no immunizations needed     Prediabetes       continue healthy diet and exercise   Relevant Orders   Hemoglobin A1c     Encounter for immunization       Relevant Orders   Flu vaccine HIGH DOSE PF(Fluzone Trivalent) (Completed)      She is moving to a retirement home in [place], [place] to be near her daughter in the next few weeks.  She will see a new PCP there.  No follow-ups on file.    Leita HILARIO Adie, MD [facility] [place]

## 2024-05-12 NOTE — Assessment & Plan Note (Signed)
 Well controlled blood pressure today. Current regimen is lifestyle changes and prn furosemide . No medication side effects noted.

## 2024-05-13 LAB — CBC WITH DIFFERENTIAL/PLATELET
Basophils Absolute: 0.1 x10E3/uL (ref 0.0–0.2)
Basos: 1 %
EOS (ABSOLUTE): 0.1 x10E3/uL (ref 0.0–0.4)
Eos: 2 %
Hematocrit: 35.6 % (ref 34.0–46.6)
Hemoglobin: 12.2 g/dL (ref 11.1–15.9)
Immature Grans (Abs): 0 x10E3/uL (ref 0.0–0.1)
Immature Granulocytes: 0 %
Lymphocytes Absolute: 2 x10E3/uL (ref 0.7–3.1)
Lymphs: 35 %
MCH: 32.4 pg (ref 26.6–33.0)
MCHC: 34.3 g/dL (ref 31.5–35.7)
MCV: 95 fL (ref 79–97)
Monocytes Absolute: 0.7 x10E3/uL (ref 0.1–0.9)
Monocytes: 13 %
Neutrophils Absolute: 2.8 x10E3/uL (ref 1.4–7.0)
Neutrophils: 49 %
Platelets: 256 x10E3/uL (ref 150–450)
RBC: 3.76 x10E6/uL — ABNORMAL LOW (ref 3.77–5.28)
RDW: 13.1 % (ref 11.7–15.4)
WBC: 5.7 x10E3/uL (ref 3.4–10.8)

## 2024-05-13 LAB — LIPID PANEL
Chol/HDL Ratio: 3.5 ratio (ref 0.0–4.4)
Cholesterol, Total: 194 mg/dL (ref 100–199)
HDL: 55 mg/dL (ref >39)
LDL Chol Calc (NIH): 109 mg/dL — ABNORMAL HIGH (ref 0–99)
Triglycerides: 173 mg/dL — ABNORMAL HIGH (ref 0–149)
VLDL Cholesterol Cal: 30 mg/dL (ref 5–40)

## 2024-05-13 LAB — URINALYSIS, ROUTINE W REFLEX MICROSCOPIC
Bilirubin, UA: NEGATIVE
Glucose, UA: NEGATIVE
Ketones, UA: NEGATIVE
Leukocytes,UA: NEGATIVE
Nitrite, UA: NEGATIVE
Protein,UA: NEGATIVE
RBC, UA: NEGATIVE
Specific Gravity, UA: 1.011 (ref 1.005–1.030)
Urobilinogen, Ur: 0.2 mg/dL (ref 0.2–1.0)
pH, UA: 6.5 (ref 5.0–7.5)

## 2024-05-13 LAB — COMPREHENSIVE METABOLIC PANEL WITH GFR
ALT: 17 IU/L (ref 0–32)
AST: 18 IU/L (ref 0–40)
Albumin: 4.6 g/dL (ref 3.6–4.6)
Alkaline Phosphatase: 107 IU/L (ref 48–129)
BUN/Creatinine Ratio: 14 (ref 12–28)
BUN: 12 mg/dL (ref 10–36)
Bilirubin Total: 0.4 mg/dL (ref 0.0–1.2)
CO2: 24 mmol/L (ref 20–29)
Calcium: 9.4 mg/dL (ref 8.7–10.3)
Chloride: 103 mmol/L (ref 96–106)
Creatinine, Ser: 0.88 mg/dL (ref 0.57–1.00)
Globulin, Total: 2 g/dL (ref 1.5–4.5)
Glucose: 108 mg/dL — ABNORMAL HIGH (ref 70–99)
Potassium: 4 mmol/L (ref 3.5–5.2)
Sodium: 139 mmol/L (ref 134–144)
Total Protein: 6.6 g/dL (ref 6.0–8.5)
eGFR: 62 mL/min/1.73 (ref >59)

## 2024-05-13 LAB — HEMOGLOBIN A1C
Est. average glucose Bld gHb Est-mCnc: 123 mg/dL
Hgb A1c MFr Bld: 5.9 % — ABNORMAL HIGH (ref 4.8–5.6)

## 2024-05-13 LAB — TSH: TSH: 1.61 u[IU]/mL (ref 0.450–4.500)

## 2024-05-14 ENCOUNTER — Ambulatory Visit: Payer: Self-pay | Admitting: Internal Medicine

## 2024-05-16 ENCOUNTER — Other Ambulatory Visit: Payer: Self-pay | Admitting: Internal Medicine

## 2024-05-16 NOTE — Telephone Encounter (Unsigned)
 Copied from CRM #[id number]. Topic: Clinical - Medication Refill >> May 16, 2024  1:49 PM Victoria B wrote: Medication: colestipol 1MG  [address] Dr that prescribed is retired  Has the patient contacted their pharmacy? no  This is the patient's preferred pharmacy:  [id number] - [place], [place] - [address] [place] [address] Phone: [phone] Fax: [phone]  Is this the correct pharmacy for this prescription? yes  Has the prescription been filled recently? no  Is the patient out of the medication? yes  Has the patient been seen for an appointment in the last year OR does the patient have an upcoming appointment? yes  Can we respond through MyChart? no  Agent: Please be advised that Rx refills may take up to 3 business days. We ask that you follow-up with your pharmacy.

## 2024-05-18 NOTE — Telephone Encounter (Signed)
 Requested medication (s) are due for refill today: yes  Requested medication (s) are on the active medication list: yes  Last refill:  06/06/18  Future visit scheduled: no  Notes to clinic:  historical medication     Requested Prescriptions  Pending Prescriptions Disp Refills   colestipol (COLESTID) 1 g tablet      Sig: Take 1 tablet (1 g total) by mouth 2 (two) times daily. PRN     Cardiovascular:  Antilipid - Bile Acid Sequestrants Failed - 05/18/2024  1:40 PM      Failed - Lipid Panel in normal range within the last 12 months    Cholesterol, Total  Date Value Ref Range Status  05/12/2024 194 100 - 199 mg/dL Final   LDL Chol Calc (NIH)  Date Value Ref Range Status  05/12/2024 109 (H) 0 - 99 mg/dL Final   HDL  Date Value Ref Range Status  05/12/2024 55 >39 mg/dL Final   Triglycerides  Date Value Ref Range Status  05/12/2024 173 (H) 0 - 149 mg/dL Final         Passed - Valid encounter within last 12 months    Recent Outpatient Visits           6 days ago Annual physical exam   [place] [facility] at [facility], Leita DEL, MD   8 months ago Benign essential HTN   [place] [facility] at [facility], Leita DEL, MD   9 months ago Hordeolum externum of left lower eyelid   [facility] at [facility], Leita DEL, MD

## 2024-05-19 ENCOUNTER — Other Ambulatory Visit: Payer: Self-pay | Admitting: Internal Medicine

## 2024-05-19 MED ORDER — COLESTIPOL HCL 1 G PO TABS: 1.0000 g | ORAL_TABLET | Freq: Two times a day (BID) | ORAL | 0 refills | Status: AC

## 2024-05-22 NOTE — Telephone Encounter (Signed)
 Duplicate request, too soon for refill.  Requested Prescriptions  Pending Prescriptions Disp Refills   colestipol (COLESTID) 1 g tablet [Pharmacy Med Name: COLESTIPOL 1GM TABLETS] 180 tablet     Sig: TAKE 1 TABLET(1 GRAM) BY MOUTH TWICE DAILY AS NEEDED     Cardiovascular:  Antilipid - Bile Acid Sequestrants Failed - 05/22/2024 11:32 AM      Failed - Lipid Panel in normal range within the last 12 months    Cholesterol, Total  Date Value Ref Range Status  05/12/2024 194 100 - 199 mg/dL Final   LDL Chol Calc (NIH)  Date Value Ref Range Status  05/12/2024 109 (H) 0 - 99 mg/dL Final   HDL  Date Value Ref Range Status  05/12/2024 55 >39 mg/dL Final   Triglycerides  Date Value Ref Range Status  05/12/2024 173 (H) 0 - 149 mg/dL Final         Passed - Valid encounter within last 12 months    Recent Outpatient Visits           1 week ago Annual physical exam   [place] [facility] at [facility], Leita DEL, MD   8 months ago Benign essential HTN   [place] [facility] at [facility], Leita DEL, MD   9 months ago Hordeolum externum of left lower eyelid   [facility] at [facility], Leita DEL, MD

## 2024-06-08 DIAGNOSIS — M19011 Primary osteoarthritis, right shoulder: Secondary | ICD-10-CM | POA: Diagnosis not present
